# Patient Record
Sex: Male | Born: 1955
Health system: Southern US, Community
[De-identification: ages and names within clinical notes are randomized; demographics above are authoritative.]

## PROBLEM LIST (undated history)

## (undated) DIAGNOSIS — Z720 Tobacco use: Secondary | ICD-10-CM

## (undated) DIAGNOSIS — J84112 Idiopathic pulmonary fibrosis: Secondary | ICD-10-CM

## (undated) DIAGNOSIS — J439 Emphysema, unspecified: Secondary | ICD-10-CM

## (undated) DIAGNOSIS — M1990 Unspecified osteoarthritis, unspecified site: Secondary | ICD-10-CM

## (undated) DIAGNOSIS — E7800 Pure hypercholesterolemia, unspecified: Secondary | ICD-10-CM

## (undated) DIAGNOSIS — M549 Dorsalgia, unspecified: Secondary | ICD-10-CM

## (undated) DIAGNOSIS — G8929 Other chronic pain: Secondary | ICD-10-CM

## (undated) HISTORY — PX: APPENDECTOMY: SHX54

## (undated) HISTORY — DX: Idiopathic pulmonary fibrosis: J84.112

## (undated) HISTORY — PX: NECK SURGERY: SHX720

## (undated) HISTORY — PX: BACK SURGERY: SHX140

---

## 1997-10-25 ENCOUNTER — Ambulatory Visit (HOSPITAL_COMMUNITY): Admission: RE | Admit: 1997-10-25 | Discharge: 1997-10-25 | Payer: Self-pay | Admitting: *Deleted

## 1997-11-14 ENCOUNTER — Inpatient Hospital Stay (HOSPITAL_COMMUNITY): Admission: RE | Admit: 1997-11-14 | Discharge: 1997-11-15 | Payer: Self-pay | Admitting: *Deleted

## 1997-12-10 ENCOUNTER — Other Ambulatory Visit: Admission: RE | Admit: 1997-12-10 | Discharge: 1997-12-10 | Payer: Self-pay | Admitting: *Deleted

## 1998-01-08 ENCOUNTER — Emergency Department (HOSPITAL_COMMUNITY): Admission: EM | Admit: 1998-01-08 | Discharge: 1998-01-08 | Payer: Self-pay | Admitting: Emergency Medicine

## 1998-02-01 ENCOUNTER — Emergency Department (HOSPITAL_COMMUNITY): Admission: EM | Admit: 1998-02-01 | Discharge: 1998-02-01 | Payer: Self-pay | Admitting: Emergency Medicine

## 1998-03-10 ENCOUNTER — Encounter: Admission: RE | Admit: 1998-03-10 | Discharge: 1998-06-05 | Payer: Self-pay | Admitting: Anesthesiology

## 1998-06-05 ENCOUNTER — Encounter: Admission: RE | Admit: 1998-06-05 | Discharge: 1998-08-22 | Payer: Self-pay | Admitting: Anesthesiology

## 1999-03-31 ENCOUNTER — Emergency Department (HOSPITAL_COMMUNITY): Admission: EM | Admit: 1999-03-31 | Discharge: 1999-03-31 | Payer: Self-pay | Admitting: Emergency Medicine

## 1999-04-01 ENCOUNTER — Encounter: Payer: Self-pay | Admitting: Emergency Medicine

## 1999-04-01 ENCOUNTER — Emergency Department (HOSPITAL_COMMUNITY): Admission: EM | Admit: 1999-04-01 | Discharge: 1999-04-01 | Payer: Self-pay | Admitting: Emergency Medicine

## 2000-07-25 ENCOUNTER — Observation Stay (HOSPITAL_COMMUNITY): Admission: RE | Admit: 2000-07-25 | Discharge: 2000-07-26 | Payer: Self-pay | Admitting: Neurosurgery

## 2000-08-30 ENCOUNTER — Encounter: Admission: RE | Admit: 2000-08-30 | Discharge: 2000-08-30 | Payer: Self-pay | Admitting: Neurosurgery

## 2000-10-28 ENCOUNTER — Encounter: Admission: RE | Admit: 2000-10-28 | Discharge: 2000-10-28 | Payer: Self-pay | Admitting: Neurosurgery

## 2001-12-26 ENCOUNTER — Encounter: Payer: Self-pay | Admitting: Family Medicine

## 2001-12-26 ENCOUNTER — Ambulatory Visit (HOSPITAL_COMMUNITY): Admission: RE | Admit: 2001-12-26 | Discharge: 2001-12-26 | Payer: Self-pay | Admitting: Family Medicine

## 2002-01-19 ENCOUNTER — Encounter: Payer: Self-pay | Admitting: Neurosurgery

## 2002-01-19 ENCOUNTER — Ambulatory Visit (HOSPITAL_COMMUNITY): Admission: RE | Admit: 2002-01-19 | Discharge: 2002-01-19 | Payer: Self-pay | Admitting: Neurosurgery

## 2003-05-14 ENCOUNTER — Ambulatory Visit (HOSPITAL_COMMUNITY): Admission: RE | Admit: 2003-05-14 | Discharge: 2003-05-14 | Payer: Self-pay | Admitting: Family Medicine

## 2004-01-23 ENCOUNTER — Ambulatory Visit (HOSPITAL_COMMUNITY): Admission: RE | Admit: 2004-01-23 | Discharge: 2004-01-23 | Payer: Self-pay | Admitting: Family Medicine

## 2004-04-07 ENCOUNTER — Observation Stay (HOSPITAL_COMMUNITY): Admission: EM | Admit: 2004-04-07 | Discharge: 2004-04-08 | Payer: Self-pay | Admitting: *Deleted

## 2004-04-07 ENCOUNTER — Ambulatory Visit: Payer: Self-pay | Admitting: Orthopedic Surgery

## 2004-12-22 ENCOUNTER — Ambulatory Visit (HOSPITAL_COMMUNITY): Admission: RE | Admit: 2004-12-22 | Discharge: 2004-12-22 | Payer: Self-pay | Admitting: Family Medicine

## 2005-09-19 ENCOUNTER — Emergency Department (HOSPITAL_COMMUNITY): Admission: EM | Admit: 2005-09-19 | Discharge: 2005-09-20 | Payer: Self-pay | Admitting: Emergency Medicine

## 2006-11-29 ENCOUNTER — Emergency Department (HOSPITAL_COMMUNITY): Admission: EM | Admit: 2006-11-29 | Discharge: 2006-11-29 | Payer: Self-pay | Admitting: Emergency Medicine

## 2007-07-26 ENCOUNTER — Ambulatory Visit (HOSPITAL_COMMUNITY): Admission: RE | Admit: 2007-07-26 | Discharge: 2007-07-26 | Payer: Self-pay | Admitting: Family Medicine

## 2009-12-22 ENCOUNTER — Ambulatory Visit (HOSPITAL_COMMUNITY): Admission: RE | Admit: 2009-12-22 | Discharge: 2009-12-22 | Payer: Self-pay | Admitting: Family Medicine

## 2010-01-30 ENCOUNTER — Emergency Department (HOSPITAL_COMMUNITY): Admission: EM | Admit: 2010-01-30 | Discharge: 2010-01-30 | Payer: Self-pay | Admitting: Emergency Medicine

## 2010-03-31 ENCOUNTER — Ambulatory Visit (HOSPITAL_COMMUNITY): Admission: RE | Admit: 2010-03-31 | Discharge: 2010-03-31 | Payer: Self-pay | Admitting: Family Medicine

## 2010-04-06 ENCOUNTER — Ambulatory Visit (HOSPITAL_COMMUNITY): Admission: RE | Admit: 2010-04-06 | Discharge: 2010-04-06 | Payer: Self-pay | Admitting: Family Medicine

## 2010-04-13 ENCOUNTER — Ambulatory Visit (HOSPITAL_COMMUNITY): Admission: RE | Admit: 2010-04-13 | Discharge: 2010-04-13 | Payer: Self-pay | Admitting: Family Medicine

## 2010-08-26 LAB — GLUCOSE, CAPILLARY: Glucose-Capillary: 108 mg/dL — ABNORMAL HIGH (ref 70–99)

## 2010-08-27 LAB — CBC
HCT: 43.5 % (ref 39.0–52.0)
Hemoglobin: 14.9 g/dL (ref 13.0–17.0)
MCH: 33.1 pg (ref 26.0–34.0)
MCHC: 34.3 g/dL (ref 30.0–36.0)
MCV: 96.4 fL (ref 78.0–100.0)
Platelets: 218 10*3/uL (ref 150–400)
RBC: 4.51 MIL/uL (ref 4.22–5.81)
RDW: 13.8 % (ref 11.5–15.5)
WBC: 12.6 10*3/uL — ABNORMAL HIGH (ref 4.0–10.5)

## 2010-08-27 LAB — BASIC METABOLIC PANEL
BUN: 9 mg/dL (ref 6–23)
CO2: 29 mEq/L (ref 19–32)
Calcium: 9 mg/dL (ref 8.4–10.5)
Chloride: 101 mEq/L (ref 96–112)
Creatinine, Ser: 0.98 mg/dL (ref 0.4–1.5)
GFR calc Af Amer: >60 mL/min (ref >60)
GFR calc non Af Amer: >60 mL/min (ref >60)
Glucose, Bld: 113 mg/dL — ABNORMAL HIGH (ref 70–99)
Potassium: 3.8 mEq/L (ref 3.5–5.1)
Sodium: 136 mEq/L (ref 135–145)

## 2010-08-27 LAB — DIFFERENTIAL
Basophils Absolute: 0.1 10*3/uL (ref 0.0–0.1)
Basophils Relative: 1 % (ref 0–1)
Eosinophils Absolute: 0.3 10*3/uL (ref 0.0–0.7)
Eosinophils Relative: 3 % (ref 0–5)
Lymphocytes Relative: 15 % (ref 12–46)
Lymphs Abs: 1.8 10*3/uL (ref 0.7–4.0)
Monocytes Absolute: 1.4 10*3/uL — ABNORMAL HIGH (ref 0.1–1.0)
Monocytes Relative: 11 % (ref 3–12)
Neutro Abs: 9 10*3/uL — ABNORMAL HIGH (ref 1.7–7.7)
Neutrophils Relative %: 71 % (ref 43–77)

## 2010-08-27 LAB — URINALYSIS, ROUTINE W REFLEX MICROSCOPIC
Glucose, UA: NEGATIVE mg/dL
Hgb urine dipstick: NEGATIVE
Ketones, ur: NEGATIVE mg/dL
Protein, ur: NEGATIVE mg/dL

## 2010-08-27 LAB — POCT CARDIAC MARKERS
CKMB, poc: <1 ng/mL — ABNORMAL LOW (ref 1.0–8.0)
Myoglobin, poc: 49.3 ng/mL (ref 12–200)
Myoglobin, poc: 84.1 ng/mL (ref 12–200)
Troponin i, poc: <0.05 ng/mL (ref 0.00–0.09)
Troponin i, poc: <0.05 ng/mL (ref 0.00–0.09)

## 2010-10-30 NOTE — Op Note (Signed)
[place]. [facility]  Patient:    Jordan May, Jordan May                         MRN: [id number] Proc. Date: 07/25/00 Adm. Date:  [id number] Disc. Date: [id number] Attending:  Tressie Stalker D                           Operative Report  PREOPERATIVE DIAGNOSIS:  C6-7 herniated nucleus pulposus, degenerative disk disease, spinal stenosis, spondylosis.  POSTOPERATIVE DIAGNOSIS:  C6-7 herniated nucleus pulposus, degenerative disk disease, spinal stenosis, spondylosis.  OPERATION PERFORMED:  C6-7 extensive anterior cervical diskectomy, anterior interbody iliac crest allograft arthrodesis, anterior cervical plating (Codman titanium plate and screws).  SURGEON:  Cristi Loron, M.D.  ASSISTANT:  Danae Orleans. Venetia Maxon, M.D.  ANESTHESIA:  General endotracheal.  ESTIMATED BLOOD LOSS:  Less than 100 cc.  SPECIMENS:  None.  DRAINS:  None.  COMPLICATIONS:  None.  INDICATIONS FOR PROCEDURE:  The patient is a 55 year old white male who suffered from an approximately six-week history of severe left neck and left arm pain.  He failed medical management and was worked up as an outpatient with a cervical MRI demonstrating a large herniated disk at C6-7 on the left. The patients signs, symptoms and physical exam were consistent with a left C-7 radiculopathy.  I therefore discussed the various treatment options with him including doing nothing, continuing medical management and surgery.  The patient weighed the risks, benefits and alternatives of surgery and decided to proceed with C6-7 anterior diskectomy and cervical fusion and plating.  DESCRIPTION OF PROCEDURE:  The patient was brought to the operating room by the anesthesia team.  General endotracheal anesthesia was induced.  The patient remained in the supine position.  A roll was placed under his shoulders to place his neck in slight extension.  His anterior cervical region was then prepared with Betadine scrub and  Betadine solution and sterile drapes were applied.  I then injected the area to be incised with Marcaine with epinephrine solution and used a scalpel to make a left-sided transverse incision in his anterior neck region.  I used the Metzenbaum scissors to dissect down to the platysma muscle and divided it along the direction of the skin incision.  I then dissected medial to the sternocleidomastoid muscle, jugular vein and carotid artery.  I bluntly dissected down to the anterior cervical spine, carefully identified the esophagus retracting it medially.  I cleared the soft tissue from the lower exposed interspace and with the Kittner swabs and inserted a bent spinal needle into the interspace.  I obtained an intraoperative radiograph.  This demonstrated that the needle appeared to be in C7-T1.  I then turned my attention to the next higher intervertebral disk space and inserted another spinal needle there and obtained a second x-ray that demonstrated that I was indeed at C6-7.  I then used electrocautery to detach the medial border of the longus colli muscle bilaterally from the C6-7 intervertebral disk and inserted the Caspar self-retaining retractor for exposure.  I then incised the C6-7 intervertebral disk with a 15 blade scalpel and performed a partial diskectomy with the Carlens curets and the pituitary forceps.  I inserted distraction screws at C6 and C7 distracting the interspace and used a high speed drill to decorticate the vertebral end plates at V7-8 and drill away the remainder of the intervertebral disk.  I thinned out the  posterior longitudinal ligament and incised it with the arachnoid knife.  I then was able to remove several large free fragment disk herniations from the left C6-7 neural foramen to partially decompress the C7 nerve root. I removed the remainder of the posterior longitudinal ligament with the Kerrison punch undercutting the vertebral end plates at X9-1  decompressing the thecal sac.  I performed foraminotomies about the bilateral C7 nerve root.  I then palpated about the thecal sac and bilateral C7 nerve roots and noted that they were well decompressed.  I completed the anterior cervical diskectomy and turned my attention to the arthrodesis.  I obtained an iliac crest tricortical allograft bone graft and fashioned it to these approximate dimensions:  7 mm in height and 1 cm in depth.  I inserted the bone graft and distracted the C6-7 interspace, removed the distraction pins and there was a good snug fit of the bone graft.  Having completed the arthrodesis, I turned my attention to the anterior spinal instrumentation.  I obtained the appropriate length Codman anterior cervical plate, laid it along the anterior aspect of C6 and 7, drilled two holes in C6, two in C7, tapped these holes and secured the plate to the vertebral bodies with 15 mm screws.  I then obtained an intraoperative radiograph and demonstrated good position of at least the upper screws.  I could not see the lower screws very well because of the patients shoulders but they looked good in vivo.  I then secured the screws to the plate using the Cam tightener and then I copiously irrigated the wound with bacitracin solution, removed the solution.  I then achieved stringent hemostasis with bipolar electrocautery. I removed the Caspar self-retaining retractor.  I then inspected the esophagus for any damage, there was none.  I then reapproximated the patients platysma muscle with interrupted 3-0 Vicryl suture, the subcutaneous tissues with interrupted 3-0 Vicryl suture and the skin with Steri-Strips and benzoin.  The wound was then coated with bacitracin ointment and sterile dressing was applied.  The drapes were removed.  The patient was subsequently extubated by the anesthesia team and transported to the post anesthesia care unit in stable condition.  All sponge, needle and  instrument counts were correct at the end of this case. DD:  07/25/00 TD:  07/26/00 Job: [id number]

## 2010-10-30 NOTE — Discharge Summary (Signed)
NAME:  Jordan May, Jordan May NO.:  [id number]   MEDICAL RECORD NO.:  [id number]          PATIENT TYPE:  INP   LOCATION:  A306                          FACILITY:  [facility]   PHYSICIAN:  Corrie Mckusick, M.D.  DATE OF BIRTH:  [birth year]   DATE OF ADMISSION:  04/07/2004  DATE OF DISCHARGE:  10/26/2005LH                                 DISCHARGE SUMMARY   HISTORY OF PRESENT ILLNESS AND PAST MEDICAL HISTORY:  Please see admission  H&P.   HOSPITAL COURSE:  A 55 year old gentleman who came in with what was thought  to be a possible left hand cellulitis versus gout who had complete  resolution of symptoms overnight with Indocin, colchicine, and antibiotics.  Suspected that he had gouty arthritis as he had a flare up of arthritis in  his right elbow about a month ago.  He does have some metallic foreign body  in the soft tissue, anterior to the fourth metacarpal which, I suspect, has  been there for many, many years.  Orthopedics was consulted, but has not  seen the patient as of yet.  They will see the patient prior to discharge to  get their opinion.  As long as they agree that discharge is warranted, we  will do so.   DISCHARGE PHYSICAL:  He is afebrile, vital signs stable.  Please see  progress note for details.  Left hand:  There was complete resolution of  warmth, redness, swelling, full range of motion of all of his digits, now  looks like a completely different left hand.   DISCHARGE CONDITION:  Improved and stable.   DISCHARGE MEDICATIONS:  1.  Levaquin 750 mg daily for 5 days.  2.  Indocin 50 mg p.o. t.i.d. p.r.n.  3.  Colchicine 0.6 mg p.o. q.3h. p.r.n.   FOLLOW UP:  He is going to follow up in 2 days or sooner if need be; and,  again, patient is going to see Dr. Romeo Apple prior to discharge.     Clemetine Marker  D:  04/08/2004  T:  04/08/2004  Job:  [id number]

## 2010-10-30 NOTE — H&P (Signed)
NAME:  Jordan May, Jordan May NO.:  [id number]   MEDICAL RECORD NO.:  [id number]          PATIENT TYPE:  INP   LOCATION:  A306                          FACILITY:  [facility]   PHYSICIAN:  Corrie Mckusick, M.D.  DATE OF BIRTH:  Dec 08, 1955   DATE OF ADMISSION:  04/07/2004  DATE OF DISCHARGE:  LH                                HISTORY & PHYSICAL   ADMITTING DIAGNOSIS:  Cellulitis of the hand.   PRIMARY CARE PHYSICIAN:  [facility]   HISTORY OF PRESENTING ILLNESS:  A 55 year old gentleman with history of  hyperlipidemia and degenerative disk disease who has had 2-3 days now of  right hand swelling and warmth.  No fevers, chills, or other complaints. He  had no bites or other infectious cause.  He does have some nicks on his  first MP joint, but otherwise really no cuts or otherwise sources of  infection. He has no history of gout, just the degenerative disk disease.  He came into the emergency department for further evaluation and on x-ray  was found to have a metallic foreign body; he is not sure how that arrived  in his hand. It was decided to admit him for further observation and IV  antibiotics.   PAST MEDICAL HISTORY:  Hyperlipidemia, degenerative disk disease.   PAST SURGICAL HISTORY:  Back surgery many years ago on his lumbar spine as  well as cervical disk surgery in February 2002.   MEDICATIONS:  None.   ALLERGIES:  CODEINE and PENICILLIN.   FAMILY HISTORY:  Significant for throat cancer and COPD, as well as coronary  disease.   SOCIAL HISTORY:  No alcohol or illicit drug use.  Has smoked 1 pack a day  for many years.  He is on disability for his low back and cervical spine  injury.   PHYSICAL EXAMINATION:  VITAL SIGNS:  Temperature 97.8, blood pressure  123/79, pulse 83, respirations 20.  When I saw the patient he was pleasant,  joking and was on his way to smoke a cigarette.  HEENT:  Nasopharynx clear.  NECK:  Supple.  No lymphadenopathy.  CHEST:  Clear to  auscultation bilaterally.  CARDIOVASCULAR:  Regular rate and rhythm.  No murmurs.  ABDOMEN:  Soft and nontender.  EXTREMITIES:  Lower extremities have no edema.  His left wrist and hand are  quite swollen; and, as stated above, there is a small laceration at the  first MP joint.  The swelling is actually greatly improved from early this  morning, and this is probably after the antibiotic.   X-RAYS:  Show no evidence of acute fracture or dislocation.  There is a  small metallic foreign body on the palmar soft tissues anterior to the  proximal fourth metacarpal nodes.   LABS:  Showed a uric acid of 5.1, sodium 133, potassium 3.3, Chem-7  otherwise normal.  White count elevated at 14.3, hemoglobin of 16.0,  hematocrit of 47.1, platelets of 320.   ASSESSMENT:  A 55 year old gentleman with left hand cellulitis; possible  gout, but it looks more cellulitic.   PLAN:  1.  Admit to 3A for monitoring and IV antibiotics.  We will use      fluoroquinolone at high doses, 750 mg IV daily, due to his PENICILLIN      allergy.  2.  Elevate hand.  3.  Pain management.  4.  Consult orthopedics.  5.  Go ahead and cover with Indocin 50 mg t.i.d. as well as colchicine 0.6      mg q.i.d.  6.  Blood culture x2.  Will continue to follow closely.     Clemetine Marker  D:  04/07/2004  T:  04/07/2004  Job:  [id number]

## 2010-10-30 NOTE — Consult Note (Signed)
NAME:  Jordan May, Jordan May NO.:  [id number]   MEDICAL RECORD NO.:  [id number]          PATIENT TYPE:  INP   LOCATION:  A306                          FACILITY:  [facility]   PHYSICIAN:  Vickki Hearing, M.D.DATE OF BIRTH:  August 09, 1955   DATE OF CONSULTATION:  DATE OF DISCHARGE:                                   CONSULTATION   REQUESTING PHYSICIAN:  Corrie Mckusick, M.D.   REASON FOR CONSULTATION:  Swelling and pain of the hand.   HISTORY:  Dr. Phillips Odor has dictated a detailed history and physical.  I have  reviewed it.  Basically, this is a 55 year old man with two to three days of  swelling and pain and warmth in his right hand.  He denies any trauma.  There is no sign of break in the skin from infectious cause such as an  insect bite.  He does have a foreign body in his hand.  He does not know how  it got there.  It is probably old.   PAST MEDICAL HISTORY:  Recorded in the medical record.   PAST SURGICAL HISTORY:  Recorded in the medical record.  Most notable,  patient had a history of back surgery and cervical disk surgery.   REVIEW OF SYSTEMS:  Recorded in the medical record.   Other notes are in the medical record and incorporated by reference.   PHYSICAL EXAMINATION:  VITAL SIGNS:  He is afebrile.  Vital signs are  stable.  CARDIOVASCULAR:  Normal.  LYMPH NODES:  Normal.  SKIN:  There were some nicks and small cuts/abrasions on the skin, but  nothing that would cause this kind of pain or swelling.  EXTREMITIES:  He has swelling of the hand and wrist, painful range of  motion.  There is a questionable laceration at the first metacarpal  phalangeal joint.  He has been on antibiotics for 24 hours and therefore his  swelling is considerably less than it was.  There is no lymphangitis type  streaking.  NEUROLOGIC:  He is neurovascularly intact, awake, and alert.   X-rays show soft tissue swelling, no signs of osteomyelitis.  Uric acid  showed 5.1.  White count  14.3, but no left shift.   IMPRESSION:  Cellulitis.  Differential diagnosis:  Gout, septic arthritis,  and osteomyelitis.   I agree with antibiotic coverage as indicated by Dr. Phillips Odor and pain  management, elevation as he is doing is appropriate and coverage for gout is  appropriate as well.  I will continue to follow as well.     Weyman Croon   SEH/MEDQ  D:  04/08/2004  T:  04/08/2004  Job:  [id number]

## 2010-10-30 NOTE — Discharge Summary (Signed)
[place]. [facility]  Patient:    Jordan May, Jordan May                         MRN: [id number] Adm. Date:  [id number] Disc. Date: [id number] Attending:  Tressie Stalker D                           Discharge Summary  For full details of this admission, please refer to typed history and physical.  BRIEF HISTORY:  The patient is a 55 year old white male who suffers from neck and left arm pain. He failed medical management and was worked up with a cervical MRI that demonstrated a herniated disc at C6-7 on the left and he, therefore, weighed the risks, benefits, and alternatives to surgery and decided to proceed with an anterior cervical discectomy, with fusion and  and plating.  For past medical history, past surgical history, medications prior to admission, drug allergies, family medical history, social history, review of systems, admission physical examination, admission status, assessment and plan, etc., please refer to typed history and physical.  HOSPITAL COURSE:  I performed a C6-7 anterior cervical discectomy, inner body iliac crest arthrodesis, anterior cervical plating on the patient on July 25, 2000, without complications (for full details of this operation, please refer to typed operative note).  POSTOPERATIVE COURSE:  The patients postoperative course is unremarkable.  By postoperative day #1 he was eating well and ambulating well.  His wound was healing well without signs of infection. There was no hematoma and midline shift.  He had no motor strength and his arm pain was gone.  He was eating well.  He requested discharge to home.  I therefore discharged him on on July 26, 2000.  DISCHARGE INSTRUCTIONS:  The patient was given written discharge instructions.  DISCHARGE MEDICATIONS: 1. Tylox #60 one to two p.o. q.4h. p.r.n. for pain, limit eight per day,    no refills. 2. Valium 5 mg #40 one p.o. q.6h. p.r.n. for muscle spasm, one  refill.  FINAL DIAGNOSES: 1. C6-7 herniated nucleus pulposus. 2. Degenerative disc disease. 3. Spinal stenosis. 4. Cervical radiculopathy. 5. Spondylosis.  PROCEDURE PERFORMED:  C6-7 extensive anterior cervical discectomy, inner body iliac crest allograft arthrodesis, anterior cervical plating C6-7 (Codman). DD:  07/26/00 TD:  07/26/00 Job: [id number]

## 2010-10-30 NOTE — H&P (Signed)
[place]. [facility]  Patient:    Jordan May, Jordan May                           MRN: [id number] Adm. Date:  07/25/00 Attending:  Cristi Loron, M.D.                         History and Physical  CHIEF COMPLAINT:  Left arm pain.  HISTORY OF PRESENT ILLNESS:  The patient is a 55 year old white male who began having severe neck and left arm pain approximately six weeks ago.  He was initially worked up by his primary doctor and thought that he might be having some cardiac problem.  He has seen a cardiologist Dr. Nanetta Batty who worked him up with EKG and stress test which were ok by his report, but suspected cervical radiculopathy and sent for cervical MRI which demonstrated a herniated disc.  He was kindly sent for my consultation.  The patient complains of severe left sided neck pain radiating down his left arm associated with numbness, paresthesia and weakness in his left triceps. His discomfort is not exertional related.  He has not had any shortness of breath.  He has been treated with Duragesic patch among other medications.  PAST MEDICAL HISTORY:  Positive for hypercholesterolemia, ruptured lumbar disc.  SURGICAL HISTORY:  The patient is status post lumbar laminectomy in March of 1999.  He has had back and leg trouble since.  MEDICATIONS PRIOR TO ADMISSION: 1. Prednisone 5 mg p.o. b.i.d. 2. Oxycodone p.r.n. 3. Duragesic patch 25 mcg per hour.  One tablet q 3 days. 4. Valium 5 mg p.o. q 6 hours p.r.n. for muscle spasm.  DRUG ALLERGIES:  Codeine causes nausea.  FAMILY MEDICAL HISTORY:  The patients mother is age 42 in good health.  The patients father died age 40 of lung cancer.  SOCIAL HISTORY:  The patient is married and has one son.  He lives in [place].  He is disabled from his back troubles.  He smokes one and a half packs a day of cigarettes for approximately 33 years.  I highly advised him to quit smoking.  He denies ethanol and drug  use.  REVIEW OF SYSTEMS:  Negative except as above.  PHYSICAL EXAMINATION:  GENERAL:  This is a pleasant 55 year old white male complaining of severe left arm pain.  VITAL SIGNS:  Height 5 feet 8 inches, weight 154 pounds.  HEENT:  Normal.  NECK:  Supple.  There are no masses or deformities, tracheal deviation, jugular venous distention, carotid bruits.  He has limited cervical range of motion.  Spurlings test was positive on the left and negative on the right. Lhermittes sign was not present.  The thorax is symmetric.  LUNGS:  The lungs are clear to auscultation.  HEART:  Regular rate and rhythm.  ABDOMEN:  Soft and nontender.  EXTREMITIES:  No obvious deformities.  BACK:  Well-healed lumbar incision without signs of infection.  NEUROLOGIC:  The patient is alert and oriented x 3.  Cranial nerves 2-12 are grossly intact bilaterally to light.  Vision and hearing are grossly normal bilaterally.  Motor strength is 5/5 in bilateral deltoid, biceps, wrist extensor, interosseous, psoas, quadriceps, gastrocnemius, extensor hallucis longus, right hand grip and triceps.  His left hand grip and tricep strength is diminished at 4-/5.  Sensory examination demonstrates some decreased sensation in the posterior lateral left upper extremity, otherwise, unremarkable.  Cerebellar examination is intact to rapid alternating movements of the upper extremities bilaterally.  Deep tendon reflexes are 2+ to 3/4 in bilateral biceps, brachioradialis, quadriceps, gastrocnemius and right triceps, 1-2/4 in his left triceps.  He has bilateral flexor plantar reflexes and 4/5 nonsustained ankle clonus bilaterally.  The patient had a cervical MRI performed without contrast at [facility] on 07/15/00 which demonstrates large herniated disk at C6-7 on the left and some mild spondylosis at C5-6.  ASSESSMENT:  C6-7 herniated nucleus pulposus, spinal stenosis, degenerative disk disease,  spondylosis, cervical radiculopathy.  I have discussed the situation with the patient and reviewed his MRI scan with him pointing out abnormalities and is clearly suffering from a left C7 radiculopathy.  I have discussed treatment options with him including doing nothing, continuing medical management and surgery.  I have described both anterior and posterior surgery.  I think he would benefit from C6-7 anterior cervical diskectomy, interbody iliac crest allograft arthrodesis and anterior cervical plating.  I described the surgical procedure to him, showed him surgical models and discussed the risk of surgery extensively.  The patient has weighed the risks, benefits and alternatives of surgery and decided to proceed with the operation on 07/25/00.  CARDIAC WORK UP:  The patient was cleared by Dr. Allyson Sabal. DD:  07/25/00 TD:  07/25/00 Job: [id number]

## 2011-03-31 LAB — COMPREHENSIVE METABOLIC PANEL
AST: 18
Albumin: 3.6
Chloride: 103
Creatinine, Ser: 1.2
GFR calc Af Amer: >60
Potassium: 4
Total Bilirubin: 0.6
Total Protein: 6.5

## 2011-03-31 LAB — DIFFERENTIAL
Basophils Absolute: 0
Eosinophils Relative: 1
Lymphocytes Relative: 18
Monocytes Absolute: 1.4 — ABNORMAL HIGH
Monocytes Relative: 10

## 2011-03-31 LAB — POCT CARDIAC MARKERS: Troponin i, poc: <0.05

## 2011-03-31 LAB — I-STAT 8, (EC8 V) (CONVERTED LAB)
Acid-Base Excess: 2
Chloride: 104
HCT: 47
Hemoglobin: 16
Operator id: 215201
Potassium: 4
Sodium: 136
TCO2: 28

## 2011-03-31 LAB — CBC
MCV: 96.2
Platelets: 280
WBC: 14 — ABNORMAL HIGH

## 2011-03-31 LAB — POCT I-STAT CREATININE: Operator id: 215201

## 2011-07-23 IMAGING — PT NM PET TUM IMG INITIAL (PI) SKULL BASE T - THIGH
6 series · 25 of 25 positions shown · non-contrast
Comparison: Chest CT of 04/06/2010.  Abdominal CT of 07/26/2007.

CLINICAL DATA: Initial treatment strategy for nodules identified on
chest CT.

NUCLEAR MEDICINE PET CT SKULL BASE TO THIGH
TECHNIQUE: 15.9 mCi F-18 FDG was injected intravenously via the
right AC.  Full-ring PET imaging was performed from the skull base
through the mid-thighs 55  minutes after injection.  CT data was
obtained and used for attenuation correction and anatomic
localization only.  (This was not acquired as a diagnostic CT
examination.)
Fasting Blood Glucose:  108

[Series 1: pet ac · axial · 3.3mm · 4.69mm/px · z∈[-870,+0]mm · 5 of 267 slices shown]
[im 1/267]
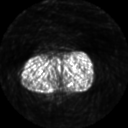
[im 67/267]
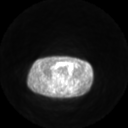
[im 134/267]
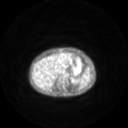
[im 200/267]
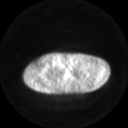
[im 267/267]
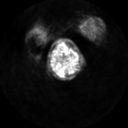

[Series 2: ct images · axial · 3.8mm · 0.98mm/px · z∈[-870,+0]mm · 5 of 267 slices shown]
[im 1/267]
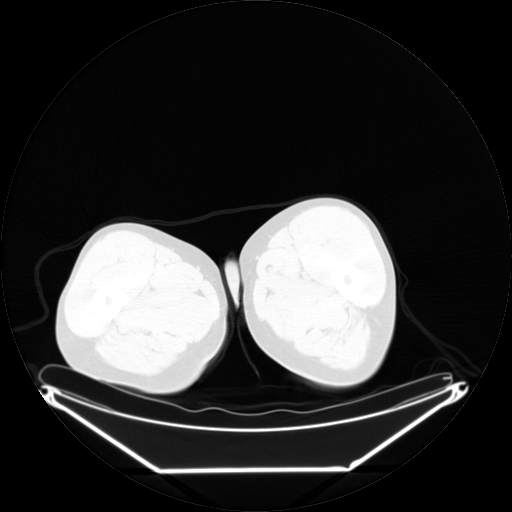
[im 67/267]
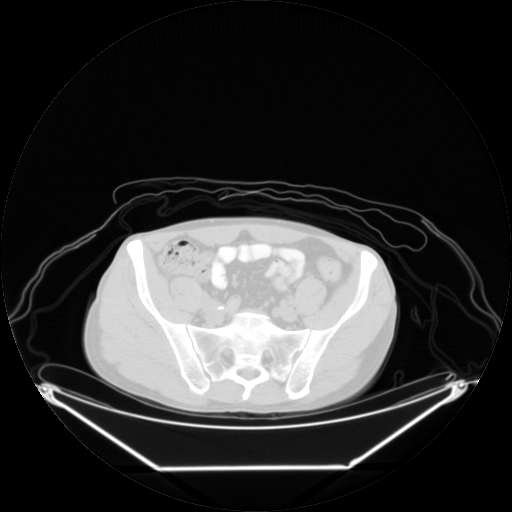
[im 134/267]
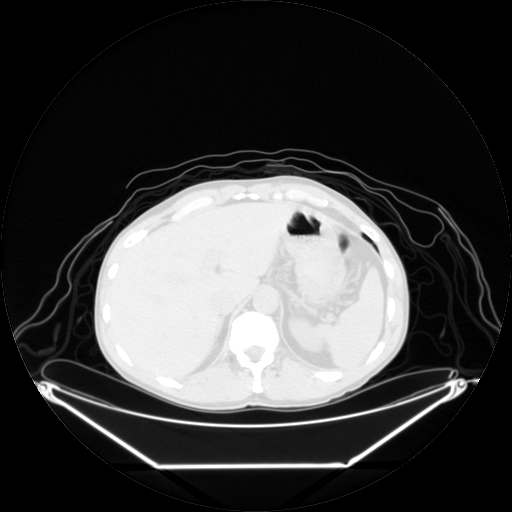
[im 200/267]
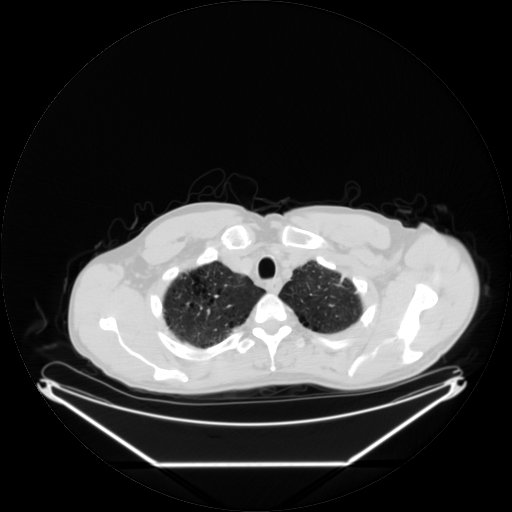
[im 267/267  brain]
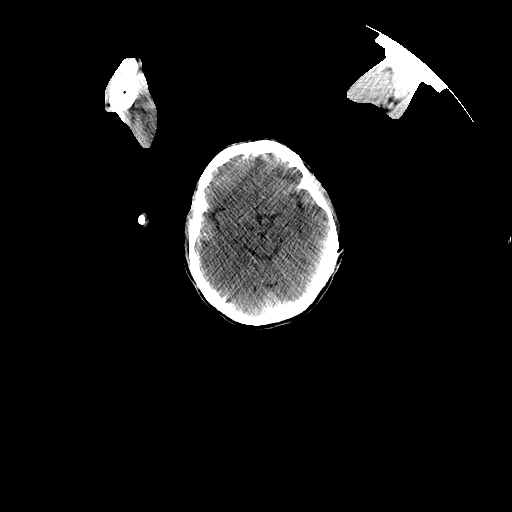

[Series 2: pet nac · axial · 3.3mm · 4.69mm/px · z∈[-870,+0]mm · 6 of 267 slices shown]
[im 1/267]
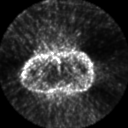
[im 54/267]
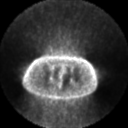
[im 107/267]
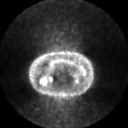
[im 160/267]
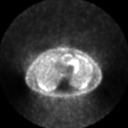
[im 213/267]
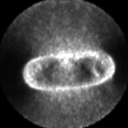
[im 267/267]
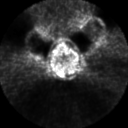

[Series 123: mip · coronal · 3.3mm · 4.69mm/px · 1 of 30 slices shown]
[im 1/30]
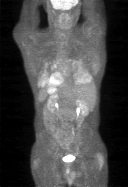

[Series 151: reformatted · axial · 3.3mm · 3.91mm/px · z∈[-870,+0]mm · 6 of 267 slices shown (1 of 2)]
[im 1/267]
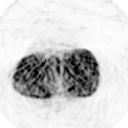
[im 54/267]
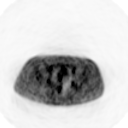
[im 107/267]
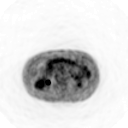
[im 160/267]
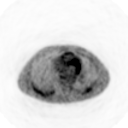
[im 213/267]
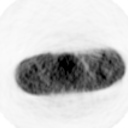
[im 267/267]
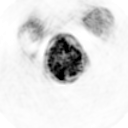

[Series 153: reformatted · coronal · 4.7mm · 6.98mm/px · 2 of 83 slices shown (2 of 2)]
[im 1/83]
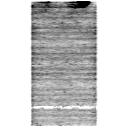
[im 83/83]
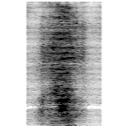

[25 of 25 positions shown; findings below may reference images not displayed]

FINDINGS: PET images demonstrate no abnormal activity within the
neck.  The areas of bilateral, left greater right anterior pleural
thickening are not significantly hypermetabolic.  Areas of
dependent ground-glass opacity (with possible septal thickening on
recent diagnostic CT) demonstrate diffuse mild hypermetabolism.
The partially cavitary left lower lobe nodules described on the
prior exam are within these dependent areas of hypermetabolism and
diffuse pulmonary opacity.

A subtle area of focal septal thickening within the anteromedial
left lower lobe (immediately posterior medial to the left lower
lobe bronchus) demonstrates hypermetabolism.  This measures a
S.U.V. max of 2.4 on image 98.

No abnormal activity within the abdomen or pelvis.

CT images performed for attenuation correction demonstrate no
significant findings in the neck.  There has been prior cervical
spine fixation.

Mildly age advanced coronary artery atherosclerosis.  Moderate
centrilobular emphysema.
IMPRESSION: 1.  No focal pulmonary parenchymal or pleural abnormality to
strongly suggest neoplasia.  Areas of left greater than right
pleural thickening which are favored to be related to prior
infection or inflammation.
2.  Dependent pulmonary opacities with hypermetabolism.  Similar
more focal area in the anterior medial left lower lobe.  Given the
appearance on the prior diagnostic CT, these are suspicious for
interstitial lung disease, such as nonspecific interstitial
pneumonitis.  Consider pulmonary consultation.

3.  The hypermetabolism within the dependent opacities obscures the
described partially cavitary left lower lobe nodules.  These are
favored to be related to infection, including atypical etiologies.
Correlate with infectious symptoms.
4.  Recommend follow-up with chest CT at approximately 3 months to
confirm stability of the pleural parenchymal findings.

## 2012-04-12 ENCOUNTER — Other Ambulatory Visit (HOSPITAL_COMMUNITY): Payer: Self-pay | Admitting: Neurosurgery

## 2012-04-12 DIAGNOSIS — M542 Cervicalgia: Secondary | ICD-10-CM

## 2012-04-17 ENCOUNTER — Ambulatory Visit (HOSPITAL_COMMUNITY)
Admission: RE | Admit: 2012-04-17 | Discharge: 2012-04-17 | Disposition: A | Discharge status: Home / Self Care | Payer: 59 | Source: Ambulatory Visit | Attending: Neurosurgery | Admitting: Neurosurgery

## 2012-04-17 DIAGNOSIS — M5020 Other cervical disc displacement, unspecified cervical region: Secondary | ICD-10-CM | POA: Insufficient documentation

## 2012-04-17 DIAGNOSIS — M542 Cervicalgia: Secondary | ICD-10-CM | POA: Insufficient documentation

## 2012-04-17 DIAGNOSIS — M47812 Spondylosis without myelopathy or radiculopathy, cervical region: Secondary | ICD-10-CM | POA: Insufficient documentation

## 2012-04-17 DIAGNOSIS — M79609 Pain in unspecified limb: Secondary | ICD-10-CM | POA: Insufficient documentation

## 2014-01-31 ENCOUNTER — Other Ambulatory Visit (HOSPITAL_COMMUNITY): Payer: Self-pay | Admitting: Internal Medicine

## 2014-01-31 ENCOUNTER — Ambulatory Visit (HOSPITAL_COMMUNITY)
Admission: RE | Admit: 2014-01-31 | Discharge: 2014-01-31 | Disposition: A | Discharge status: Home / Self Care | Payer: 59 | Source: Ambulatory Visit | Attending: Internal Medicine | Admitting: Internal Medicine

## 2014-01-31 DIAGNOSIS — R079 Chest pain, unspecified: Secondary | ICD-10-CM | POA: Diagnosis present

## 2014-01-31 DIAGNOSIS — J984 Other disorders of lung: Secondary | ICD-10-CM | POA: Diagnosis not present

## 2014-01-31 DIAGNOSIS — J90 Pleural effusion, not elsewhere classified: Secondary | ICD-10-CM | POA: Diagnosis not present

## 2014-02-07 ENCOUNTER — Other Ambulatory Visit (HOSPITAL_COMMUNITY): Payer: Self-pay | Admitting: Internal Medicine

## 2014-02-07 DIAGNOSIS — R079 Chest pain, unspecified: Secondary | ICD-10-CM

## 2014-02-07 DIAGNOSIS — R9389 Abnormal findings on diagnostic imaging of other specified body structures: Secondary | ICD-10-CM

## 2014-02-11 ENCOUNTER — Ambulatory Visit (HOSPITAL_COMMUNITY)
Admission: RE | Admit: 2014-02-11 | Discharge: 2014-02-11 | Disposition: A | Discharge status: Home / Self Care | Payer: 59 | Source: Ambulatory Visit | Attending: Internal Medicine | Admitting: Internal Medicine

## 2014-02-11 DIAGNOSIS — R079 Chest pain, unspecified: Secondary | ICD-10-CM | POA: Insufficient documentation

## 2014-02-11 DIAGNOSIS — R918 Other nonspecific abnormal finding of lung field: Secondary | ICD-10-CM | POA: Diagnosis not present

## 2014-02-11 DIAGNOSIS — R9389 Abnormal findings on diagnostic imaging of other specified body structures: Secondary | ICD-10-CM | POA: Diagnosis not present

## 2014-02-13 ENCOUNTER — Emergency Department (HOSPITAL_COMMUNITY)
Admission: EM | Admit: 2014-02-13 | Discharge: 2014-02-13 | Disposition: A | Discharge status: Home / Self Care | Payer: 59 | Attending: Emergency Medicine | Admitting: Emergency Medicine

## 2014-02-13 ENCOUNTER — Encounter (HOSPITAL_COMMUNITY): Payer: Self-pay | Admitting: Emergency Medicine

## 2014-02-13 ENCOUNTER — Emergency Department (HOSPITAL_COMMUNITY): Payer: 59

## 2014-02-13 DIAGNOSIS — M79609 Pain in unspecified limb: Secondary | ICD-10-CM | POA: Insufficient documentation

## 2014-02-13 DIAGNOSIS — M5412 Radiculopathy, cervical region: Secondary | ICD-10-CM

## 2014-02-13 DIAGNOSIS — Z880 Allergy status to penicillin: Secondary | ICD-10-CM | POA: Diagnosis not present

## 2014-02-13 DIAGNOSIS — G8929 Other chronic pain: Secondary | ICD-10-CM | POA: Diagnosis not present

## 2014-02-13 DIAGNOSIS — E7800 Pure hypercholesterolemia, unspecified: Secondary | ICD-10-CM | POA: Diagnosis not present

## 2014-02-13 DIAGNOSIS — M5030 Other cervical disc degeneration, unspecified cervical region: Secondary | ICD-10-CM | POA: Insufficient documentation

## 2014-02-13 DIAGNOSIS — F17200 Nicotine dependence, unspecified, uncomplicated: Secondary | ICD-10-CM | POA: Diagnosis not present

## 2014-02-13 DIAGNOSIS — Z791 Long term (current) use of non-steroidal anti-inflammatories (NSAID): Secondary | ICD-10-CM | POA: Diagnosis not present

## 2014-02-13 DIAGNOSIS — Z79899 Other long term (current) drug therapy: Secondary | ICD-10-CM | POA: Diagnosis not present

## 2014-02-13 HISTORY — DX: Pure hypercholesterolemia, unspecified: E78.00

## 2014-02-13 HISTORY — DX: Other chronic pain: G89.29

## 2014-02-13 HISTORY — DX: Dorsalgia, unspecified: M54.9

## 2014-02-13 LAB — COMPREHENSIVE METABOLIC PANEL
ALT: 7 U/L (ref 0–53)
AST: 14 U/L (ref 0–37)
Albumin: 3 g/dL — ABNORMAL LOW (ref 3.5–5.2)
Alkaline Phosphatase: 103 U/L (ref 39–117)
Anion gap: 11 (ref 5–15)
BILIRUBIN TOTAL: 0.2 mg/dL — AB (ref 0.3–1.2)
BUN: 8 mg/dL (ref 6–23)
CHLORIDE: 99 meq/L (ref 96–112)
CO2: 29 meq/L (ref 19–32)
CREATININE: 1.11 mg/dL (ref 0.50–1.35)
Calcium: 9.2 mg/dL (ref 8.4–10.5)
GFR calc Af Amer: 82 mL/min — ABNORMAL LOW (ref >90)
GFR, EST NON AFRICAN AMERICAN: 70 mL/min — AB (ref >90)
Glucose, Bld: 94 mg/dL (ref 70–99)
Potassium: 4.3 mEq/L (ref 3.7–5.3)
Sodium: 139 mEq/L (ref 137–147)
Total Protein: 7.1 g/dL (ref 6.0–8.3)

## 2014-02-13 LAB — CBC WITH DIFFERENTIAL/PLATELET
BASOS ABS: 0.1 10*3/uL (ref 0.0–0.1)
Basophils Relative: 1 % (ref 0–1)
Eosinophils Absolute: 0.3 10*3/uL (ref 0.0–0.7)
Eosinophils Relative: 3 % (ref 0–5)
HEMATOCRIT: 42.2 % (ref 39.0–52.0)
HEMOGLOBIN: 15.2 g/dL (ref 13.0–17.0)
LYMPHS ABS: 3.4 10*3/uL (ref 0.7–4.0)
LYMPHS PCT: 32 % (ref 12–46)
MCH: 33.7 pg (ref 26.0–34.0)
MCHC: 36 g/dL (ref 30.0–36.0)
MCV: 93.6 fL (ref 78.0–100.0)
MONO ABS: 1.2 10*3/uL — AB (ref 0.1–1.0)
MONOS PCT: 11 % (ref 3–12)
NEUTROS ABS: 5.7 10*3/uL (ref 1.7–7.7)
Neutrophils Relative %: 53 % (ref 43–77)
Platelets: 387 10*3/uL (ref 150–400)
RBC: 4.51 MIL/uL (ref 4.22–5.81)
RDW: 12.9 % (ref 11.5–15.5)
WBC: 10.7 10*3/uL — AB (ref 4.0–10.5)

## 2014-02-13 MED ORDER — ONDANSETRON HCL 4 MG/2ML IJ SOLN: 4.0000 mg | Freq: Once | INTRAMUSCULAR | Status: DC

## 2014-02-13 MED ORDER — ONDANSETRON HCL 4 MG/2ML IJ SOLN
4.0000 mg | Freq: Once | INTRAMUSCULAR | Status: AC
  Administered 2014-02-13: 4 mg via INTRAVENOUS
  Filled 2014-02-13: qty 2

## 2014-02-13 MED ORDER — ONDANSETRON HCL 4 MG/2ML IJ SOLN
4.0000 mg | Freq: Once | INTRAMUSCULAR | Status: DC
  Filled 2014-02-13: qty 2

## 2014-02-13 MED ORDER — HYDROMORPHONE HCL PF 1 MG/ML IJ SOLN
1.0000 mg | Freq: Once | INTRAMUSCULAR | Status: AC
  Administered 2014-02-13: 1 mg via INTRAVENOUS
  Filled 2014-02-13: qty 1

## 2014-02-13 MED ORDER — SODIUM CHLORIDE 0.9 % IV SOLN
Freq: Once | INTRAVENOUS | Status: AC
Start: 2014-02-13 — End: 2014-02-13
  Administered 2014-02-13: 14:00:00 via INTRAVENOUS

## 2014-02-13 MED ORDER — ONDANSETRON HCL 4 MG/2ML IJ SOLN
4.0000 mg | Freq: Once | INTRAMUSCULAR | Status: AC
  Administered 2014-02-13: 4 mg via INTRAVENOUS

## 2014-02-13 NOTE — ED Notes (Signed)
nad noted prior to dc. Dc instructions reviewed with pt and explained. Voiced understanding.

## 2014-02-13 NOTE — ED Provider Notes (Signed)
CSN: [id number]     Arrival date & time 02/13/14  1253 History   First MD Initiated Contact with Patient 02/13/14 1312     Chief Complaint  Patient presents with  . Arm Pain     (Consider location/radiation/quality/duration/timing/severity/associated sxs/prior Treatment) HPI Comments: Jordan May is a patient of Dr Gerarda Fraction. He states that he has been having problems with pain under his right arm for approximately 5 weeks. He states that in the past he had a chest x-ray did show some abnormality. He had a CT scan on Monday was unable to reach his primary care person or the specialist that has been referred to. He states that he was treated with hydrocodone 10 mg with pain, but states this is not helping at all. His wife says that he has been up" all night long" complaining of pain with even the least movement of his arm. He came to the emergency department because he" didn't know what else to do but he did not reach his primary care physician." He has not had any high fever. He's not noticed any rash involving the under arm area chest area or the arm area on the right. He's not had any injury or trauma to the area. He has had back surgery and neck surgery in the past, but states these have been several years ago. He states he's been told that he had some arthritis all over his body the this has not been pursued actively.  The history is provided by the patient.    Past Medical History  Diagnosis Date  . Chronic back pain   . Hypercholesterolemia    Past Surgical History  Procedure Laterality Date  . Back surgery    . Neck surgery    . Appendectomy     No family history on file. History  Substance Use Topics  . Smoking status: Current Every Day Smoker    Types: Cigarettes  . Smokeless tobacco: Not on file  . Alcohol Use: No    Review of Systems  Constitutional: Negative for activity change.       All ROS Neg except as noted in HPI  HENT: Negative for nosebleeds.   Eyes: Negative for  photophobia and discharge.  Respiratory: Negative for cough, shortness of breath and wheezing.   Cardiovascular: Negative for chest pain and palpitations.  Gastrointestinal: Negative for abdominal pain and blood in stool.  Genitourinary: Negative for dysuria, frequency and hematuria.  Musculoskeletal: Positive for back pain, neck pain and neck stiffness. Negative for arthralgias.  Skin: Negative.   Neurological: Negative for dizziness, seizures and speech difficulty.  Psychiatric/Behavioral: Negative for hallucinations and confusion.      Allergies  Oxycodone and Penicillins  Home Medications   Prior to Admission medications   Medication Sig Start Date End Date Taking? Authorizing Provider  diazepam (VALIUM) 10 MG tablet Take 10 mg by mouth every 6 (six) hours as needed. Muscle spasm 01/31/14  Yes Historical Provider, MD  Diclofenac Sodium POWD Apply 1 application topically 4 (four) times daily. 02/05/14  Yes Historical Provider, MD  HYDROcodone-acetaminophen (NORCO) 10-325 MG per tablet Take 1 tablet by mouth every 6 (six) hours as needed. pain 01/31/14  Yes Historical Provider, MD  nabumetone (RELAFEN) 750 MG tablet Take 750 mg by mouth 2 (two) times daily. 01/14/14  Yes Historical Provider, MD  simvastatin (ZOCOR) 10 MG tablet Take 10 mg by mouth at bedtime. 01/31/14  Yes Historical Provider, MD  triazolam (HALCION) 0.25 MG tablet Take 1  tablet by mouth at bedtime. 01/31/14  Yes Historical Provider, MD   BP 118/76  Pulse 77  Temp(Src) 98 F (36.7 C) (Oral)  Resp 16  Ht 5\' 8"  (1.727 m)  Wt 158 lb (71.668 kg)  BMI 24.03 kg/m2  SpO2 98% Physical Exam  Nursing note and vitals reviewed. Constitutional: He is oriented to person, place, and time. He appears well-developed and well-nourished.  Non-toxic appearance. He appears distressed.  Uncomfortable appearing.  HENT:  Head: Normocephalic.  Right Ear: Tympanic membrane and external ear normal.  Left Ear: Tympanic membrane and  external ear normal.  Eyes: EOM and lids are normal. Pupils are equal, round, and reactive to light.  Neck: Normal range of motion. Neck supple. Carotid bruit is not present.  Cardiovascular: Normal rate, regular rhythm, normal heart sounds, intact distal pulses and normal pulses.   Pulmonary/Chest: Breath sounds normal. No respiratory distress.  Abdominal: Soft. Bowel sounds are normal. There is no tenderness. There is no guarding.  Musculoskeletal: Normal range of motion.  Patient has soreness of the paraspinal area of the cervical region on the right. There is pain at the very base of the cervical spine to palpation. Palpation of the middle cervical spine area to the base of the cervical spine causes pain going down the right arm. Patient would not cooperate for full examination of the right shoulder or arm. The radial pulse is 2+. Capillary refill is less than 2 seconds. There no temperature changes of the right upper extremity.  Lymphadenopathy:       Head (right side): No submandibular adenopathy present.       Head (left side): No submandibular adenopathy present.    He has no cervical adenopathy.  Neurological: He is alert and oriented to person, place, and time. He has normal strength. No cranial nerve deficit or sensory deficit.  Is no atrophy noted of the bicep tricep area on limited examination on the right. There is no atrophy of the thenar eminence on limited examination on the right. There is no atrophy of the dorsum of the hand on limited examination on the right. Patient would not cooperate for additional testing.  Skin: Skin is warm and dry.  Psychiatric: He has a normal mood and affect. His speech is normal.    ED Course  Procedures (including critical care time) Labs Review Labs Reviewed - No data to display  Imaging Review No results found.   EKG Interpretation None      MDM  I reviewed the CT scan that was done on August 31. The patient has some cavitary small  lesions of the left lung, but no involvement of the right lung, other than some scar tissue. This is actually improved from previous evaluation.  The patient has severe pain with even minimal movement of his arm and shoulder. He has some pain with palpation about the neck. I reviewed the previous MR of his neck. The patient has extensive degenerative disc disease changes throughout his neck. A MR of the neck will be obtained as this was more than a year ago with the previous study was done. The patient was treated with intravenous Dilaudid and Zofran.  Patient received some improvement of the pain from the daughter. From a 10 out of 10 to a 6/10. A second dose of medication was given to the patient.  The complete blood count shows white blood cells were slightly elevated at 10,700 the complete blood count is otherwise within normal limits. The patient's metabolic  panel is within normal limits. MRI of the cervical spine reveals marked improvement of the large left-sided disc extrusion at the C3-C4 area there is some mild foraminal narrowing bilaterally with spurring. There is a 2 mm retrolisthesis at C5-C6 with marked disc degeneration and spondylosis this is unchanged from the previous study it is of note that this is causing spinal stenosis and moderate foraminal encroachment bilaterally. The right paracentral disc protrusion has progressed from the previous study at the C7-T1 and could be the cause of the patient's right arm pain according to the radiologist.  These findings were given to the patient and to the family. I have also discussed the case with one of the physician assistants at Dr. Nolon Rod office. They will arrange neurosurgery consultation. The patient is ambulatory. He is able to raise his arm without pain after the second dose of medication. He has excellent capillary refill, radial pulses are 2+, grip is good and strong without problem. Feel that it is safe for the patient to be discharged  home he will continue his current medications and follow her closely with his primary physician who is arranging consultation with a specialist.    Final diagnoses:  None    **I have reviewed nursing notes, vital signs, and all appropriate lab and imaging results for this patient.Jordan Ahr, PA-C 02/14/14 1057

## 2014-02-13 NOTE — Discharge Instructions (Signed)
Your MRI reveals advanced degenerative disc disease involving her cervical spine. Your examination and your symptoms are consistent with a cervical radiculopathy. Please see your primary physician, as they are planning a neurosurgery consultation for you. Please continue your current medications. You were treated today with intravenous narcotics, please use caution getting around tonight. Cervical Radiculopathy Cervical radiculopathy means a nerve in the neck is pinched or bruised. This can cause pain or loss of feeling (numbness) that runs from your neck to your arm and fingers. HOME CARE   Put ice on the injured or painful area.  Put ice in a plastic bag.  Place a towel between your skin and the bag.  Leave the ice on for 15-20 minutes, 03-04 times a day, or as told by your doctor.  If ice does not help, you can try using heat. Take a warm shower or bath, or use a hot water bottle as told by your doctor.  You may try a gentle neck and shoulder massage.  Use a flat pillow when you sleep.  Only take medicines as told by your doctor.  Keep all physical therapy visits as told by your doctor.  If you are given a soft collar, wear it as told by your doctor. GET HELP RIGHT AWAY IF:   Your pain gets worse and is not controlled with medicine.  You lose feeling or feel weak in your hand, arm, face, or leg.  You have a fever or stiff neck.  You cannot control when you poop or pee (incontinence).  You have trouble with walking, balance, or speaking. MAKE SURE YOU:   Understand these instructions.  Will watch your condition.  Will get help right away if you are not doing well or get worse. Document Released: 05/20/2011 Document Revised: 08/23/2011 Document Reviewed: 05/20/2011 [facility] Patient Information 2015 [place], [place]. This information is not intended to replace advice given to you by your health care provider. Make sure you discuss any questions you have with your health care  provider.  Degenerative Disk Disease Degenerative disk disease is a condition caused by the changes that occur in the cushions of the backbone (spinal disks) as you grow older. Spinal disks are soft and compressible disks located between the bones of the spine (vertebrae). They act like shock absorbers. Degenerative disk disease can affect the whole spine. However, the neck and lower back are most commonly affected. Many changes can occur in the spinal disks with aging, such as:  The spinal disks may dry and shrink.  Small tears may occur in the tough, outer covering of the disk (annulus).  The disk space may become smaller due to loss of water.  Abnormal growths in the bone (spurs) may occur. This can put pressure on the nerve roots exiting the spinal canal, causing pain.  The spinal canal may become narrowed. CAUSES  Degenerative disk disease is a condition caused by the changes that occur in the spinal disks with aging. The exact cause is not known, but there is a genetic basis for many patients. Degenerative changes can occur due to loss of fluid in the disk. This makes the disk thinner and reduces the space between the backbones. Small cracks can develop in the outer layer of the disk. This can lead to the breakdown of the disk. You are more likely to get degenerative disk disease if you are overweight. Smoking cigarettes and doing heavy work such as weightlifting can also increase your risk of this condition. Degenerative changes can  start after a sudden injury. Growth of bone spurs can compress the nerve roots and cause pain.  SYMPTOMS  The symptoms vary from person to person. Some people may have no pain, while others have severe pain. The pain may be so severe that it can limit your activities. The location of the pain depends on the part of your backbone that is affected. You will have neck or arm pain if a disk in the neck area is affected. You will have pain in your back, buttocks, or  legs if a disk in the lower back is affected. The pain becomes worse while bending, reaching up, or with twisting movements. The pain may start gradually and then get worse as time passes. It may also start after a major or minor injury. You may feel numbness or tingling in the arms or legs.  DIAGNOSIS  Your caregiver will ask you about your symptoms and about activities or habits that may cause the pain. He or she may also ask about any injuries, diseases, or treatments you have had earlier. Your caregiver will examine you to check for the range of movement that is possible in the affected area, to check for strength in your extremities, and to check for sensation in the areas of the arms and legs supplied by different nerve roots. An X-ray of the spine may be taken. Your caregiver may suggest other imaging tests, such as magnetic resonance imaging (MRI), if needed.  TREATMENT  Treatment includes rest, modifying your activities, and applying ice and heat. Your caregiver may prescribe medicines to reduce your pain and may ask you to do some exercises to strengthen your back. In some cases, you may need surgery. You and your caregiver will decide on the treatment that is best for you. HOME CARE INSTRUCTIONS   Follow proper lifting and walking techniques as advised by your caregiver.  Maintain good posture.  Exercise regularly as advised.  Perform relaxation exercises.  Change your sitting, standing, and sleeping habits as advised. Change positions frequently.  Lose weight as advised.  Stop smoking if you smoke.  Wear supportive footwear. SEEK MEDICAL CARE IF:  Your pain does not go away within 1 to 4 weeks. SEEK IMMEDIATE MEDICAL CARE IF:   Your pain is severe.  You notice weakness in your arms, hands, or legs.  You begin to lose control of your bladder or bowel movements. MAKE SURE YOU:   Understand these instructions.  Will watch your condition.  Will get help right away if you  are not doing well or get worse. Document Released: 03/28/2007 Document Revised: 08/23/2011 Document Reviewed: 10/02/2013 [facility] Patient Information 2015 [place], [place]. This information is not intended to replace advice given to you by your health care provider. Make sure you discuss any questions you have with your health care provider.

## 2014-02-13 NOTE — ED Notes (Signed)
Pain under right arm x ~5 weeks. Pt had CT scan Monday. Pt unable to get in touch with PCP for appt. Pt states, "give me a shot and I need send me home" Pt is taking hydrocodone 10/325 at home without relief.

## 2014-02-14 NOTE — ED Provider Notes (Signed)
Medical screening examination/treatment/procedure(s) were performed by non-physician practitioner and as supervising physician I was immediately available for consultation/collaboration.   EKG Interpretation None        Maudry Diego, MD 02/14/14 1534

## 2014-07-03 DIAGNOSIS — Z681 Body mass index (BMI) 19 or less, adult: Secondary | ICD-10-CM | POA: Diagnosis not present

## 2014-07-03 DIAGNOSIS — F419 Anxiety disorder, unspecified: Secondary | ICD-10-CM | POA: Diagnosis not present

## 2014-07-03 DIAGNOSIS — G894 Chronic pain syndrome: Secondary | ICD-10-CM | POA: Diagnosis not present

## 2014-09-11 ENCOUNTER — Other Ambulatory Visit (HOSPITAL_COMMUNITY): Payer: Self-pay | Admitting: Pulmonary Disease

## 2014-09-11 DIAGNOSIS — R911 Solitary pulmonary nodule: Secondary | ICD-10-CM

## 2014-09-16 ENCOUNTER — Ambulatory Visit (HOSPITAL_COMMUNITY)
Admission: RE | Admit: 2014-09-16 | Discharge: 2014-09-16 | Disposition: A | Discharge status: Home / Self Care | Payer: 59 | Source: Ambulatory Visit | Attending: Pulmonary Disease | Admitting: Pulmonary Disease

## 2014-09-16 DIAGNOSIS — R911 Solitary pulmonary nodule: Secondary | ICD-10-CM

## 2014-09-16 DIAGNOSIS — R918 Other nonspecific abnormal finding of lung field: Secondary | ICD-10-CM | POA: Insufficient documentation

## 2016-03-31 DIAGNOSIS — Z0000 Encounter for general adult medical examination without abnormal findings: Secondary | ICD-10-CM | POA: Diagnosis not present

## 2016-03-31 DIAGNOSIS — Z125 Encounter for screening for malignant neoplasm of prostate: Secondary | ICD-10-CM | POA: Diagnosis not present

## 2016-03-31 DIAGNOSIS — E782 Mixed hyperlipidemia: Secondary | ICD-10-CM | POA: Diagnosis not present

## 2016-03-31 DIAGNOSIS — Z1389 Encounter for screening for other disorder: Secondary | ICD-10-CM | POA: Diagnosis not present

## 2016-04-30 ENCOUNTER — Emergency Department (HOSPITAL_COMMUNITY)
Admission: EM | Admit: 2016-04-30 | Discharge: 2016-04-30 | Disposition: A | Discharge status: Home / Self Care | Payer: Medicare Other | Attending: Emergency Medicine | Admitting: Emergency Medicine

## 2016-04-30 ENCOUNTER — Encounter (HOSPITAL_COMMUNITY): Payer: Self-pay | Admitting: Cardiology

## 2016-04-30 ENCOUNTER — Emergency Department (HOSPITAL_COMMUNITY): Payer: Medicare Other

## 2016-04-30 DIAGNOSIS — K439 Ventral hernia without obstruction or gangrene: Secondary | ICD-10-CM | POA: Diagnosis not present

## 2016-04-30 DIAGNOSIS — F17210 Nicotine dependence, cigarettes, uncomplicated: Secondary | ICD-10-CM | POA: Insufficient documentation

## 2016-04-30 DIAGNOSIS — R1084 Generalized abdominal pain: Secondary | ICD-10-CM

## 2016-04-30 DIAGNOSIS — Z79899 Other long term (current) drug therapy: Secondary | ICD-10-CM | POA: Diagnosis not present

## 2016-04-30 DIAGNOSIS — R339 Retention of urine, unspecified: Secondary | ICD-10-CM | POA: Diagnosis present

## 2016-04-30 HISTORY — DX: Unspecified osteoarthritis, unspecified site: M19.90

## 2016-04-30 LAB — CBC WITH DIFFERENTIAL/PLATELET
BASOS ABS: 0.1 10*3/uL (ref 0.0–0.1)
BASOS PCT: 0 %
EOS ABS: 0.1 10*3/uL (ref 0.0–0.7)
EOS PCT: 1 %
HCT: 45.5 % (ref 39.0–52.0)
Hemoglobin: 15.8 g/dL (ref 13.0–17.0)
Lymphocytes Relative: 21 %
Lymphs Abs: 2.6 10*3/uL (ref 0.7–4.0)
MCH: 34.6 pg — ABNORMAL HIGH (ref 26.0–34.0)
MCHC: 34.7 g/dL (ref 30.0–36.0)
MCV: 99.8 fL (ref 78.0–100.0)
MONO ABS: 1.4 10*3/uL — AB (ref 0.1–1.0)
Monocytes Relative: 12 %
Neutro Abs: 8.2 10*3/uL — ABNORMAL HIGH (ref 1.7–7.7)
Neutrophils Relative %: 66 %
PLATELETS: 215 10*3/uL (ref 150–400)
RBC: 4.56 MIL/uL (ref 4.22–5.81)
RDW: 12.4 % (ref 11.5–15.5)
WBC: 12.4 10*3/uL — AB (ref 4.0–10.5)

## 2016-04-30 LAB — URINALYSIS, ROUTINE W REFLEX MICROSCOPIC
Bilirubin Urine: NEGATIVE
GLUCOSE, UA: NEGATIVE mg/dL
Hgb urine dipstick: NEGATIVE
KETONES UR: NEGATIVE mg/dL
LEUKOCYTES UA: NEGATIVE
Nitrite: NEGATIVE
PROTEIN: NEGATIVE mg/dL
Specific Gravity, Urine: <1.005 — ABNORMAL LOW (ref 1.005–1.030)
pH: 6 (ref 5.0–8.0)

## 2016-04-30 LAB — COMPREHENSIVE METABOLIC PANEL
ALT: 9 U/L — AB (ref 17–63)
AST: 15 U/L (ref 15–41)
Albumin: 3.8 g/dL (ref 3.5–5.0)
Alkaline Phosphatase: 61 U/L (ref 38–126)
Anion gap: 7 (ref 5–15)
BILIRUBIN TOTAL: 0.9 mg/dL (ref 0.3–1.2)
BUN: 10 mg/dL (ref 6–20)
CALCIUM: 9 mg/dL (ref 8.9–10.3)
CHLORIDE: 102 mmol/L (ref 101–111)
CO2: 27 mmol/L (ref 22–32)
CREATININE: 1.12 mg/dL (ref 0.61–1.24)
Glucose, Bld: 112 mg/dL — ABNORMAL HIGH (ref 65–99)
Potassium: 3.9 mmol/L (ref 3.5–5.1)
Sodium: 136 mmol/L (ref 135–145)
TOTAL PROTEIN: 7.2 g/dL (ref 6.5–8.1)

## 2016-04-30 LAB — LIPASE, BLOOD: LIPASE: 21 U/L (ref 11–51)

## 2016-04-30 LAB — I-STAT CG4 LACTIC ACID, ED: LACTIC ACID, VENOUS: 0.96 mmol/L (ref 0.5–1.9)

## 2016-04-30 MED ORDER — IOPAMIDOL (ISOVUE-300) INJECTION 61%
100.0000 mL | Freq: Once | INTRAVENOUS | Status: AC | PRN
  Administered 2016-04-30: 100 mL via INTRAVENOUS

## 2016-04-30 MED ORDER — MORPHINE SULFATE (PF) 4 MG/ML IV SOLN
4.0000 mg | Freq: Once | INTRAVENOUS | Status: AC
  Administered 2016-04-30: 4 mg via INTRAVENOUS
  Filled 2016-04-30: qty 1

## 2016-04-30 MED ORDER — TRAMADOL HCL 50 MG PO TABS: 50.0000 mg | ORAL_TABLET | Freq: Four times a day (QID) | ORAL | 0 refills | Status: DC | PRN

## 2016-04-30 MED ORDER — IOPAMIDOL (ISOVUE-300) INJECTION 61%
INTRAVENOUS | Status: AC
  Administered 2016-04-30: 30 mL
  Filled 2016-04-30: qty 30

## 2016-04-30 MED ORDER — ONDANSETRON HCL 4 MG/2ML IJ SOLN
4.0000 mg | Freq: Once | INTRAMUSCULAR | Status: AC
  Administered 2016-04-30: 4 mg via INTRAVENOUS
  Filled 2016-04-30: qty 2

## 2016-04-30 MED ORDER — ONDANSETRON HCL 4 MG PO TABS: 4.0000 mg | ORAL_TABLET | Freq: Three times a day (TID) | ORAL | 0 refills | Status: DC | PRN

## 2016-04-30 MED ORDER — SODIUM CHLORIDE 0.9 % IV BOLUS (SEPSIS)
500.0000 mL | Freq: Once | INTRAVENOUS | Status: AC
  Administered 2016-04-30: 500 mL via INTRAVENOUS

## 2016-04-30 NOTE — ED Notes (Signed)
Pt returned from ct

## 2016-04-30 NOTE — Discharge Instructions (Signed)
Read the information below.  Use the prescribed medication as directed.  Please discuss all new medications with your pharmacist.  You may return to the Emergency Department at any time for worsening condition or any new symptoms that concern you.   If you develop high fevers, worsening abdominal pain, uncontrolled vomiting, or are unable to tolerate fluids by mouth, return to the ER for a recheck.  ° °

## 2016-04-30 NOTE — ED Triage Notes (Signed)
Pt states he has had a hard time urinating for a year.  Last few days unable to urinate a lot.  Has appointment with kidney doctor in January.

## 2016-04-30 NOTE — ED Provider Notes (Signed)
[facility] DEPT Provider Note   CSN: [id number] Arrival date & time: 04/30/16  [id number]     History   Chief Complaint Chief Complaint  Patient presents with  . Urinary Retention    HPI Jordan May is a 60 y.o. male.  HPI   Pt with hx chronic back pain p/w several days of not feeling well, decreased PO intake, abdominal pain, difficulty urinating.   He is able to dribble a small amount of urine but not much, notes this has been progressively worsening throughout the year.  His abdominal pain is located in the lower abdomen, described as burning, comes and goes, no exacerbating or palliative factors.  Had small diarrhea BM 3 days ago, decreased BM x 1 week.  Has had decreased appetite x 4 days.  Associated nausea without vomiting.  Denies fevers, chills, body aches.    Past Medical History:  Diagnosis Date  . Arthritis   . Chronic back pain   . Hypercholesterolemia     There are no active problems to display for this patient.   Past Surgical History:  Procedure Laterality Date  . APPENDECTOMY    . BACK SURGERY    . NECK SURGERY         Home Medications    Prior to Admission medications   Medication Sig Start Date End Date Taking? Authorizing Provider  diazepam (VALIUM) 10 MG tablet Take 10 mg by mouth every 6 (six) hours as needed. Muscle spasm 01/31/14  Yes Historical Provider, MD  Diclofenac Sodium POWD Apply 1 application topically 4 (four) times daily. 02/05/14  Yes Historical Provider, MD  HYDROcodone-acetaminophen (NORCO) 10-325 MG per tablet Take 1 tablet by mouth every 6 (six) hours as needed. pain 01/31/14  Yes Historical Provider, MD  nabumetone (RELAFEN) 750 MG tablet Take 750 mg by mouth 2 (two) times daily. 01/14/14  Yes Historical Provider, MD  simvastatin (ZOCOR) 10 MG tablet Take 10 mg by mouth at bedtime. 01/31/14  Yes Historical Provider, MD  triazolam (HALCION) 0.25 MG tablet Take 1 tablet by mouth at bedtime. 01/31/14  Yes Historical Provider, MD    ondansetron (ZOFRAN) 4 MG tablet Take 1 tablet (4 mg total) by mouth every 8 (eight) hours as needed for nausea or vomiting. 04/30/16   Clayton Bibles, PA-C  traMADol (ULTRAM) 50 MG tablet Take 1 tablet (50 mg total) by mouth every 6 (six) hours as needed for severe pain. 04/30/16   Clayton Bibles, PA-C    Family History History reviewed. No pertinent family history.  Social History Social History  Substance Use Topics  . Smoking status: Current Every Day Smoker    Types: Cigarettes  . Smokeless tobacco: Never Used  . Alcohol use No     Allergies   Oxycodone and Penicillins   Review of Systems Review of Systems  All other systems reviewed and are negative.    Physical Exam Updated Vital Signs BP 116/73   Pulse 62   Temp 98.4 F (36.9 C) (Oral)   Resp 18   Ht 5\' 8"  (1.727 m)   Wt 72.6 kg   SpO2 97%   BMI 24.33 kg/m   Physical Exam  Constitutional: He appears well-developed and well-nourished. No distress.  HENT:  Head: Normocephalic and atraumatic.  Neck: Neck supple.  Cardiovascular: Normal rate and regular rhythm.   Pulmonary/Chest: Effort normal and breath sounds normal. No respiratory distress. He has no wheezes. He has no rales.  Abdominal: Soft. Bowel sounds are normal. He exhibits  no distension and no mass. There is generalized tenderness. There is guarding. There is no rebound.  Neurological: He is alert. He exhibits normal muscle tone.  Skin: He is not diaphoretic.  Nursing note and vitals reviewed.    ED Treatments / Results  Labs (all labs ordered are listed, but only abnormal results are displayed) Labs Reviewed  COMPREHENSIVE METABOLIC PANEL - Abnormal; Notable for the following:       Result Value   Glucose, Bld 112 (*)    ALT 9 (*)    All other components within normal limits  CBC WITH DIFFERENTIAL/PLATELET - Abnormal; Notable for the following:    WBC 12.4 (*)    MCH 34.6 (*)    Neutro Abs 8.2 (*)    Monocytes Absolute 1.4 (*)    All other  components within normal limits  URINALYSIS, ROUTINE W REFLEX MICROSCOPIC (NOT AT [facility]) - Abnormal; Notable for the following:    Specific Gravity, Urine <1.005 (*)    All other components within normal limits  URINE CULTURE  LIPASE, BLOOD  I-STAT CG4 LACTIC ACID, ED    EKG  EKG Interpretation None       Radiology Ct Abdomen Pelvis W Contrast  Result Date: 04/30/2016 CLINICAL DATA:  Oliguria.  Abdominal pain. EXAM: CT ABDOMEN AND PELVIS WITH CONTRAST TECHNIQUE: Multidetector CT imaging of the abdomen and pelvis was performed using the standard protocol following bolus administration of intravenous contrast. CONTRAST:  28mL ISOVUE-300 IOPAMIDOL (ISOVUE-300) INJECTION 61%, 138mL ISOVUE-300 IOPAMIDOL (ISOVUE-300) INJECTION 61% COMPARISON:  PET-CT dated 04/13/2010 FINDINGS: Lower chest: Suspected paraseptal emphysema along with some coarse interstitial accentuation in the lung bases. There is a small left pleural effusion, nonspecific for transudative versus exudative etiology. Hepatobiliary: Unremarkable Pancreas: Unremarkable Spleen: Unremarkable Adrenals/Urinary Tract: Adrenal glands normal. Expected in normal enhancement of both kidneys and excretion of contrast into the ureters. Urinary bladder volume is 82 cc, relatively nondistended. Stomach/Bowel: Unremarkable.  Appendectomy. Vascular/Lymphatic: Mild aortoiliac atherosclerotic calcification. No adenopathy. Reproductive: The prostate gland measures 4.0 by 5.1 by 5.0 cm (volume = 53 cm^3). Other: Equivocal low-grade edema along the mesentery and omentum. Musculoskeletal: Spondylosis and degenerative disc disease causing mild right foraminal impingement at L4-5. Supraumbilical ventral hernia, lobulation of adipose tissue 2.8 cm in diameter, very small hernia neck. IMPRESSION: 1. The kidneys appear to be functioning normally in the urinary bladder is of relatively small caliber. The prostate gland is mildly enlarged at 53 cc parenchymal volume.  2. Suspected paraseptal emphysema along the lung bases along with some mild coarse interstitial accentuation suggesting mild fibrosis. 3. Small left pleural effusion. 4. Equivocal stranding in the omentum and mesentery, significance uncertain. 5. Small supraumbilical ventral hernia containing adipose tissue. 6. Mild aortoiliac atherosclerosis. Electronically Signed   By: Van Clines M.D.   On: 04/30/2016 10:30    Procedures Procedures (including critical care time)  Medications Ordered in ED Medications  morphine 4 MG/ML injection 4 mg (4 mg Intravenous Given 04/30/16 0906)  ondansetron (ZOFRAN) injection 4 mg (4 mg Intravenous Given 04/30/16 0906)  sodium chloride 0.9 % bolus 500 mL (0 mLs Intravenous Stopped 04/30/16 1016)  iopamidol (ISOVUE-300) 61 % injection (30 mLs  Contrast Given 04/30/16 0914)  iopamidol (ISOVUE-300) 61 % injection 100 mL (100 mLs Intravenous Contrast Given 04/30/16 0959)     Initial Impression / Assessment and Plan / ED Course  I have reviewed the triage vital signs and the nursing notes.  Pertinent labs & imaging results that were available during my care  of the patient were reviewed by me and considered in my medical decision making (see chart for details).  Clinical Course as of Apr 30 1600  Fri Apr 30, 2016  1208 Patient reports he is feeling better and would like to be discharged home. Has urinated a significant amount in urinal.  Pt also seen and examined by Dr Laverta Baltimore, discussed workup and plan with him.  Plan for patient to have 48 hour recheck with PCP or in ED given equivocal stranding on CT, with return precautions for worsening symptoms.  Home with pain and nausea medications.    [EW]    Clinical Course User Index [EW] Clayton Bibles, PA-C    Afebrile patient with progressive decreased urination x 1 year and approximately 4 days of decreased appetite, abdominal pain, difficulty urinating, decreased bowel movement, generally not feeling well.  Bladder  scanner revealed empty bladder.  Labs significant for leukocytosis.  CT abd/pelvis demonstrates equivocal stranding of the omentum and mesentery, also incidentally with slightly enlarged prostate, emphysematous changes of lungs and small pleural effusion.  Unclear source of stranding.  Lactic acid is normal. UA negative for infection.   Pt does not c/o CP or SOB.  Patient also seen and examined by Dr Laverta Baltimore - please see discussion above.  D/C home with recheck with PCP or ED within 48 hours, return precautions.  Discussed result, findings, treatment, and follow up  with patient.  Pt given return precautions.  Pt verbalizes understanding and agrees with plan.      Final Clinical Impressions(s) / ED Diagnoses   Final diagnoses:  Generalized abdominal pain    New Prescriptions Discharge Medication List as of 04/30/2016 12:12 PM    START taking these medications   Details  ondansetron (ZOFRAN) 4 MG tablet Take 1 tablet (4 mg total) by mouth every 8 (eight) hours as needed for nausea or vomiting., Starting Fri 04/30/2016, Print    traMADol (ULTRAM) 50 MG tablet Take 1 tablet (50 mg total) by mouth every 6 (six) hours as needed for severe pain., Starting Fri 04/30/2016, Print         [place], PA-C 04/30/16 [place], MD 04/30/16 [id number]

## 2016-04-30 NOTE — ED Notes (Signed)
Pt drinking contrast. 

## 2016-04-30 NOTE — ED Notes (Signed)
Bladder scanner x 3 with 36ml by J CRuise RN

## 2016-05-02 LAB — URINE CULTURE: CULTURE: NO GROWTH

## 2016-06-25 DIAGNOSIS — J449 Chronic obstructive pulmonary disease, unspecified: Secondary | ICD-10-CM | POA: Diagnosis not present

## 2016-06-25 DIAGNOSIS — E782 Mixed hyperlipidemia: Secondary | ICD-10-CM | POA: Diagnosis not present

## 2016-06-25 DIAGNOSIS — M1991 Primary osteoarthritis, unspecified site: Secondary | ICD-10-CM | POA: Diagnosis not present

## 2016-06-25 DIAGNOSIS — Z1389 Encounter for screening for other disorder: Secondary | ICD-10-CM | POA: Diagnosis not present

## 2016-06-25 DIAGNOSIS — F419 Anxiety disorder, unspecified: Secondary | ICD-10-CM | POA: Diagnosis not present

## 2016-06-25 DIAGNOSIS — G4709 Other insomnia: Secondary | ICD-10-CM | POA: Diagnosis not present

## 2016-06-25 DIAGNOSIS — Z0001 Encounter for general adult medical examination with abnormal findings: Secondary | ICD-10-CM | POA: Diagnosis not present

## 2016-06-25 DIAGNOSIS — Z6821 Body mass index (BMI) 21.0-21.9, adult: Secondary | ICD-10-CM | POA: Diagnosis not present

## 2016-10-29 DIAGNOSIS — G894 Chronic pain syndrome: Secondary | ICD-10-CM | POA: Diagnosis not present

## 2016-10-29 DIAGNOSIS — M5416 Radiculopathy, lumbar region: Secondary | ICD-10-CM | POA: Diagnosis not present

## 2016-10-29 DIAGNOSIS — Z1389 Encounter for screening for other disorder: Secondary | ICD-10-CM | POA: Diagnosis not present

## 2016-10-29 DIAGNOSIS — M1991 Primary osteoarthritis, unspecified site: Secondary | ICD-10-CM | POA: Diagnosis not present

## 2016-10-29 DIAGNOSIS — Z6820 Body mass index (BMI) 20.0-20.9, adult: Secondary | ICD-10-CM | POA: Diagnosis not present

## 2016-11-08 DIAGNOSIS — Z1211 Encounter for screening for malignant neoplasm of colon: Secondary | ICD-10-CM | POA: Diagnosis not present

## 2017-04-29 DIAGNOSIS — F419 Anxiety disorder, unspecified: Secondary | ICD-10-CM | POA: Diagnosis not present

## 2017-04-29 DIAGNOSIS — Z6822 Body mass index (BMI) 22.0-22.9, adult: Secondary | ICD-10-CM | POA: Diagnosis not present

## 2017-04-29 DIAGNOSIS — M2550 Pain in unspecified joint: Secondary | ICD-10-CM | POA: Diagnosis not present

## 2017-04-29 DIAGNOSIS — G894 Chronic pain syndrome: Secondary | ICD-10-CM | POA: Diagnosis not present

## 2017-04-29 DIAGNOSIS — M1991 Primary osteoarthritis, unspecified site: Secondary | ICD-10-CM | POA: Diagnosis not present

## 2017-04-29 DIAGNOSIS — Z1389 Encounter for screening for other disorder: Secondary | ICD-10-CM | POA: Diagnosis not present

## 2017-07-29 DIAGNOSIS — Z1389 Encounter for screening for other disorder: Secondary | ICD-10-CM | POA: Diagnosis not present

## 2017-07-29 DIAGNOSIS — G894 Chronic pain syndrome: Secondary | ICD-10-CM | POA: Diagnosis not present

## 2017-07-29 DIAGNOSIS — Z6822 Body mass index (BMI) 22.0-22.9, adult: Secondary | ICD-10-CM | POA: Diagnosis not present

## 2017-07-29 DIAGNOSIS — F419 Anxiety disorder, unspecified: Secondary | ICD-10-CM | POA: Diagnosis not present

## 2017-07-29 DIAGNOSIS — M1991 Primary osteoarthritis, unspecified site: Secondary | ICD-10-CM | POA: Diagnosis not present

## 2017-07-29 DIAGNOSIS — J449 Chronic obstructive pulmonary disease, unspecified: Secondary | ICD-10-CM | POA: Diagnosis not present

## 2017-10-21 DIAGNOSIS — F419 Anxiety disorder, unspecified: Secondary | ICD-10-CM | POA: Diagnosis not present

## 2017-10-21 DIAGNOSIS — G894 Chronic pain syndrome: Secondary | ICD-10-CM | POA: Diagnosis not present

## 2017-10-21 DIAGNOSIS — Z6822 Body mass index (BMI) 22.0-22.9, adult: Secondary | ICD-10-CM | POA: Diagnosis not present

## 2017-10-21 DIAGNOSIS — M47816 Spondylosis without myelopathy or radiculopathy, lumbar region: Secondary | ICD-10-CM | POA: Diagnosis not present

## 2017-10-21 DIAGNOSIS — Z0001 Encounter for general adult medical examination with abnormal findings: Secondary | ICD-10-CM | POA: Diagnosis not present

## 2018-02-19 ENCOUNTER — Encounter (HOSPITAL_COMMUNITY): Payer: Self-pay

## 2018-02-19 ENCOUNTER — Inpatient Hospital Stay (HOSPITAL_COMMUNITY)
Admission: EM | Admit: 2018-02-19 | Discharge: 2018-02-24 | DRG: 871 | Disposition: A | Discharge status: Home / Self Care | Payer: Medicare Other | Attending: Internal Medicine | Admitting: Internal Medicine

## 2018-02-19 ENCOUNTER — Emergency Department (HOSPITAL_COMMUNITY): Payer: Medicare Other

## 2018-02-19 ENCOUNTER — Other Ambulatory Visit: Payer: Self-pay

## 2018-02-19 DIAGNOSIS — G8929 Other chronic pain: Secondary | ICD-10-CM | POA: Diagnosis present

## 2018-02-19 DIAGNOSIS — T380X5A Adverse effect of glucocorticoids and synthetic analogues, initial encounter: Secondary | ICD-10-CM | POA: Diagnosis not present

## 2018-02-19 DIAGNOSIS — R7302 Impaired glucose tolerance (oral): Secondary | ICD-10-CM | POA: Diagnosis present

## 2018-02-19 DIAGNOSIS — F17210 Nicotine dependence, cigarettes, uncomplicated: Secondary | ICD-10-CM | POA: Diagnosis present

## 2018-02-19 DIAGNOSIS — R7989 Other specified abnormal findings of blood chemistry: Secondary | ICD-10-CM | POA: Diagnosis not present

## 2018-02-19 DIAGNOSIS — Z720 Tobacco use: Secondary | ICD-10-CM | POA: Diagnosis not present

## 2018-02-19 DIAGNOSIS — R0781 Pleurodynia: Secondary | ICD-10-CM | POA: Diagnosis present

## 2018-02-19 DIAGNOSIS — I700 Atherosclerosis of aorta: Secondary | ICD-10-CM | POA: Diagnosis present

## 2018-02-19 DIAGNOSIS — E785 Hyperlipidemia, unspecified: Secondary | ICD-10-CM | POA: Diagnosis present

## 2018-02-19 DIAGNOSIS — M549 Dorsalgia, unspecified: Secondary | ICD-10-CM | POA: Diagnosis present

## 2018-02-19 DIAGNOSIS — I5031 Acute diastolic (congestive) heart failure: Secondary | ICD-10-CM | POA: Diagnosis present

## 2018-02-19 DIAGNOSIS — I2510 Atherosclerotic heart disease of native coronary artery without angina pectoris: Secondary | ICD-10-CM | POA: Diagnosis present

## 2018-02-19 DIAGNOSIS — R9431 Abnormal electrocardiogram [ECG] [EKG]: Secondary | ICD-10-CM | POA: Diagnosis present

## 2018-02-19 DIAGNOSIS — N179 Acute kidney failure, unspecified: Secondary | ICD-10-CM | POA: Diagnosis present

## 2018-02-19 DIAGNOSIS — R17 Unspecified jaundice: Secondary | ICD-10-CM | POA: Diagnosis not present

## 2018-02-19 DIAGNOSIS — J440 Chronic obstructive pulmonary disease with acute lower respiratory infection: Secondary | ICD-10-CM | POA: Diagnosis present

## 2018-02-19 DIAGNOSIS — J449 Chronic obstructive pulmonary disease, unspecified: Secondary | ICD-10-CM | POA: Diagnosis present

## 2018-02-19 DIAGNOSIS — J8410 Pulmonary fibrosis, unspecified: Secondary | ICD-10-CM

## 2018-02-19 DIAGNOSIS — Z6824 Body mass index (BMI) 24.0-24.9, adult: Secondary | ICD-10-CM | POA: Diagnosis not present

## 2018-02-19 DIAGNOSIS — D72829 Elevated white blood cell count, unspecified: Secondary | ICD-10-CM

## 2018-02-19 DIAGNOSIS — E860 Dehydration: Secondary | ICD-10-CM | POA: Diagnosis present

## 2018-02-19 DIAGNOSIS — J9621 Acute and chronic respiratory failure with hypoxia: Secondary | ICD-10-CM | POA: Diagnosis not present

## 2018-02-19 DIAGNOSIS — E7800 Pure hypercholesterolemia, unspecified: Secondary | ICD-10-CM | POA: Diagnosis present

## 2018-02-19 DIAGNOSIS — E440 Moderate protein-calorie malnutrition: Secondary | ICD-10-CM | POA: Diagnosis present

## 2018-02-19 DIAGNOSIS — J181 Lobar pneumonia, unspecified organism: Secondary | ICD-10-CM | POA: Diagnosis present

## 2018-02-19 DIAGNOSIS — I959 Hypotension, unspecified: Secondary | ICD-10-CM

## 2018-02-19 DIAGNOSIS — M1990 Unspecified osteoarthritis, unspecified site: Secondary | ICD-10-CM | POA: Diagnosis present

## 2018-02-19 DIAGNOSIS — R109 Unspecified abdominal pain: Secondary | ICD-10-CM | POA: Diagnosis not present

## 2018-02-19 DIAGNOSIS — J9601 Acute respiratory failure with hypoxia: Secondary | ICD-10-CM | POA: Diagnosis present

## 2018-02-19 DIAGNOSIS — J441 Chronic obstructive pulmonary disease with (acute) exacerbation: Secondary | ICD-10-CM | POA: Diagnosis present

## 2018-02-19 DIAGNOSIS — Z79899 Other long term (current) drug therapy: Secondary | ICD-10-CM

## 2018-02-19 DIAGNOSIS — R739 Hyperglycemia, unspecified: Secondary | ICD-10-CM | POA: Diagnosis not present

## 2018-02-19 DIAGNOSIS — Z885 Allergy status to narcotic agent status: Secondary | ICD-10-CM

## 2018-02-19 DIAGNOSIS — R0902 Hypoxemia: Secondary | ICD-10-CM | POA: Diagnosis not present

## 2018-02-19 DIAGNOSIS — Z880 Allergy status to penicillin: Secondary | ICD-10-CM

## 2018-02-19 DIAGNOSIS — K449 Diaphragmatic hernia without obstruction or gangrene: Secondary | ICD-10-CM | POA: Diagnosis not present

## 2018-02-19 DIAGNOSIS — A419 Sepsis, unspecified organism: Secondary | ICD-10-CM | POA: Diagnosis not present

## 2018-02-19 HISTORY — DX: Tobacco use: Z72.0

## 2018-02-19 HISTORY — DX: Emphysema, unspecified: J43.9

## 2018-02-19 LAB — TROPONIN I
Troponin I: <0.03 ng/mL (ref <0.03)
Troponin I: <0.03 ng/mL (ref <0.03)

## 2018-02-19 LAB — DIFFERENTIAL
BASOS ABS: 0 10*3/uL (ref 0.0–0.1)
BASOS PCT: 0 %
EOS ABS: 0 10*3/uL (ref 0.0–0.7)
Eosinophils Relative: 0 %
LYMPHS ABS: 1.5 10*3/uL (ref 0.7–4.0)
Lymphocytes Relative: 7 %
Monocytes Absolute: 2.1 10*3/uL — ABNORMAL HIGH (ref 0.1–1.0)
Monocytes Relative: 9 %
NEUTROS PCT: 84 %
Neutro Abs: 19.9 10*3/uL — ABNORMAL HIGH (ref 1.7–7.7)

## 2018-02-19 LAB — URINALYSIS, ROUTINE W REFLEX MICROSCOPIC
BILIRUBIN URINE: NEGATIVE
GLUCOSE, UA: NEGATIVE mg/dL
Hgb urine dipstick: NEGATIVE
Ketones, ur: NEGATIVE mg/dL
Leukocytes, UA: NEGATIVE
NITRITE: NEGATIVE
PH: 6 (ref 5.0–8.0)
Protein, ur: NEGATIVE mg/dL
SPECIFIC GRAVITY, URINE: 1.015 (ref 1.005–1.030)

## 2018-02-19 LAB — CBC
HCT: 42.2 % (ref 39.0–52.0)
Hemoglobin: 14.6 g/dL (ref 13.0–17.0)
MCH: 34.4 pg — ABNORMAL HIGH (ref 26.0–34.0)
MCHC: 34.6 g/dL (ref 30.0–36.0)
MCV: 99.5 fL (ref 78.0–100.0)
PLATELETS: 153 10*3/uL (ref 150–400)
RBC: 4.24 MIL/uL (ref 4.22–5.81)
RDW: 14 % (ref 11.5–15.5)
WBC: 23.8 10*3/uL — ABNORMAL HIGH (ref 4.0–10.5)

## 2018-02-19 LAB — HEPATIC FUNCTION PANEL
ALK PHOS: 60 U/L (ref 38–126)
ALT: 11 U/L (ref 0–44)
AST: 20 U/L (ref 15–41)
Albumin: 3.2 g/dL — ABNORMAL LOW (ref 3.5–5.0)
BILIRUBIN INDIRECT: 1.2 mg/dL — AB (ref 0.3–0.9)
Bilirubin, Direct: 0.2 mg/dL (ref 0.0–0.2)
TOTAL PROTEIN: 7 g/dL (ref 6.5–8.1)
Total Bilirubin: 1.4 mg/dL — ABNORMAL HIGH (ref 0.3–1.2)

## 2018-02-19 LAB — PHOSPHORUS: Phosphorus: 2 mg/dL — ABNORMAL LOW (ref 2.5–4.6)

## 2018-02-19 LAB — GLUCOSE, CAPILLARY: GLUCOSE-CAPILLARY: 162 mg/dL — AB (ref 70–99)

## 2018-02-19 LAB — BASIC METABOLIC PANEL
Anion gap: 9 (ref 5–15)
BUN: 19 mg/dL (ref 8–23)
CALCIUM: 8.8 mg/dL — AB (ref 8.9–10.3)
CO2: 25 mmol/L (ref 22–32)
CREATININE: 1.51 mg/dL — AB (ref 0.61–1.24)
Chloride: 102 mmol/L (ref 98–111)
GFR calc Af Amer: 55 mL/min — ABNORMAL LOW (ref >60)
GFR, EST NON AFRICAN AMERICAN: 47 mL/min — AB (ref >60)
GLUCOSE: 169 mg/dL — AB (ref 70–99)
Potassium: 4.1 mmol/L (ref 3.5–5.1)
Sodium: 136 mmol/L (ref 135–145)

## 2018-02-19 LAB — MAGNESIUM: Magnesium: 1.5 mg/dL — ABNORMAL LOW (ref 1.7–2.4)

## 2018-02-19 LAB — LIPASE, BLOOD: LIPASE: 23 U/L (ref 11–51)

## 2018-02-19 LAB — LACTIC ACID, PLASMA
LACTIC ACID, VENOUS: 2.6 mmol/L — AB (ref 0.5–1.9)
Lactic Acid, Venous: 2.5 mmol/L (ref 0.5–1.9)

## 2018-02-19 MED ORDER — KETOROLAC TROMETHAMINE 30 MG/ML IJ SOLN
30.0000 mg | Freq: Once | INTRAMUSCULAR | Status: AC
  Administered 2018-02-19: 30 mg via INTRAVENOUS
  Filled 2018-02-19: qty 1

## 2018-02-19 MED ORDER — AZITHROMYCIN 250 MG PO TABS
500.0000 mg | ORAL_TABLET | Freq: Once | ORAL | Status: AC
  Administered 2018-02-19: 500 mg via ORAL
  Filled 2018-02-19: qty 2

## 2018-02-19 MED ORDER — GABAPENTIN 100 MG PO CAPS
100.0000 mg | ORAL_CAPSULE | Freq: Three times a day (TID) | ORAL | Status: DC
  Administered 2018-02-19 – 2018-02-24 (×15): 100 mg via ORAL
  Filled 2018-02-19 (×15): qty 1

## 2018-02-19 MED ORDER — ONDANSETRON HCL 4 MG/2ML IJ SOLN: 4.0000 mg | Freq: Four times a day (QID) | INTRAMUSCULAR | Status: DC | PRN

## 2018-02-19 MED ORDER — SODIUM CHLORIDE 0.9 % IV BOLUS
1000.0000 mL | Freq: Once | INTRAVENOUS | Status: AC
  Administered 2018-02-19: 1000 mL via INTRAVENOUS

## 2018-02-19 MED ORDER — IPRATROPIUM-ALBUTEROL 0.5-2.5 (3) MG/3ML IN SOLN
3.0000 mL | Freq: Once | RESPIRATORY_TRACT | Status: AC
  Administered 2018-02-19: 3 mL via RESPIRATORY_TRACT
  Filled 2018-02-19: qty 3

## 2018-02-19 MED ORDER — SODIUM CHLORIDE 0.9 % IV SOLN
1.0000 g | Freq: Once | INTRAVENOUS | Status: AC
  Administered 2018-02-19: 1 g via INTRAVENOUS
  Filled 2018-02-19: qty 10

## 2018-02-19 MED ORDER — SODIUM CHLORIDE 0.9 % IV SOLN
500.0000 mg | INTRAVENOUS | Status: DC
  Administered 2018-02-20 – 2018-02-21 (×2): 500 mg via INTRAVENOUS
  Filled 2018-02-19 (×3): qty 500

## 2018-02-19 MED ORDER — IPRATROPIUM-ALBUTEROL 0.5-2.5 (3) MG/3ML IN SOLN
3.0000 mL | Freq: Four times a day (QID) | RESPIRATORY_TRACT | Status: DC
  Administered 2018-02-19 – 2018-02-20 (×4): 3 mL via RESPIRATORY_TRACT
  Filled 2018-02-19 (×4): qty 3

## 2018-02-19 MED ORDER — SODIUM CHLORIDE 0.9 % IV SOLN
Freq: Once | INTRAVENOUS | Status: AC
  Administered 2018-02-19: 19:00:00 via INTRAVENOUS

## 2018-02-19 MED ORDER — MORPHINE SULFATE (PF) 4 MG/ML IV SOLN: 4.0000 mg | Freq: Once | INTRAVENOUS | Status: DC

## 2018-02-19 MED ORDER — ONDANSETRON HCL 4 MG/2ML IJ SOLN
4.0000 mg | Freq: Once | INTRAMUSCULAR | Status: AC
  Administered 2018-02-19: 4 mg via INTRAVENOUS
  Filled 2018-02-19: qty 2

## 2018-02-19 MED ORDER — POTASSIUM PHOSPHATE MONOBASIC 500 MG PO TABS
500.0000 mg | ORAL_TABLET | Freq: Three times a day (TID) | ORAL | Status: DC
  Administered 2018-02-20: 500 mg via ORAL
  Filled 2018-02-19 (×3): qty 1

## 2018-02-19 MED ORDER — SODIUM CHLORIDE 0.9 % IV SOLN
INTRAVENOUS | Status: DC
  Administered 2018-02-19 – 2018-02-20 (×2): via INTRAVENOUS

## 2018-02-19 MED ORDER — GUAIFENESIN-DM 100-10 MG/5ML PO SYRP
5.0000 mL | ORAL_SOLUTION | ORAL | Status: DC | PRN
  Administered 2018-02-19: 5 mL via ORAL
  Filled 2018-02-19: qty 5

## 2018-02-19 MED ORDER — ALBUTEROL SULFATE HFA 108 (90 BASE) MCG/ACT IN AERS
2.0000 | INHALATION_SPRAY | RESPIRATORY_TRACT | Status: DC
  Filled 2018-02-19: qty 6.7

## 2018-02-19 MED ORDER — MAGNESIUM SULFATE 2 GM/50ML IV SOLN
2.0000 g | Freq: Once | INTRAVENOUS | Status: AC
  Administered 2018-02-19: 2 g via INTRAVENOUS
  Filled 2018-02-19: qty 50

## 2018-02-19 MED ORDER — ONDANSETRON HCL 4 MG PO TABS
4.0000 mg | ORAL_TABLET | Freq: Four times a day (QID) | ORAL | Status: DC | PRN
  Administered 2018-02-24: 4 mg via ORAL
  Filled 2018-02-19: qty 1

## 2018-02-19 MED ORDER — HYDROCODONE-ACETAMINOPHEN 10-325 MG PO TABS
1.0000 | ORAL_TABLET | Freq: Four times a day (QID) | ORAL | Status: DC | PRN
  Administered 2018-02-19 – 2018-02-24 (×14): 1 via ORAL
  Filled 2018-02-19 (×14): qty 1

## 2018-02-19 MED ORDER — ENOXAPARIN SODIUM 40 MG/0.4ML ~~LOC~~ SOLN
40.0000 mg | SUBCUTANEOUS | Status: DC
  Administered 2018-02-19 – 2018-02-23 (×5): 40 mg via SUBCUTANEOUS
  Filled 2018-02-19 (×5): qty 0.4

## 2018-02-19 MED ORDER — ALBUTEROL SULFATE (2.5 MG/3ML) 0.083% IN NEBU
2.5000 mg | INHALATION_SOLUTION | RESPIRATORY_TRACT | Status: DC | PRN
  Administered 2018-02-22: 2.5 mg via RESPIRATORY_TRACT
  Filled 2018-02-19: qty 3

## 2018-02-19 MED ORDER — FENTANYL CITRATE (PF) 100 MCG/2ML IJ SOLN
50.0000 ug | Freq: Once | INTRAMUSCULAR | Status: AC
  Administered 2018-02-19: 50 ug via INTRAVENOUS
  Filled 2018-02-19: qty 2

## 2018-02-19 MED ORDER — NICOTINE 21 MG/24HR TD PT24
21.0000 mg | MEDICATED_PATCH | Freq: Every day | TRANSDERMAL | Status: DC | PRN
  Administered 2018-02-19 – 2018-02-24 (×5): 21 mg via TRANSDERMAL
  Filled 2018-02-19 (×6): qty 1

## 2018-02-19 MED ORDER — METHYLPREDNISOLONE SODIUM SUCC 40 MG IJ SOLR
40.0000 mg | Freq: Four times a day (QID) | INTRAMUSCULAR | Status: DC
  Administered 2018-02-19 – 2018-02-20 (×2): 40 mg via INTRAVENOUS
  Filled 2018-02-19 (×2): qty 1

## 2018-02-19 MED ORDER — IPRATROPIUM-ALBUTEROL 0.5-2.5 (3) MG/3ML IN SOLN: 3.0000 mL | Freq: Four times a day (QID) | RESPIRATORY_TRACT | Status: DC

## 2018-02-19 MED ORDER — METHYLPREDNISOLONE SODIUM SUCC 125 MG IJ SOLR
125.0000 mg | Freq: Once | INTRAMUSCULAR | Status: AC
  Administered 2018-02-19: 125 mg via INTRAVENOUS
  Filled 2018-02-19: qty 2

## 2018-02-19 MED ORDER — DIAZEPAM 5 MG PO TABS
10.0000 mg | ORAL_TABLET | Freq: Four times a day (QID) | ORAL | Status: DC | PRN
  Administered 2018-02-19 – 2018-02-24 (×7): 10 mg via ORAL
  Filled 2018-02-19 (×7): qty 2

## 2018-02-19 MED ORDER — INSULIN ASPART 100 UNIT/ML ~~LOC~~ SOLN
0.0000 [IU] | Freq: Three times a day (TID) | SUBCUTANEOUS | Status: DC
  Administered 2018-02-20: 2 [IU] via SUBCUTANEOUS
  Administered 2018-02-20: 1 [IU] via SUBCUTANEOUS
  Administered 2018-02-20 – 2018-02-21 (×2): 2 [IU] via SUBCUTANEOUS
  Administered 2018-02-21: 3 [IU] via SUBCUTANEOUS
  Administered 2018-02-21 – 2018-02-23 (×3): 1 [IU] via SUBCUTANEOUS
  Administered 2018-02-23: 2 [IU] via SUBCUTANEOUS

## 2018-02-19 MED ORDER — SODIUM CHLORIDE 0.9 % IV SOLN
1.0000 g | INTRAVENOUS | Status: DC
  Administered 2018-02-20 – 2018-02-23 (×4): 1 g via INTRAVENOUS
  Filled 2018-02-19 (×2): qty 10
  Filled 2018-02-19 (×4): qty 1
  Filled 2018-02-19: qty 10

## 2018-02-19 MED ORDER — ORAL CARE MOUTH RINSE
15.0000 mL | Freq: Two times a day (BID) | OROMUCOSAL | Status: DC
  Administered 2018-02-19 – 2018-02-24 (×8): 15 mL via OROMUCOSAL

## 2018-02-19 MED ORDER — IOHEXOL 300 MG/ML  SOLN
75.0000 mL | Freq: Once | INTRAMUSCULAR | Status: AC | PRN
  Administered 2018-02-19: 75 mL via INTRAVENOUS

## 2018-02-19 NOTE — ED Notes (Signed)
Respiratory called for neb tx 

## 2018-02-19 NOTE — ED Notes (Signed)
Date and time results received: 02/19/18 2002 (use smartphrase ".now" to insert current time)  Test: lactic Critical Value: 2.5  Name of Provider Notified: Dr. Olevia Bowens messaged via [place] at 2003  Orders Received? Or Actions Taken?: no/na

## 2018-02-19 NOTE — H&P (Signed)
History and Physical    Jordan May [id number] DOB: 1955/07/12 DOA: 02/19/2018  PCP: Redmond School, MD   Patient coming from: Home.  I have personally briefly reviewed patient's old medical records in [place]  Chief Complaint: Chest pain  HPI: Jordan May is a 62 y.o. male with medical history significant of arthritis, chronic back pain, emphysema/COPD, hyperlipidemia, tobacco use who is coming to the emergency department with complaints of progressively worse fatigue, chills, malaise, dyspnea, dizziness, pleuritic chest pain due to occasionally productive cough of greenish sputum, decreased appetite and frontal headache since yesterday.  He denies travel history or known sick contacts.  Although, his wife thinks that he may have been exposed to someone with a call.  No night sweats, rhinorrhea, sore throat, hemoptysis, diaphoresis, PND, orthopnea or pitting edema lower extremities.  No abdominal pain, nausea, vomiting, diarrhea, constipation, melena or hematochezia.  Denies dysuria, frequency, hematuria or oliguria.  No polyuria, polydipsia, polyphagia or blurred vision.  No pruritus or skin rashes.  He has not have any cravings to smoke despite smoking cigarettes 1-1/2 to 2 PPD.  ED Course: Initial vital signs temperature 98.5 F, pulse 101, respirations 17, blood pressure 118/70 mmHg and O2 sat 99% on room air.  The patient was lightheaded in the emergency department and had several SBP readings in the 80s mmHg.  He has received 2 L of NS bolus.  I order a third thousand mL's of NS bolus, after he is lactic acid came back at 2.5 mmol/L.  He also received supplemental oxygen, DuoNeb, Solu-Medrol 125 mg IVP, ceftriaxone 1 g and azithromycin 500 mg IVPB.    Urinalysis was normal.  White count was 23.8 with 84% neutrophils, 7% lymphocytes and 9% monocytes.  Hemoglobin 14.6 g/dL and platelets 153.  EKG was sinus rhythm with minimal ST elevation in inferior leads.  Troponin was normal.   Sodium 136, potassium 4.1, chloride 102 and CO2 25 mmol/L.  BUN was 19, creatinine 1.51 (baseline is normal ranging from 0.98-1.12), glucose 169, magnesium 1.5, calcium 8.8 and phosphorus 2.0 mg/dL.  LFTs show an albumin of 3.2 g/dL and total bilirubin of 1.4 mg/dL.  All other values are within normal limits.  Lipase was 23 units/L.  Imaging: Chest radiograph showed severe chronic lung disease with upper lobe predominant emphysema and areas of pulmonary fibrosis.  There is a chronic left pleural effusion.  CT chest with contrast show interval progression of bibasilar predominant pulmonary interstitial fibrosis.  There is more stable left pleural thickening and bilateral pleural parenchymal scarring.  Moderate emphysema moderate aortic atherosclerosis.  There is positive coronary artery calcification.  Please see images and full radiology report for further detail.   Review of Systems: As per HPI otherwise 10 point review of systems negative.   Past Medical History:  Diagnosis Date  . Arthritis   . Chronic back pain   . Emphysema/COPD ([place])   . Hypercholesterolemia   . Tobacco use     Past Surgical History:  Procedure Laterality Date  . APPENDECTOMY    . BACK SURGERY    . NECK SURGERY       reports that he has been smoking cigarettes. He has been smoking about 2.00 packs per day. He has never used smokeless tobacco. He reports that he does not drink alcohol or use drugs.  Allergies  Allergen Reactions  . Oxycodone     hallucinations  . Penicillins Other (See Comments)    Unknown reaction  Family History  Problem Relation Age of Onset  . Heart disease Mother        Pacemaker placement  . Throat cancer Father   . COPD Brother     Prior to Admission medications   Medication Sig Start Date End Date Taking? Authorizing Provider  diazepam (VALIUM) 10 MG tablet Take 10 mg by mouth every 6 (six) hours as needed. Muscle spasm 01/31/14  Yes [provider]  gabapentin  (NEURONTIN) 100 MG capsule Take 1 capsule by mouth 3 (three) times daily. 12/27/17  Yes [provider]  HYDROcodone-acetaminophen (NORCO) 10-325 MG per tablet Take 1 tablet by mouth every 6 (six) hours as needed. pain 01/31/14  Yes [provider]  nabumetone (RELAFEN) 750 MG tablet Take 750 mg by mouth 2 (two) times daily. 01/14/14  Yes [provider]  simvastatin (ZOCOR) 10 MG tablet Take 10 mg by mouth at bedtime. 01/31/14  Yes [provider]  triazolam (HALCION) 0.25 MG tablet Take 1 tablet by mouth at bedtime. 01/31/14  Yes [provider]  ondansetron (ZOFRAN) 4 MG tablet Take 1 tablet (4 mg total) by mouth every 8 (eight) hours as needed for nausea or vomiting. 04/30/16   Clayton Bibles, PA-C    Physical Exam: Vitals:   02/19/18 1900 02/19/18 1915 02/19/18 2000 02/19/18 2030  BP: 95/63  99/63 96/61  Pulse: 78 89 83 78  Resp: (!) 25 18 (!) 26 17  Temp:      TempSrc:      SpO2: 93% 93% 95% 91%  Weight:      Height:       Constitutional: NAD, calm, comfortable Eyes: PERRL, lids and conjunctivae normal ENMT: Mucous membranes are moist. Posterior pharynx clear of any exudate or lesions. Neck: normal, supple, no masses, no thyromegaly Respiratory: clear to auscultation bilaterally, no wheezing, no crackles. Normal respiratory effort. No accessory muscle use.  Cardiovascular: Regular rate and rhythm, no murmurs / rubs / gallops. No extremity edema. 2+ pedal pulses. No carotid bruits.  Abdomen: no tenderness, no masses palpated. No hepatosplenomegaly. Bowel sounds positive.  Musculoskeletal: no clubbing / cyanosis. No joint deformity upper and lower extremities. Good ROM, no contractures. Normal muscle tone.  Skin: no rashes, lesions, ulcers. No induration Neurologic: CN 2-12 grossly intact. Sensation intact, DTR normal. Strength 5/5 in all 4.  Psychiatric: Normal judgment and insight. Alert and oriented x 3. Normal mood.   Labs on Admission: I  have personally reviewed following labs and imaging studies  CBC: Recent Labs  Lab 02/19/18 1327  WBC 23.8*  NEUTROABS 19.9*  HGB 14.6  HCT 42.2  MCV 99.5  PLT 253   Basic Metabolic Panel: Recent Labs  Lab 02/19/18 1327 02/19/18 1922  NA 136  --   K 4.1  --   CL 102  --   CO2 25  --   GLUCOSE 169*  --   BUN 19  --   CREATININE 1.51*  --   CALCIUM 8.8*  --   MG  --  1.5*  PHOS  --  2.0*   GFR: Estimated Creatinine Clearance: 47.8 mL/min (A) (by C-G formula based on SCr of 1.51 mg/dL (H)). Liver Function Tests: Recent Labs  Lab 02/19/18 1327  AST 20  ALT 11  ALKPHOS 60  BILITOT 1.4*  PROT 7.0  ALBUMIN 3.2*   Recent Labs  Lab 02/19/18 1327  LIPASE 23   No results for input(s): AMMONIA in the last 168 hours. Coagulation Profile: No  results for input(s): INR, PROTIME in the last 168 hours. Cardiac Enzymes: Recent Labs  Lab 02/19/18 1327 02/19/18 1922  TROPONINI <0.03 <0.03   BNP (last 3 results) No results for input(s): PROBNP in the last 8760 hours. HbA1C: No results for input(s): HGBA1C in the last 72 hours. CBG: No results for input(s): GLUCAP in the last 168 hours. Lipid Profile: No results for input(s): CHOL, HDL, LDLCALC, TRIG, CHOLHDL, LDLDIRECT in the last 72 hours. Thyroid Function Tests: No results for input(s): TSH, T4TOTAL, FREET4, T3FREE, THYROIDAB in the last 72 hours. Anemia Panel: No results for input(s): VITAMINB12, FOLATE, FERRITIN, TIBC, IRON, RETICCTPCT in the last 72 hours. Urine analysis:    Component Value Date/Time   COLORURINE YELLOW 02/19/2018 [place] 02/19/2018 1735   LABSPEC 1.015 02/19/2018 1735   PHURINE 6.0 02/19/2018 1735   GLUCOSEU NEGATIVE 02/19/2018 1735   HGBUR NEGATIVE 02/19/2018 1735   BILIRUBINUR NEGATIVE 02/19/2018 1735   KETONESUR NEGATIVE 02/19/2018 1735   PROTEINUR NEGATIVE 02/19/2018 1735   UROBILINOGEN 0.2 01/30/2010 1534   NITRITE NEGATIVE 02/19/2018 1735   LEUKOCYTESUR  NEGATIVE 02/19/2018 1735    Radiological Exams on Admission: Dg Chest 2 View  Result Date: 02/19/2018 CLINICAL DATA:  Chest pain. EXAM: CHEST - 2 VIEW COMPARISON:  Chest CT 09/16/2014 FINDINGS: Cardiomediastinal silhouette is normal. Mediastinal contours appear intact. There is no evidence of pneumothorax. Stable changes of severe chronic lung disease with upper lobe predominant emphysema, and areas of pulmonary fibrosis. Chronic left pleural effusion. Osseous structures are without acute abnormality. Soft tissues are grossly normal. IMPRESSION: Stable changes of severe chronic lung disease with upper lobe predominant emphysema and areas of pulmonary fibrosis. Chronic left pleural effusion. Electronically Signed   By: Fidela Salisbury M.D.   On: 02/19/2018 14:14   Ct Chest W Contrast  Result Date: 02/19/2018 CLINICAL DATA:  Acute onset chest pain and nausea and vomiting yesterday. Shortness of breath. Emphysema. Smoker. EXAM: CT CHEST WITH CONTRAST TECHNIQUE: Multidetector CT imaging of the chest was performed during intravenous contrast administration. CONTRAST:  36mL OMNIPAQUE IOHEXOL 300 MG/ML  SOLN COMPARISON:  09/16/2014 FINDINGS: Cardiovascular:  No acute findings. Mediastinum/Nodes: Shotty mediastinal and bilateral hilar lymph nodes show no significant change compared to previous exam. Lungs/Pleura: Moderate centrilobular emphysema is again seen. Subpleural interstitial fibrosis is seen predominately involving the lower lobes, which shows progression since prior exam. No evidence of pulmonary consolidation or mass. Left pleural thickening remains stable as well as bilateral pleural-parenchymal scarring. Upper Abdomen:  Unremarkable. Musculoskeletal:  No suspicious bone lesions. IMPRESSION: Interval progression of bibasilar predominant pulmonary interstitial fibrosis. Stable left pleural thickening and bilateral pleural-parenchymal scarring. Moderate emphysema (ICD10-J43.9). Aortic Atherosclerosis  (ICD10-I70.0). Coronary artery calcification. Electronically Signed   By: Earle Gell M.D.   On: 02/19/2018 16:51    EKG: Independently reviewed.  Vent. rate 83 BPM PR interval * ms QRS duration 84 ms QT/QTc 357/420 ms P-R-T axes 71 74 61 Sinus rhythm Minimal ST elevation, inferior leads  Assessment/Plan Principal Problem:   Sepsis due to undetermined organism [facility]) The patient has pleuritic chest pain, productive cough of greenish sputum worsening dyspnea, decreased appetite, fatigue, malaise, chills and rigors. Continue supplemental oxygen and COPD treatment. Continue CAP antibiotic coverage with azithromycin and ceftriaxone. Check sputum gram stain C&S. Check strep pneumonia urinary antigen.  Active Problems:   COPD with acute exacerbation (HCC) Continue supplemental oxygen. Continue bronchodilators. Continue solumedrol for at least 4 more doses of 40 mg Q 6 hrs.  Pleuritic chest pain   Abnormal EKG Analgesics as needed. Trend troponin level. Repeat EKG in AM. Check echocardiogram tomorrow. Given tobacco use, age, hyperlipidemia, abnormal EKG, coronary atherosclerosis on CT. The patient should have at least a stress test as an outpatient in the near future. The patient was also told about aortic atherosclerosis and the importance of quitting smoking.  He and his wife voiced understanding.    Hyperglycemia On glucocorticoids so fasting glucose may be higher than normal. Carbohydrate modified diet. Check hemoglobin A1C. CCBG monitoring with RI SS while receiving methylprednisolone.    AKI (acute kidney injury) ([place]) Likely from decreased oral intake. Hold Relafen. Continue IVF. Follow up renal; function in AM.    Elevated bilirubin  Hold Simvastatin Follow up level in AM. Consider further work up if persistent despite IV hydration.    Hypercholesterolemia Hold simvastatin tonight. Check LFTs in AM. Resume once bilirubin normalized and the patient feels  better.     Chronic back pain Continue valium 10 mg Q 6 hrs as needed for muscle spasms. Continue Norco 10/325 mg po Q 6 hrs PRN for pain.    Tobacco use Cessation encouraged. Will have staff provide smoking cessation information. Nicotine replacement offered and declined for now. Nicotine replacement therapy ordered PRN.    Hypomagnesemia Replacing. Follow up level as needed.    Hypophosphatemia Replacing. Follow up level as needed.    DVT prophylaxis: Lovenox SQ. Code Status: Full code. Family Communication: His wife was at bedside. Disposition Plan: Admit to stepdown for IV antibiotic therapy for several days. Consults called:  Admission status: Inpatient/SDU.   Reubin Milan MD [facility] Pager [phone].  If 7PM-7AM, please contact night-coverage [email or web address] Password [facility]  02/19/2018, 8:53 PM

## 2018-02-19 NOTE — ED Notes (Signed)
To room with pt fami question regarding IV  IV patent and infusing

## 2018-02-19 NOTE — ED Triage Notes (Signed)
Pt reports that CP started 8pm last pm. Rode tractor all days. Pain continued throughout night. Pain in center of chest and to right. N/V during night

## 2018-02-19 NOTE — ED Provider Notes (Addendum)
[facility] EMERGENCY DEPARTMENT Provider Note   CSN: [id number] Arrival date & time: 02/19/18  1309     History   Chief Complaint Chief Complaint  Patient presents with  . Chest Pain    HPI Jordan May is a 62 y.o. male.  Weakness, feeling cold, nausea, vomiting, chest pain, dyspnea since last night.  Patient smokes 2 packs of cigarettes per day.  Past medical history includes back surgery, hypercholesterolemia.  No hypertension or diabetes.  Severity of symptoms is moderate to severe.  Exertion makes symptoms worse.     Past Medical History:  Diagnosis Date  . Arthritis   . Chronic back pain   . Hypercholesterolemia     There are no active problems to display for this patient.   Past Surgical History:  Procedure Laterality Date  . APPENDECTOMY    . BACK SURGERY    . NECK SURGERY          Home Medications    Prior to Admission medications   Medication Sig Start Date End Date Taking? Authorizing Provider  diazepam (VALIUM) 10 MG tablet Take 10 mg by mouth every 6 (six) hours as needed. Muscle spasm 01/31/14  Yes [provider]  gabapentin (NEURONTIN) 100 MG capsule Take 1 capsule by mouth 3 (three) times daily. 12/27/17  Yes [provider]  HYDROcodone-acetaminophen (NORCO) 10-325 MG per tablet Take 1 tablet by mouth every 6 (six) hours as needed. pain 01/31/14  Yes [provider]  nabumetone (RELAFEN) 750 MG tablet Take 750 mg by mouth 2 (two) times daily. 01/14/14  Yes [provider]  simvastatin (ZOCOR) 10 MG tablet Take 10 mg by mouth at bedtime. 01/31/14  Yes [provider]  triazolam (HALCION) 0.25 MG tablet Take 1 tablet by mouth at bedtime. 01/31/14  Yes [provider]  ondansetron (ZOFRAN) 4 MG tablet Take 1 tablet (4 mg total) by mouth every 8 (eight) hours as needed for nausea or vomiting. 04/30/16   Clayton Bibles, PA-C  traMADol (ULTRAM) 50 MG tablet Take 1 tablet (50 mg total) by mouth every 6 (six)  hours as needed for severe pain. 04/30/16   Clayton Bibles, PA-C    Family History No family history on file.  Social History Social History   Tobacco Use  . Smoking status: Current Every Day Smoker    Packs/day: 2.00    Types: Cigarettes  . Smokeless tobacco: Never Used  Substance Use Topics  . Alcohol use: No  . Drug use: No     Allergies   Oxycodone and Penicillins   Review of Systems Review of Systems  All other systems reviewed and are negative.    Physical Exam Updated Vital Signs BP 95/68   Pulse 81   Temp 99 F (37.2 C) (Oral)   Resp (!) 22   Ht 5\' 8"  (1.727 m)   Wt 68.9 kg   SpO2 (!) 89%   BMI 23.11 kg/m   Physical Exam  Constitutional: He is oriented to person, place, and time. He appears well-developed and well-nourished.  HENT:  Head: Normocephalic and atraumatic.  Eyes: Conjunctivae are normal.  Neck: Neck supple.  Cardiovascular: Normal rate and regular rhythm.  Pulmonary/Chest: Effort normal and breath sounds normal.  Abdominal: Soft. Bowel sounds are normal.  Musculoskeletal: Normal range of motion.  Neurological: He is alert and oriented to person, place, and time.  Skin: Skin is warm and dry.  Psychiatric: He has a normal mood and affect. His behavior  is normal.  Nursing note and vitals reviewed.    ED Treatments / Results  Labs (all labs ordered are listed, but only abnormal results are displayed) Labs Reviewed  BASIC METABOLIC PANEL - Abnormal; Notable for the following components:      Result Value   Glucose, Bld 169 (*)    Creatinine, Ser 1.51 (*)    Calcium 8.8 (*)    GFR calc non Af Amer 47 (*)    GFR calc Af Amer 55 (*)    All other components within normal limits  CBC - Abnormal; Notable for the following components:   WBC 23.8 (*)    MCH 34.4 (*)    All other components within normal limits  HEPATIC FUNCTION PANEL - Abnormal; Notable for the following components:   Albumin 3.2 (*)    Total Bilirubin 1.4 (*)     Indirect Bilirubin 1.2 (*)    All other components within normal limits  TROPONIN I  LIPASE, BLOOD  URINALYSIS, ROUTINE W REFLEX MICROSCOPIC  LACTIC ACID, PLASMA  LACTIC ACID, PLASMA  TROPONIN I    EKG EKG Interpretation  Date/Time:  Sunday February 19 2018 13:30:08 EDT Ventricular Rate:  97 PR Interval:    QRS Duration: 81 QT Interval:  319 QTC Calculation: 406 R Axis:   77 Text Interpretation:  Sinus rhythm Confirmed by Nat Christen [phone]) on 02/19/2018 2:28:55 PM   Radiology Dg Chest 2 View  Result Date: 02/19/2018 CLINICAL DATA:  Chest pain. EXAM: CHEST - 2 VIEW COMPARISON:  Chest CT 09/16/2014 FINDINGS: Cardiomediastinal silhouette is normal. Mediastinal contours appear intact. There is no evidence of pneumothorax. Stable changes of severe chronic lung disease with upper lobe predominant emphysema, and areas of pulmonary fibrosis. Chronic left pleural effusion. Osseous structures are without acute abnormality. Soft tissues are grossly normal. IMPRESSION: Stable changes of severe chronic lung disease with upper lobe predominant emphysema and areas of pulmonary fibrosis. Chronic left pleural effusion. Electronically Signed   By: Fidela Salisbury M.D.   On: 02/19/2018 14:14   Ct Chest W Contrast  Result Date: 02/19/2018 CLINICAL DATA:  Acute onset chest pain and nausea and vomiting yesterday. Shortness of breath. Emphysema. Smoker. EXAM: CT CHEST WITH CONTRAST TECHNIQUE: Multidetector CT imaging of the chest was performed during intravenous contrast administration. CONTRAST:  100mL OMNIPAQUE IOHEXOL 300 MG/ML  SOLN COMPARISON:  09/16/2014 FINDINGS: Cardiovascular:  No acute findings. Mediastinum/Nodes: Shotty mediastinal and bilateral hilar lymph nodes show no significant change compared to previous exam. Lungs/Pleura: Moderate centrilobular emphysema is again seen. Subpleural interstitial fibrosis is seen predominately involving the lower lobes, which shows progression since prior  exam. No evidence of pulmonary consolidation or mass. Left pleural thickening remains stable as well as bilateral pleural-parenchymal scarring. Upper Abdomen:  Unremarkable. Musculoskeletal:  No suspicious bone lesions. IMPRESSION: Interval progression of bibasilar predominant pulmonary interstitial fibrosis. Stable left pleural thickening and bilateral pleural-parenchymal scarring. Moderate emphysema (ICD10-J43.9). Aortic Atherosclerosis (ICD10-I70.0). Coronary artery calcification. Electronically Signed   By: Earle Gell M.D.   On: 02/19/2018 16:51    Procedures Procedures (including critical care time)  Medications Ordered in ED Medications  albuterol (PROVENTIL HFA;VENTOLIN HFA) 108 (90 Base) MCG/ACT inhaler 2 puff (has no administration in time range)  cefTRIAXone (ROCEPHIN) 1 g in sodium chloride 0.9 % 100 mL IVPB (has no administration in time range)  0.9 %  sodium chloride infusion (has no administration in time range)  sodium chloride 0.9 % bolus 1,000 mL (0 mLs Intravenous Stopped 02/19/18 1651)  ondansetron (ZOFRAN) injection 4 mg (4 mg Intravenous Given 02/19/18 1456)  fentaNYL (SUBLIMAZE) injection 50 mcg (50 mcg Intravenous Given 02/19/18 1459)  sodium chloride 0.9 % bolus 1,000 mL (0 mLs Intravenous Stopped 02/19/18 1651)  ipratropium-albuterol (DUONEB) 0.5-2.5 (3) MG/3ML nebulizer solution 3 mL (3 mLs Nebulization Given 02/19/18 1543)  methylPREDNISolone sodium succinate (SOLU-MEDROL) 125 mg/2 mL injection 125 mg (125 mg Intravenous Given 02/19/18 1500)  iohexol (OMNIPAQUE) 300 MG/ML solution 75 mL (75 mLs Intravenous Contrast Given 02/19/18 1626)  azithromycin (ZITHROMAX) tablet 500 mg (500 mg Oral Given 02/19/18 1801)     Initial Impression / Assessment and Plan / ED Course  I have reviewed the triage vital signs and the nursing no chest pain.tes.  Pertinent labs & imaging results that were available during my care of the patient were reviewed by me and considered in my medical decision  making (see chart for details).     Patient presents with weakness, cold chills, chest pain.  He smokes 2 packs of cigarettes per day.  On initial exam he appears dehydrated.  White count 23 K, creatinine 1.5, troponin negative, EKG no acute findings, CT chest pulmonary fibrosis.  Patient was given IV fluids, IV steroids, nebulizer treatment, oral Zithromax, IV Rocephin.  He remained hypotensive and hypoxemic.  Will admit for hydration and and to address respiratory dysfunction.   CRITICAL CARE Performed by: Nat Christen Total critical care time: 40 minutes Critical care time was exclusive of separately billable procedures and treating other patients. Critical care was necessary to treat or prevent imminent or life-threatening deterioration. Critical care was time spent personally by me on the following activities: development of treatment plan with patient and/or surrogate as well as nursing, discussions with consultants, evaluation of patient's response to treatment, examination of patient, obtaining history from patient or surrogate, ordering and performing treatments and interventions, ordering and review of laboratory studies, ordering and review of radiographic studies, pulse oximetry and re-evaluation of patient's condition.  Final Clinical Impressions(s) / ED Diagnoses   Final diagnoses:  Hypotension, unspecified hypotension type  Hypoxemia  Leukocytosis, unspecified type  Pulmonary fibrosis [facility])    ED Discharge Orders    None       Nat Christen, MD 02/19/18 Jeb Levering    Nat Christen, MD 02/19/18 Mauro Kaufmann    Nat Christen, MD 02/19/18 [phone]

## 2018-02-20 ENCOUNTER — Inpatient Hospital Stay (HOSPITAL_COMMUNITY): Payer: Medicare Other

## 2018-02-20 DIAGNOSIS — R739 Hyperglycemia, unspecified: Secondary | ICD-10-CM

## 2018-02-20 DIAGNOSIS — J9601 Acute respiratory failure with hypoxia: Secondary | ICD-10-CM

## 2018-02-20 DIAGNOSIS — M549 Dorsalgia, unspecified: Secondary | ICD-10-CM

## 2018-02-20 DIAGNOSIS — J8410 Pulmonary fibrosis, unspecified: Secondary | ICD-10-CM

## 2018-02-20 DIAGNOSIS — G8929 Other chronic pain: Secondary | ICD-10-CM

## 2018-02-20 DIAGNOSIS — R0781 Pleurodynia: Secondary | ICD-10-CM

## 2018-02-20 DIAGNOSIS — E440 Moderate protein-calorie malnutrition: Secondary | ICD-10-CM

## 2018-02-20 DIAGNOSIS — J9621 Acute and chronic respiratory failure with hypoxia: Secondary | ICD-10-CM

## 2018-02-20 LAB — MRSA PCR SCREENING: MRSA BY PCR: NEGATIVE

## 2018-02-20 LAB — ECHOCARDIOGRAM COMPLETE
HEIGHTINCHES: 68 in
WEIGHTICAEL: 2578.5 [oz_av]

## 2018-02-20 LAB — COMPREHENSIVE METABOLIC PANEL
ALT: 11 U/L (ref 0–44)
ANION GAP: 6 (ref 5–15)
AST: 17 U/L (ref 15–41)
Albumin: 2.5 g/dL — ABNORMAL LOW (ref 3.5–5.0)
Alkaline Phosphatase: 44 U/L (ref 38–126)
BUN: 23 mg/dL (ref 8–23)
CHLORIDE: 110 mmol/L (ref 98–111)
CO2: 22 mmol/L (ref 22–32)
Calcium: 7.8 mg/dL — ABNORMAL LOW (ref 8.9–10.3)
Creatinine, Ser: 1.34 mg/dL — ABNORMAL HIGH (ref 0.61–1.24)
GFR calc Af Amer: >60 mL/min (ref >60)
GFR calc non Af Amer: 54 mL/min — ABNORMAL LOW (ref >60)
GLUCOSE: 193 mg/dL — AB (ref 70–99)
POTASSIUM: 3.4 mmol/L — AB (ref 3.5–5.1)
SODIUM: 138 mmol/L (ref 135–145)
Total Bilirubin: 0.5 mg/dL (ref 0.3–1.2)
Total Protein: 5.8 g/dL — ABNORMAL LOW (ref 6.5–8.1)

## 2018-02-20 LAB — CBC WITH DIFFERENTIAL/PLATELET
BASOS PCT: 0 %
Basophils Absolute: 0 10*3/uL (ref 0.0–0.1)
EOS ABS: 0 10*3/uL (ref 0.0–0.7)
EOS PCT: 0 %
HCT: 33.9 % — ABNORMAL LOW (ref 39.0–52.0)
HEMOGLOBIN: 11.7 g/dL — AB (ref 13.0–17.0)
Lymphocytes Relative: 5 %
Lymphs Abs: 1.1 10*3/uL (ref 0.7–4.0)
MCH: 34.5 pg — AB (ref 26.0–34.0)
MCHC: 34.5 g/dL (ref 30.0–36.0)
MCV: 100 fL (ref 78.0–100.0)
MONOS PCT: 6 %
Monocytes Absolute: 1.2 10*3/uL — ABNORMAL HIGH (ref 0.1–1.0)
NEUTROS ABS: 18.1 10*3/uL — AB (ref 1.7–7.7)
NEUTROS PCT: 89 %
PLATELETS: 137 10*3/uL — AB (ref 150–400)
RBC: 3.39 MIL/uL — ABNORMAL LOW (ref 4.22–5.81)
RDW: 14.1 % (ref 11.5–15.5)
WBC: 20.4 10*3/uL — ABNORMAL HIGH (ref 4.0–10.5)

## 2018-02-20 LAB — GLUCOSE, CAPILLARY
GLUCOSE-CAPILLARY: 142 mg/dL — AB (ref 70–99)
GLUCOSE-CAPILLARY: 209 mg/dL — AB (ref 70–99)
GLUCOSE-CAPILLARY: 244 mg/dL — AB (ref 70–99)
Glucose-Capillary: 128 mg/dL — ABNORMAL HIGH (ref 70–99)

## 2018-02-20 LAB — PROTIME-INR
INR: 1.37
Prothrombin Time: 16.8 seconds — ABNORMAL HIGH (ref 11.4–15.2)

## 2018-02-20 LAB — LACTIC ACID, PLASMA: LACTIC ACID, VENOUS: 2.5 mmol/L — AB (ref 0.5–1.9)

## 2018-02-20 MED ORDER — SIMVASTATIN 10 MG PO TABS
10.0000 mg | ORAL_TABLET | Freq: Every day | ORAL | Status: DC
  Administered 2018-02-20 – 2018-02-23 (×4): 10 mg via ORAL
  Filled 2018-02-20 (×4): qty 1

## 2018-02-20 MED ORDER — METHYLPREDNISOLONE SODIUM SUCC 125 MG IJ SOLR
80.0000 mg | Freq: Three times a day (TID) | INTRAMUSCULAR | Status: DC
  Administered 2018-02-20 – 2018-02-23 (×10): 80 mg via INTRAVENOUS
  Filled 2018-02-20 (×10): qty 2

## 2018-02-20 MED ORDER — BUDESONIDE 0.5 MG/2ML IN SUSP
0.5000 mg | Freq: Two times a day (BID) | RESPIRATORY_TRACT | Status: DC
  Administered 2018-02-20 – 2018-02-24 (×9): 0.5 mg via RESPIRATORY_TRACT
  Filled 2018-02-20 (×8): qty 2

## 2018-02-20 MED ORDER — K PHOS MONO-SOD PHOS DI & MONO 155-852-130 MG PO TABS
250.0000 mg | ORAL_TABLET | Freq: Three times a day (TID) | ORAL | Status: AC
  Administered 2018-02-20 (×2): 250 mg via ORAL
  Filled 2018-02-20 (×2): qty 1

## 2018-02-20 MED ORDER — IPRATROPIUM-ALBUTEROL 0.5-2.5 (3) MG/3ML IN SOLN
3.0000 mL | Freq: Three times a day (TID) | RESPIRATORY_TRACT | Status: DC
  Administered 2018-02-21 – 2018-02-22 (×4): 3 mL via RESPIRATORY_TRACT
  Filled 2018-02-20 (×4): qty 3

## 2018-02-20 MED ORDER — ENSURE ENLIVE PO LIQD
237.0000 mL | Freq: Two times a day (BID) | ORAL | Status: DC
  Administered 2018-02-20 – 2018-02-24 (×8): 237 mL via ORAL

## 2018-02-20 MED ORDER — LACTATED RINGERS IV SOLN
INTRAVENOUS | Status: DC
  Administered 2018-02-20 – 2018-02-22 (×4): via INTRAVENOUS

## 2018-02-20 MED ORDER — GUAIFENESIN ER 600 MG PO TB12
600.0000 mg | ORAL_TABLET | Freq: Two times a day (BID) | ORAL | Status: DC
  Administered 2018-02-20 – 2018-02-24 (×9): 600 mg via ORAL
  Filled 2018-02-20 (×9): qty 1

## 2018-02-20 NOTE — Progress Notes (Signed)
PROGRESS NOTE    Jordan May  WUJ:[id number] DOB: 1955/06/29 DOA: 02/19/2018 PCP: Redmond School, MD     Brief Narrative:  62 y.o. male with medical history significant of arthritis, chronic back pain, emphysema/COPD, hyperlipidemia, tobacco use who is coming to the emergency department with complaints of progressively worse fatigue, chills, malaise, dyspnea, dizziness, pleuritic chest pain due to occasionally productive cough of greenish sputum, decreased appetite and frontal headache since yesterday.  He denies travel history or known sick contacts.  Although, his wife thinks that he may have been exposed to someone with a cold.  No night sweats, rhinorrhea, sore throat, hemoptysis, diaphoresis, PND, orthopnea or pitting edema lower extremities.  No abdominal pain, nausea, vomiting, diarrhea, constipation, melena or hematochezia.  Denies dysuria, frequency, hematuria or oliguria.  No polyuria, polydipsia, polyphagia or blurred vision.  No pruritus or skin rashes.  He has not have any cravings to smoke despite smoking cigarettes 1-1/2 to 2 PPD.  ED Course: Initial vital signs temperature 98.5 F, pulse 101, respirations 17, blood pressure 118/70 mmHg and O2 sat 99% on room air.  The patient was lightheaded in the emergency department and had several SBP readings in the 80s mmHg.  He has received 2 L of NS bolus.    Patient admitted for further evaluation and management of sepsis due to bronchiectasis/developing CAP and acute resp failure with hypoxia due to COPD exacerbation.   Assessment & Plan: 1-Sepsis due to undetermined organism ([place]) -Most likely associated with bronchiectasis and developing community-acquired pneumonia. -Continue current antibiotics -Continue supportive care and wean oxygen supplementation as tolerated. -Blood pressure has remained soft but is stable, continue IV fluids. -Follow-up cultures results.  2-acute resp failure with hypoxia due to COPD  exacerbation -Continue IV steroids, duo nebs, Pulmicort, Mucinex, flutter valve and antibiotics as mentioned above. -Continue oxygen supplementation and titrate off as tolerated if possible. -Increase activity.  3-Hyperglycemia -No prior history of diabetes -Elevated CBGs most likely associated with use of his steroids -Follow A1c -Continue modified carbohydrates and sliding scale insulin.  4-acute kidney injury -Most likely associated with decreased oral intake and low blood pressure -Continue IV fluids -Urinalysis no suggesting UTI -Follow renal function trend -Creatinine trending down. -Minimize use of nephrotoxic agents.  5-Elevated bilirubin -Holding statins currently -Patient denies abdominal pain, nausea or vomiting. -Follow LFTs.  6-hyperlipidemia -LFTs are slightly elevated on admission most likely associated with ongoing sepsis, dehydration and low blood pressure -His statins were held and will repeat LFTs level before resuming it again. -Heart healthy diet has been ordered.  7-tobacco abuse -I have discussed tobacco cessation with the patient.  I have counseled the patient regarding the negative impacts of continued tobacco use including but not limited to lung cancer, COPD, and cardiovascular disease.  I have discussed alternatives to tobacco and modalities that may help facilitate tobacco cessation including but not limited to biofeedback, hypnosis, and medications. Total time spent with tobacco counseling was 5 minutes. -nicotine patch ordered  8-chronic back pain  -As needed Valium for muscle spasm -Continue PRN Norco -Will be cautious with analgesic therapy in the presence of low blood pressure.  9-chest pain: Pleuritic and appears to be noncardiac in nature -Telemetry to be assessed for 24 hours only unless abnormalities appreciated. -So far troponin negative -No acute ischemic changes appreciated on repeat EKG -2D echo  pending.  10-hypomagnesemia/hypophosphatemia -Electrolytes-repleted -Follow trend and further replete as needed.  7-moderate protein calorie malnutrition -Appreciate assistance from nutritional service -Continue feeding supplements.  DVT  prophylaxis: Lovenox Code Status: Full code Family Communication: No family at bedside. Disposition Plan: Patient has remained stable and has not required the use of BiPAP.  Will continue current treatment for COPD exacerbation and most likely bronchiectasis; transfer to telemetry bed for another 24 hours to further assess her activity while following 2D echo results.  Troponin has remained negative.  Wean oxygen supplementation as tolerated.  Consultants:   None  Procedures:   See below for x-ray reports.  2D echo  Antimicrobials:  Anti-infectives (From admission, onward)   Start     Dose/Rate Route Frequency Ordered Stop   02/20/18 1930  cefTRIAXone (ROCEPHIN) 1 g in sodium chloride 0.9 % 100 mL IVPB     1 g 200 mL/hr over 30 Minutes Intravenous Every 24 hours 02/19/18 2117     02/20/18 1800  azithromycin (ZITHROMAX) 500 mg in sodium chloride 0.9 % 250 mL IVPB     500 mg 250 mL/hr over 60 Minutes Intravenous Every 24 hours 02/19/18 2117     02/19/18 1900  cefTRIAXone (ROCEPHIN) 1 g in sodium chloride 0.9 % 100 mL IVPB     1 g 200 mL/hr over 30 Minutes Intravenous  Once 02/19/18 1848 02/19/18 1957   02/19/18 1800  azithromycin (ZITHROMAX) tablet 500 mg     500 mg Oral  Once 02/19/18 1751 02/19/18 1801      Subjective: Feeling improved, still with productive cough, unable to speak in full sentences and having shortness of breath with minimal exertion.  Denies nausea, vomiting, abdominal pain or any other complaints.  He reported pleuritic chest pain reproducible with palpation.  Objective: Vitals:   02/20/18 0511 02/20/18 0721 02/20/18 0833 02/20/18 0834  BP:      Pulse:  77    Resp:      Temp:  97.6 F (36.4 C)    TempSrc:  Oral     SpO2:  94% 94% 95%  Weight: 73.1 kg     Height:        Intake/Output Summary (Last 24 hours) at 02/20/2018 0944 Last data filed at 02/19/2018 2127 Gross per 24 hour  Intake 2150 ml  Output -  Net 2150 ml   Filed Weights   02/19/18 1324 02/20/18 0511  Weight: 68.9 kg 73.1 kg    Examination: General exam: Alert, awake, oriented x 3; patient is currently afebrile, denies nausea and vomiting.  He is still short of breath, having difficulty speaking in full sentences and with ongoing productive cough.  Patient is chronically ill in appearance and underweight. Respiratory system: Positive tachypnea with minimal activity, expiratory wheezing and diffuse rhonchi; no using accessory muscles.  Patient requiring 3 L nasal cannula supplementation to maintain O2 sats above 90%. Cardiovascular system: RRR. No rubs, no gallops; no JVD, no murmurs.. Gastrointestinal system: Abdomen is nondistended, soft and nontender. No organomegaly or masses felt. Normal bowel sounds heard. Central nervous system: Alert and oriented. No focal neurological deficits. Extremities: No C/C/E, +pedal pulses Skin: No open wounds, no rash, no petechiae.  Psychiatry: Judgement and insight appear normal. Mood & affect appropriate.    Data Reviewed: I have personally reviewed following labs and imaging studies  CBC: Recent Labs  Lab 02/19/18 1327 02/20/18 0415  WBC 23.8* 20.4*  NEUTROABS 19.9* 18.1*  HGB 14.6 11.7*  HCT 42.2 33.9*  MCV 99.5 100.0  PLT 153 834*   Basic Metabolic Panel: Recent Labs  Lab 02/19/18 1327 02/19/18 1922 02/20/18 0415  NA 136  --  138  K 4.1  --  3.4*  CL 102  --  110  CO2 25  --  22  GLUCOSE 169*  --  193*  BUN 19  --  23  CREATININE 1.51*  --  1.34*  CALCIUM 8.8*  --  7.8*  MG  --  1.5*  --   PHOS  --  2.0*  --    GFR: Estimated Creatinine Clearance: 53.9 mL/min (A) (by C-G formula based on SCr of 1.34 mg/dL (H)).   Liver Function Tests: Recent Labs  Lab 02/19/18 1327  02/20/18 0415  AST 20 17  ALT 11 11  ALKPHOS 60 44  BILITOT 1.4* 0.5  PROT 7.0 5.8*  ALBUMIN 3.2* 2.5*   Recent Labs  Lab 02/19/18 1327  LIPASE 23   Coagulation Profile: Recent Labs  Lab 02/20/18 0415  INR 1.37   Cardiac Enzymes: Recent Labs  Lab 02/19/18 1327 02/19/18 1922 02/19/18 2226  TROPONINI <0.03 <0.03 <0.03   CBG: Recent Labs  Lab 02/19/18 2217 02/20/18 0720  GLUCAP 162* 142*   Urine analysis:    Component Value Date/Time   COLORURINE YELLOW 02/19/2018 [place] 02/19/2018 1735   LABSPEC 1.015 02/19/2018 1735   PHURINE 6.0 02/19/2018 1735   GLUCOSEU NEGATIVE 02/19/2018 1735   HGBUR NEGATIVE 02/19/2018 1735   BILIRUBINUR NEGATIVE 02/19/2018 1735   KETONESUR NEGATIVE 02/19/2018 1735   PROTEINUR NEGATIVE 02/19/2018 1735   UROBILINOGEN 0.2 01/30/2010 1534   NITRITE NEGATIVE 02/19/2018 1735   LEUKOCYTESUR NEGATIVE 02/19/2018 1735    Recent Results (from the past 240 hour(s))  MRSA PCR Screening     Status: None   Collection Time: 02/19/18  9:36 PM  Result Value Ref Range Status   MRSA by PCR NEGATIVE NEGATIVE Final    Comment:        The GeneXpert MRSA Assay (FDA approved for NASAL specimens only), is one component of a comprehensive MRSA colonization surveillance program. It is not intended to diagnose MRSA infection nor to guide or monitor treatment for MRSA infections. Performed at [facility], [address] [place],  [address]      Radiology Studies: Dg Chest 2 View  Result Date: 02/19/2018 CLINICAL DATA:  Chest pain. EXAM: CHEST - 2 VIEW COMPARISON:  Chest CT 09/16/2014 FINDINGS: Cardiomediastinal silhouette is normal. Mediastinal contours appear intact. There is no evidence of pneumothorax. Stable changes of severe chronic lung disease with upper lobe predominant emphysema, and areas of pulmonary fibrosis. Chronic left pleural effusion. Osseous structures are without acute abnormality. Soft tissues are grossly  normal. IMPRESSION: Stable changes of severe chronic lung disease with upper lobe predominant emphysema and areas of pulmonary fibrosis. Chronic left pleural effusion. Electronically Signed   By: Fidela Salisbury M.D.   On: 02/19/2018 14:14   Ct Chest W Contrast  Result Date: 02/19/2018 CLINICAL DATA:  Acute onset chest pain and nausea and vomiting yesterday. Shortness of breath. Emphysema. Smoker. EXAM: CT CHEST WITH CONTRAST TECHNIQUE: Multidetector CT imaging of the chest was performed during intravenous contrast administration. CONTRAST:  10mL OMNIPAQUE IOHEXOL 300 MG/ML  SOLN COMPARISON:  09/16/2014 FINDINGS: Cardiovascular:  No acute findings. Mediastinum/Nodes: Shotty mediastinal and bilateral hilar lymph nodes show no significant change compared to previous exam. Lungs/Pleura: Moderate centrilobular emphysema is again seen. Subpleural interstitial fibrosis is seen predominately involving the lower lobes, which shows progression since prior exam. No evidence of pulmonary consolidation or mass. Left pleural thickening remains stable as well as bilateral pleural-parenchymal scarring. Upper Abdomen:  Unremarkable. Musculoskeletal:  No suspicious bone lesions. IMPRESSION: Interval progression of bibasilar predominant pulmonary interstitial fibrosis. Stable left pleural thickening and bilateral pleural-parenchymal scarring. Moderate emphysema (ICD10-J43.9). Aortic Atherosclerosis (ICD10-I70.0). Coronary artery calcification. Electronically Signed   By: Earle Gell M.D.   On: 02/19/2018 16:51    Scheduled Meds: . budesonide (PULMICORT) nebulizer solution  0.5 mg Nebulization BID  . enoxaparin (LOVENOX) injection  40 mg Subcutaneous Q24H  . gabapentin  100 mg Oral TID  . guaiFENesin  600 mg Oral BID  . insulin aspart  0-9 Units Subcutaneous TID WC  . ipratropium-albuterol  3 mL Nebulization Q6H  . mouth rinse  15 mL Mouth Rinse BID  . methylPREDNISolone (SOLU-MEDROL) injection  80 mg Intravenous Q8H   . phosphorus  250 mg Oral TID WC   Continuous Infusions: . azithromycin    . cefTRIAXone (ROCEPHIN)  IV    . lactated ringers 100 mL/hr at 02/20/18 0847     LOS: 1 day    Time spent: 35 minutes. Greater than 50% of this time was spent in direct contact with the patient, coordinating care and discussing relevant ongoing clinical issues, including education about sepsis and the need for antibiotics and treatment for underlying fibrosis and COPD exacerbation.    Barton Dubois, MD [facility] Pager [phone]  If 7PM-7AM, please contact night-coverage [email or web address] Password [email or web address] 02/20/2018, 9:44 AM

## 2018-02-20 NOTE — Care Management Note (Signed)
Case Management Note  Patient Details  Name: Jordan May MRN: [id number] Date of Birth: 08-23-1955  Subjective/Objective:      CM consulted for COPD. Chart reviewed. Patient from home, independent. Has PCP-Dr. Gerarda Fraction. Acutely requiring oxygen.              Action/Plan: CM will follow for needs. ? O2 need.   Expected Discharge Date:  02/21/18               Expected Discharge Plan:     In-House Referral:     Discharge planning Services  CM Consult  Post Acute Care Choice:    Choice offered to:     DME Arranged:    DME Agency:     HH Arranged:    HH Agency:     Status of Service:  In process, will continue to follow  If discussed at Long Length of Stay Meetings, dates discussed:    Additional Comments:  Derrill Bagnell, Chauncey Reading, RN 02/20/2018, 8:38 AM

## 2018-02-20 NOTE — Progress Notes (Signed)
*  PRELIMINARY RESULTS* Echocardiogram 2D Echocardiogram has been performed.  Leavy Cella 02/20/2018, 12:06 PM

## 2018-02-20 NOTE — Progress Notes (Signed)
EKG completed and given to nurse.

## 2018-02-20 NOTE — Progress Notes (Signed)
Initial Nutrition Assessment  DOCUMENTATION CODES:   Non-severe (moderate) malnutrition in context of chronic illness  INTERVENTION:   - Ensure Enlive po BID, each supplement provides 350 kcal and 20 grams of protein (chocolate flavor)  NUTRITION DIAGNOSIS:   Moderate Malnutrition related to chronic illness (COPD) as evidenced by mild fat depletion, moderate fat depletion, mild muscle depletion, moderate muscle depletion.  GOAL:   Patient will meet greater than or equal to 90% of their needs  MONITOR:   PO intake, Supplement acceptance, Weight trends, Labs  REASON FOR ASSESSMENT:   Consult Assessment of nutrition requirement/status  ASSESSMENT:   62 year old male who presented to the ED on 9/8 with chest pain. PMH significant for hyperlipidemia, tobacco use, emphysema/COPD, arthritis, and chronic back pain.  Spoke with pt and wife at bedside. Pt states that he would like to go home but that he will probably be transferring "upstairs" tonight.   Pt reports that his appetite varies and states that he ate 100% of the eggs on his meal tray this morning and 0% of the grits due to taste preference. RD encouraged adequate PO intake.  Pt states that he typically eats 1 meal daily and drinks 1 Ensure supplement (chocolate) daily. Pt's meal may include chicken with slaw.  Pt endorses weight loss and reports his UBW as 165 lbs and that he last weighed this "a couple years ago." Pt states his current weight is 152 lbs. Per pt's wife, pt's weight loss has occurred gradually over time. Per weight history in chart, pt's weight been stable between 158-161 lbs over the past 2 years. Most recent recorded weight PTA is from 04/30/16.  Pt denies any chewing or swallowing issues at baseline. Pt denies nausea but endorses chest pain.  Pt states that he is active at baseline with work.  Medications reviewed and include: sliding scale Novolog TID with meals, 250 mg K phos TID with meals today, IV  antibiotics, lactated ringers  Labs reviewed: potassium 3.4 (L), creatinine 1.34 (H), hemoglobin 11.7 (L), HCT 33.9 (L), phosphorus 2.0 (L), magnesium 1.5 (L) CBG's: 142, 162  NUTRITION - FOCUSED PHYSICAL EXAM:    Most Recent Value  Orbital Region  Mild depletion  Upper Arm Region  Moderate depletion  Thoracic and Lumbar Region  Mild depletion  Buccal Region  No depletion  Temple Region  No depletion  Clavicle Bone Region  Mild depletion  Clavicle and Acromion Bone Region  Moderate depletion  Scapular Bone Region  Mild depletion  Dorsal Hand  No depletion  Patellar Region  Moderate depletion  Anterior Thigh Region  Moderate depletion  Posterior Calf Region  Moderate depletion  Edema (RD Assessment)  None  Hair  Reviewed  Eyes  Reviewed  Mouth  Reviewed  Skin  Reviewed  Nails  Reviewed       Diet Order:   Diet Order            Diet heart healthy/carb modified Room service appropriate? Yes; Fluid consistency: Thin  Diet effective now              EDUCATION NEEDS:   Education needs have been addressed  Skin:  Skin Assessment: Reviewed RN Assessment  Last BM:  PTA/unknown  Height:   Ht Readings from Last 1 Encounters:  02/20/18 5\' 8"  (1.727 m)    Weight:   Wt Readings from Last 1 Encounters:  02/20/18 73.1 kg    Ideal Body Weight:  70 kg  BMI:  Body mass index is  24.5 kg/m.  Estimated Nutritional Needs:   Kcal:  1900-2100  Protein:  95-110 grams  Fluid:  1.9-2.1 L    Jordan Face, MS, RD, LDN Pager: [phone] Weekend/After Hours: [phone]

## 2018-02-21 DIAGNOSIS — D72829 Elevated white blood cell count, unspecified: Secondary | ICD-10-CM

## 2018-02-21 LAB — GLUCOSE, CAPILLARY
GLUCOSE-CAPILLARY: 238 mg/dL — AB (ref 70–99)
GLUCOSE-CAPILLARY: 156 mg/dL — AB (ref 70–99)
Glucose-Capillary: 142 mg/dL — ABNORMAL HIGH (ref 70–99)
Glucose-Capillary: 472 mg/dL — ABNORMAL HIGH (ref 70–99)

## 2018-02-21 LAB — BASIC METABOLIC PANEL
Anion gap: 6 (ref 5–15)
Anion gap: 6 (ref 5–15)
BUN: 25 mg/dL — ABNORMAL HIGH (ref 8–23)
BUN: 28 mg/dL — ABNORMAL HIGH (ref 8–23)
CALCIUM: 8 mg/dL — AB (ref 8.9–10.3)
CO2: 23 mmol/L (ref 22–32)
CO2: 24 mmol/L (ref 22–32)
CREATININE: 1.21 mg/dL (ref 0.61–1.24)
Calcium: 8.2 mg/dL — ABNORMAL LOW (ref 8.9–10.3)
Chloride: 111 mmol/L (ref 98–111)
Chloride: 109 mmol/L (ref 98–111)
Creatinine, Ser: 1.15 mg/dL (ref 0.61–1.24)
GFR calc Af Amer: >60 mL/min (ref >60)
GFR calc Af Amer: >60 mL/min (ref >60)
GFR calc non Af Amer: >60 mL/min (ref >60)
Glucose, Bld: 142 mg/dL — ABNORMAL HIGH (ref 70–99)
Glucose, Bld: 140 mg/dL — ABNORMAL HIGH (ref 70–99)
Potassium: 4 mmol/L (ref 3.5–5.1)
Potassium: 4.1 mmol/L (ref 3.5–5.1)
Sodium: 140 mmol/L (ref 135–145)
Sodium: 139 mmol/L (ref 135–145)

## 2018-02-21 LAB — CBC WITH DIFFERENTIAL/PLATELET
Basophils Absolute: 0 10*3/uL (ref 0.0–0.1)
Basophils Relative: 0 %
EOS PCT: 0 %
Eosinophils Absolute: 0 10*3/uL (ref 0.0–0.7)
HCT: 32.5 % — ABNORMAL LOW (ref 39.0–52.0)
Hemoglobin: 11 g/dL — ABNORMAL LOW (ref 13.0–17.0)
LYMPHS ABS: 1.2 10*3/uL (ref 0.7–4.0)
LYMPHS PCT: 6 %
MCH: 33.8 pg (ref 26.0–34.0)
MCHC: 33.8 g/dL (ref 30.0–36.0)
MCV: 100 fL (ref 78.0–100.0)
MONO ABS: 1.1 10*3/uL — AB (ref 0.1–1.0)
Monocytes Relative: 6 %
Neutro Abs: 16.1 10*3/uL — ABNORMAL HIGH (ref 1.7–7.7)
Neutrophils Relative %: 88 %
PLATELETS: 165 10*3/uL (ref 150–400)
RBC: 3.25 MIL/uL — ABNORMAL LOW (ref 4.22–5.81)
RDW: 14.6 % (ref 11.5–15.5)
WBC: 18.3 10*3/uL — ABNORMAL HIGH (ref 4.0–10.5)

## 2018-02-21 LAB — HEMOGLOBIN A1C
Hgb A1c MFr Bld: 5.7 % — ABNORMAL HIGH (ref 4.8–5.6)
Mean Plasma Glucose: 116.89 mg/dL

## 2018-02-21 LAB — HIV ANTIBODY (ROUTINE TESTING W REFLEX): HIV Screen 4th Generation wRfx: NONREACTIVE

## 2018-02-21 MED ORDER — SODIUM CHLORIDE 0.9 % IV SOLN
INTRAVENOUS | Status: AC
  Administered 2018-02-21: 13:00:00 via INTRAVENOUS

## 2018-02-21 NOTE — Progress Notes (Signed)
[facility] PROGRESS NOTE    Progress Note  [name]  TIR:[id number] DOB: 1955-10-08 DOA: 02/19/2018 PCP: Redmond School, MD     Brief Narrative:   Jordan May is an 62 y.o. male past history of arthritis, chronic back pain, emphysema COPD comes into the emergency room complaining of fatigue chill dyspnea and pleuritic chest pain, associated with productive greenish sputum and decreased appetite.  Assessment/Plan:   Sepsis due to undetermined organism [facility]) : Concern about bronchiectasis versus developing community-acquired pneumonia. He was started empirically on antibiotics culture data has remained negative. She has been fluid resuscitated. He still desaturating when placed on room air.  Acute respiratory failure with hypoxia due to COPD exacerbation: In the setting of an infectious etiology. Continue IV steroids, inhalers and antibiotics. Desaturating  when he is placed in room air, will need to reevaluate.  Hyperglycemia: A1c is pending. Continue sliding scale insulin.  Acute kidney injury: Likely prerenal in etiology antihypertensive medications were held, urinalysis showed no signs of UTI. Creatinine has improved with IV fluid hydration.  Continue to hold nephrotoxic agents. COPD with acute exacerbation (HCC)  Elevated bilirubin: Continue to hold statins she has remained asymptomatic. These have improved likely due to sepsis.  Hyperlipidemia: Statins were held due to elevated bilirubin can resume as an outpatient.  Tobacco abuse: Counseling.  Chronic back pain: Continue current medication, pain seems to be controlled. We will consult physical therapy.  Hypomagnesemia and hypophosphatemia: Were repleted orally we will continue to follow closely.  Malnutrition of moderate degree No supplements appreciate nutritional services.  DVT prophylaxis: lovenox Family Communication:Wife Disposition Plan/Barrier to D/C: home in 2-3 days Code Status:     Code Status Orders  (From admission, onward)         Start     Ordered   02/19/18 2047  Full code  Continuous     02/19/18 2049        Code Status History    This patient has a current code status but no historical code status.        IV Access:    Peripheral IV   Procedures and diagnostic studies:   Dg Chest 2 View  Result Date: 02/19/2018 CLINICAL DATA:  Chest pain. EXAM: CHEST - 2 VIEW COMPARISON:  Chest CT 09/16/2014 FINDINGS: Cardiomediastinal silhouette is normal. Mediastinal contours appear intact. There is no evidence of pneumothorax. Stable changes of severe chronic lung disease with upper lobe predominant emphysema, and areas of pulmonary fibrosis. Chronic left pleural effusion. Osseous structures are without acute abnormality. Soft tissues are grossly normal. IMPRESSION: Stable changes of severe chronic lung disease with upper lobe predominant emphysema and areas of pulmonary fibrosis. Chronic left pleural effusion. Electronically Signed   By: Fidela Salisbury M.D.   On: 02/19/2018 14:14   Ct Chest W Contrast  Result Date: 02/19/2018 CLINICAL DATA:  Acute onset chest pain and nausea and vomiting yesterday. Shortness of breath. Emphysema. Smoker. EXAM: CT CHEST WITH CONTRAST TECHNIQUE: Multidetector CT imaging of the chest was performed during intravenous contrast administration. CONTRAST:  56mL OMNIPAQUE IOHEXOL 300 MG/ML  SOLN COMPARISON:  09/16/2014 FINDINGS: Cardiovascular:  No acute findings. Mediastinum/Nodes: Shotty mediastinal and bilateral hilar lymph nodes show no significant change compared to previous exam. Lungs/Pleura: Moderate centrilobular emphysema is again seen. Subpleural interstitial fibrosis is seen predominately involving the lower lobes, which shows progression since prior exam. No evidence of pulmonary consolidation or mass. Left pleural thickening remains stable as well as bilateral pleural-parenchymal scarring. Upper Abdomen:  Unremarkable.  Musculoskeletal:  No suspicious bone lesions. IMPRESSION: Interval progression of bibasilar predominant pulmonary interstitial fibrosis. Stable left pleural thickening and bilateral pleural-parenchymal scarring. Moderate emphysema (ICD10-J43.9). Aortic Atherosclerosis (ICD10-I70.0). Coronary artery calcification. Electronically Signed   By: Earle Gell M.D.   On: 02/19/2018 16:51     Medical Consultants:    None.  Anti-Infectives:   IV Rocephin and azithromycin*  Subjective:    Jordan May he relates his feels slightly better but still weak and tired.  Still D satting when placed on room air.  Objective:    Vitals:   02/20/18 2200 02/21/18 0515 02/21/18 0516 02/21/18 0826  BP: (!) 94/56 (!) 87/53 99/62   Pulse: 89 78 62   Resp: 20 18    Temp: 98.6 F (37 C) 98.3 F (36.8 C)    TempSrc: Oral Oral    SpO2: 95% 90% 92% (!) 83%  Weight:      Height:        Intake/Output Summary (Last 24 hours) at 02/21/2018 1054 Last data filed at 02/21/2018 0621 Gross per 24 hour  Intake 5870.84 ml  Output -  Net 5870.84 ml   Filed Weights   02/19/18 1324 02/20/18 0511  Weight: 68.9 kg 73.1 kg    Exam: General exam: In no acute distress. Respiratory system: Moderate air movement and wheezing bilaterally. Cardiovascular system: S1 & S2 heard, RRR. Gastrointestinal system: Abdomen is nondistended, soft and nontender.  Central nervous system: Alert and oriented. No focal neurological deficits. Extremities: No pedal edema. Skin: No rashes, lesions or ulcers   Data Reviewed:    Labs: Basic Metabolic Panel: Recent Labs  Lab 02/19/18 1327 02/19/18 1922 02/20/18 0415 02/21/18 0523  NA 136  --  138 140  K 4.1  --  3.4* 4.0  CL 102  --  110 111  CO2 25  --  22 23  GLUCOSE 169*  --  193* 142*  BUN 19  --  23 25*  CREATININE 1.51*  --  1.34* 1.21  CALCIUM 8.8*  --  7.8* 8.0*  MG  --  1.5*  --   --   PHOS  --  2.0*  --   --    GFR Estimated Creatinine Clearance: 59.7  mL/min (by C-G formula based on SCr of 1.21 mg/dL). Liver Function Tests: Recent Labs  Lab 02/19/18 1327 02/20/18 0415  AST 20 17  ALT 11 11  ALKPHOS 60 44  BILITOT 1.4* 0.5  PROT 7.0 5.8*  ALBUMIN 3.2* 2.5*   Recent Labs  Lab 02/19/18 1327  LIPASE 23   No results for input(s): AMMONIA in the last 168 hours. Coagulation profile Recent Labs  Lab 02/20/18 0415  INR 1.37    CBC: Recent Labs  Lab 02/19/18 1327 02/20/18 0415 02/21/18 0523  WBC 23.8* 20.4* 18.3*  NEUTROABS 19.9* 18.1* 16.1*  HGB 14.6 11.7* 11.0*  HCT 42.2 33.9* 32.5*  MCV 99.5 100.0 100.0  PLT 153 137* 165   Cardiac Enzymes: Recent Labs  Lab 02/19/18 1327 02/19/18 1922 02/19/18 2226  TROPONINI <0.03 <0.03 <0.03   BNP (last 3 results) No results for input(s): PROBNP in the last 8760 hours. CBG: Recent Labs  Lab 02/20/18 0720 02/20/18 1128 02/20/18 1613 02/20/18 2157 02/21/18 0722  GLUCAP 142* 209* 244* 128* 142*   D-Dimer: No results for input(s): DDIMER in the last 72 hours. Hgb A1c: No results for input(s): HGBA1C in the last 72 hours. Lipid Profile: No results for input(s): CHOL,  HDL, LDLCALC, TRIG, CHOLHDL, LDLDIRECT in the last 72 hours. Thyroid function studies: No results for input(s): TSH, T4TOTAL, T3FREE, THYROIDAB in the last 72 hours.  Invalid input(s): FREET3 Anemia work up: No results for input(s): VITAMINB12, FOLATE, FERRITIN, TIBC, IRON, RETICCTPCT in the last 72 hours. Sepsis Labs: Recent Labs  Lab 02/19/18 1327 02/19/18 1921 02/19/18 2226 02/20/18 0415 02/21/18 0523  WBC 23.8*  --   --  20.4* 18.3*  LATICACIDVEN  --  2.5* 2.6* 2.5*  --    Microbiology Recent Results (from the past 240 hour(s))  MRSA PCR Screening     Status: None   Collection Time: 02/19/18  9:36 PM  Result Value Ref Range Status   MRSA by PCR NEGATIVE NEGATIVE Final    Comment:        The GeneXpert MRSA Assay (FDA approved for NASAL specimens only), is one component of  a comprehensive MRSA colonization surveillance program. It is not intended to diagnose MRSA infection nor to guide or monitor treatment for MRSA infections. Performed at [facility], [address], [place], [place] [address]      Medications:   . budesonide (PULMICORT) nebulizer solution  0.5 mg Nebulization BID  . enoxaparin (LOVENOX) injection  40 mg Subcutaneous Q24H  . feeding supplement (ENSURE ENLIVE)  237 mL Oral BID BM  . gabapentin  100 mg Oral TID  . guaiFENesin  600 mg Oral BID  . insulin aspart  0-9 Units Subcutaneous TID WC  . ipratropium-albuterol  3 mL Nebulization TID  . mouth rinse  15 mL Mouth Rinse BID  . methylPREDNISolone (SOLU-MEDROL) injection  80 mg Intravenous Q8H  . simvastatin  10 mg Oral QHS   Continuous Infusions: . azithromycin 500 mg (02/20/18 1756)  . cefTRIAXone (ROCEPHIN)  IV Stopped (02/20/18 2044)  . lactated ringers 100 mL/hr at 02/21/18 0543      LOS: 2 days   Charlynne Cousins  [facility] Pager [phone]  *Please refer to [email or web address], password [email or web address] to get updated schedule on who will round on this patient, as hospitalists switch teams weekly. If 7PM-7AM, please contact night-coverage at [email or web address], password [email or web address] for any overnight needs.  02/21/2018, 10:54 AM

## 2018-02-22 ENCOUNTER — Inpatient Hospital Stay (HOSPITAL_COMMUNITY): Payer: Medicare Other

## 2018-02-22 DIAGNOSIS — R17 Unspecified jaundice: Secondary | ICD-10-CM

## 2018-02-22 DIAGNOSIS — E7800 Pure hypercholesterolemia, unspecified: Secondary | ICD-10-CM

## 2018-02-22 DIAGNOSIS — J9621 Acute and chronic respiratory failure with hypoxia: Secondary | ICD-10-CM

## 2018-02-22 DIAGNOSIS — N179 Acute kidney failure, unspecified: Secondary | ICD-10-CM

## 2018-02-22 DIAGNOSIS — J441 Chronic obstructive pulmonary disease with (acute) exacerbation: Secondary | ICD-10-CM

## 2018-02-22 LAB — GLUCOSE, CAPILLARY
GLUCOSE-CAPILLARY: 127 mg/dL — AB (ref 70–99)
GLUCOSE-CAPILLARY: 106 mg/dL — AB (ref 70–99)
Glucose-Capillary: 220 mg/dL — ABNORMAL HIGH (ref 70–99)
Glucose-Capillary: 145 mg/dL — ABNORMAL HIGH (ref 70–99)
Glucose-Capillary: 124 mg/dL — ABNORMAL HIGH (ref 70–99)
Glucose-Capillary: 495 mg/dL — ABNORMAL HIGH (ref 70–99)

## 2018-02-22 MED ORDER — IPRATROPIUM-ALBUTEROL 0.5-2.5 (3) MG/3ML IN SOLN
3.0000 mL | Freq: Two times a day (BID) | RESPIRATORY_TRACT | Status: DC
  Administered 2018-02-22 – 2018-02-24 (×4): 3 mL via RESPIRATORY_TRACT
  Filled 2018-02-22 (×5): qty 3

## 2018-02-22 MED ORDER — INSULIN ASPART 100 UNIT/ML ~~LOC~~ SOLN
10.0000 [IU] | Freq: Once | SUBCUTANEOUS | Status: AC
  Administered 2018-02-22: 10 [IU] via SUBCUTANEOUS

## 2018-02-22 MED ORDER — AZITHROMYCIN 250 MG PO TABS
500.0000 mg | ORAL_TABLET | ORAL | Status: DC
  Administered 2018-02-22 – 2018-02-23 (×2): 500 mg via ORAL
  Filled 2018-02-22 (×2): qty 2

## 2018-02-22 NOTE — Care Management Important Message (Signed)
Important Message  Patient Details  Name: Jordan May MRN: [id number] Date of Birth: Jun 19, 1955   Medicare Important Message Given:  Yes    [name] 02/22/2018, 12:31 PM

## 2018-02-22 NOTE — Progress Notes (Signed)
PROGRESS NOTE  Jordan May DQQ:[id number] DOB: 1955-12-25 DOA: 02/19/2018 PCP: Redmond School, MD  Brief History:   62 y.o. male with medical history significant of arthritis, chronic back pain, emphysema/COPD, hyperlipidemia, tobacco use who is coming to the emergency department with complaints of progressively worse fatigue, chills, malaise, dyspnea, dizziness, pleuritic chest pain due to occasionally productive cough of greenish sputum, decreased appetite and frontal headache since 02/18/18.  The patient was started on IV solumedrol, duonebs and IV ceftriaxone and azithromycin with slow improvement.  Assessment/Plan: Acute respiratory failure with hypoxia due to COPD exacerbation: In the setting of underlying pulmonary fibrosis Continue IV steroids, inhalers and antibiotics. Continues to Desaturate  when he is placed in room air. -remains dyspneic with minimal exertion -02/19/2018 CT chest--interval progression of bibasilar pulmonary interstitial fibrosis, stable pleural-parenchymal thickening and bilateral pleural parenchymal scarring -Repeat chest x-ray today as the patient has had slow improvement  COPD Exacerbation -continue pulmicort -continue IV steroids -continue duonebs  Abdominal pain -order CT abd in setting of leukocytosis  Impaired glucose tolerance A1c--5.7 -CBGs elevated due to steroids Continue sliding scale insulin.  Acute kidney injury: Likely prerenal and hemodynamic changes in etiology  -antihypertensive medications were held Creatinine has improved with IV fluid hydration.  Continue to hold nephrotoxic agents. -baseline creatinine 1.1-1.2 -serum creatinine peaked 1.5  SIRS -sepsis ruled out  Elevated bilirubin: These have improved likely due to cholestasis from acute medical illnesss  Hyperlipidemia: Statins were held due to elevated bilirubin can resume as an outpatient.  Tobacco abuse: I have discussed tobacco cessation with the  patient.  I have counseled the patient regarding the negative impacts of continued tobacco use including but not limited to lung cancer, COPD, and cardiovascular disease.  I have discussed alternatives to tobacco and modalities that may help facilitate tobacco cessation including but not limited to biofeedback, hypnosis, and medications.  Total time spent with tobacco counseling was 4 minutes.  Chronic back pain: Continue current medication, pain seems to be controlled. consult physical therapy.  Hypomagnesemia and hypophosphatemia: Were repleted orally we will continue to follow closely. -am mag and phos  Malnutrition of moderate degree No supplements appreciate nutritional services  Chest pain -troponins neg x 3 -02/20/18 Echo--EF of 65-70%, no WMA, PASP 58 -Personally reviewed EKG--sinus rhythm, nonspecific T wave change    Disposition Plan:   Home in 1-2days  Family Communication:   Spouse updated at bedside  Consultants:  none Code Status:  FULL  DVT Prophylaxis:  [place] Lovenox   Procedures: As Listed in Progress Note Above  Antibiotics: Ceftriaxone 9/8>>> azithro 9/8>>>    Subjective: The patient states that his breathing overall 50% better, but he remains quite dyspneic with minimal exertion.  He denies any nausea, vomiting, diarrhea.  He complains of intermittent chest discomfort that is substernal in nature which is mild.  It is intermittent.  He complains of abdominal pain that is generalized.  Objective: Vitals:   02/22/18 0815 02/22/18 1300 02/22/18 1516 02/22/18 1523  BP:  103/63 128/74   Pulse:  80 74   Resp:  20 (!) 24   Temp:  98 F (36.7 C) 99.5 F (37.5 C)   TempSrc:  Oral Oral   SpO2: (!) 88% 93% 92% (!) 89%  Weight:      Height:        Intake/Output Summary (Last 24 hours) at 02/22/2018 1719 Last data filed at 02/22/2018 1600 Gross per 24 hour  Intake 3149.12 ml  Output -  Net 3149.12 ml   Weight change:  Exam:   General:  Pt is  alert, follows commands appropriately, not in acute distress  HEENT: No icterus, No thrush, No neck mass, [place]/AT  Cardiovascular: RRR, S1/S2, no rubs, no gallops  Respiratory: Minimal bibasilar wheeze.  Diminished breath sounds the bases.  Bibasilar crackles.  Abdomen: Soft/+BS, diffuse mild tender, non distended, no guarding  Extremities: mild LE edema, No lymphangitis, No petechiae, No rashes, no synovitis   Data Reviewed: I have personally reviewed following labs and imaging studies Basic Metabolic Panel: Recent Labs  Lab 02/19/18 1327 02/19/18 1922 02/20/18 0415 02/21/18 0523 02/21/18 2224  NA 136  --  138 140 139  K 4.1  --  3.4* 4.0 4.1  CL 102  --  110 111 109  CO2 25  --  22 23 24   GLUCOSE 169*  --  193* 142* 140*  BUN 19  --  23 25* 28*  CREATININE 1.51*  --  1.34* 1.21 1.15  CALCIUM 8.8*  --  7.8* 8.0* 8.2*  MG  --  1.5*  --   --   --   PHOS  --  2.0*  --   --   --    Liver Function Tests: Recent Labs  Lab 02/19/18 1327 02/20/18 0415  AST 20 17  ALT 11 11  ALKPHOS 60 44  BILITOT 1.4* 0.5  PROT 7.0 5.8*  ALBUMIN 3.2* 2.5*   Recent Labs  Lab 02/19/18 1327  LIPASE 23   No results for input(s): AMMONIA in the last 168 hours. Coagulation Profile: Recent Labs  Lab 02/20/18 0415  INR 1.37   CBC: Recent Labs  Lab 02/19/18 1327 02/20/18 0415 02/21/18 0523  WBC 23.8* 20.4* 18.3*  NEUTROABS 19.9* 18.1* 16.1*  HGB 14.6 11.7* 11.0*  HCT 42.2 33.9* 32.5*  MCV 99.5 100.0 100.0  PLT 153 137* 165   Cardiac Enzymes: Recent Labs  Lab 02/19/18 1327 02/19/18 1922 02/19/18 2226  TROPONINI <0.03 <0.03 <0.03   BNP: Invalid input(s): POCBNP CBG: Recent Labs  Lab 02/21/18 2147 02/22/18 0507 02/22/18 0741 02/22/18 1148 02/22/18 1628  GLUCAP 472* 127* 220* 145* 124*   HbA1C: Recent Labs    02/21/18 0523  HGBA1C 5.7*   Urine analysis:    Component Value Date/Time   COLORURINE YELLOW 02/19/2018 1735   APPEARANCEUR CLEAR 02/19/2018 1735     LABSPEC 1.015 02/19/2018 1735   PHURINE 6.0 02/19/2018 1735   GLUCOSEU NEGATIVE 02/19/2018 1735   HGBUR NEGATIVE 02/19/2018 1735   BILIRUBINUR NEGATIVE 02/19/2018 1735   KETONESUR NEGATIVE 02/19/2018 1735   PROTEINUR NEGATIVE 02/19/2018 1735   UROBILINOGEN 0.2 01/30/2010 1534   NITRITE NEGATIVE 02/19/2018 1735   LEUKOCYTESUR NEGATIVE 02/19/2018 1735   Sepsis Labs: @LABRCNTIP (procalcitonin:4,lacticidven:4) ) Recent Results (from the past 240 hour(s))  MRSA PCR Screening     Status: None   Collection Time: 02/19/18  9:36 PM  Result Value Ref Range Status   MRSA by PCR NEGATIVE NEGATIVE Final    Comment:        The GeneXpert MRSA Assay (FDA approved for NASAL specimens only), is one component of a comprehensive MRSA colonization surveillance program. It is not intended to diagnose MRSA infection nor to guide or monitor treatment for MRSA infections. Performed at [facility], [address] [place], [place] [address]      Scheduled Meds: . azithromycin  500 mg Oral Q24H  . budesonide (PULMICORT) nebulizer solution  0.5 mg Nebulization BID  . enoxaparin (LOVENOX) injection  40 mg Subcutaneous Q24H  . feeding supplement (ENSURE ENLIVE)  237 mL Oral BID BM  . gabapentin  100 mg Oral TID  . guaiFENesin  600 mg Oral BID  . insulin aspart  0-9 Units Subcutaneous TID WC  . ipratropium-albuterol  3 mL Nebulization BID  . mouth rinse  15 mL Mouth Rinse BID  . methylPREDNISolone (SOLU-MEDROL) injection  80 mg Intravenous Q8H  . simvastatin  10 mg Oral QHS   Continuous Infusions: . cefTRIAXone (ROCEPHIN)  IV Stopped (02/21/18 1837)    Procedures/Studies: Dg Chest 2 View  Result Date: 02/19/2018 CLINICAL DATA:  Chest pain. EXAM: CHEST - 2 VIEW COMPARISON:  Chest CT 09/16/2014 FINDINGS: Cardiomediastinal silhouette is normal. Mediastinal contours appear intact. There is no evidence of pneumothorax. Stable changes of severe chronic lung disease with upper lobe predominant  emphysema, and areas of pulmonary fibrosis. Chronic left pleural effusion. Osseous structures are without acute abnormality. Soft tissues are grossly normal. IMPRESSION: Stable changes of severe chronic lung disease with upper lobe predominant emphysema and areas of pulmonary fibrosis. Chronic left pleural effusion. Electronically Signed   By: Fidela Salisbury M.D.   On: 02/19/2018 14:14   Ct Chest W Contrast  Result Date: 02/19/2018 CLINICAL DATA:  Acute onset chest pain and nausea and vomiting yesterday. Shortness of breath. Emphysema. Smoker. EXAM: CT CHEST WITH CONTRAST TECHNIQUE: Multidetector CT imaging of the chest was performed during intravenous contrast administration. CONTRAST:  17mL OMNIPAQUE IOHEXOL 300 MG/ML  SOLN COMPARISON:  09/16/2014 FINDINGS: Cardiovascular:  No acute findings. Mediastinum/Nodes: Shotty mediastinal and bilateral hilar lymph nodes show no significant change compared to previous exam. Lungs/Pleura: Moderate centrilobular emphysema is again seen. Subpleural interstitial fibrosis is seen predominately involving the lower lobes, which shows progression since prior exam. No evidence of pulmonary consolidation or mass. Left pleural thickening remains stable as well as bilateral pleural-parenchymal scarring. Upper Abdomen:  Unremarkable. Musculoskeletal:  No suspicious bone lesions. IMPRESSION: Interval progression of bibasilar predominant pulmonary interstitial fibrosis. Stable left pleural thickening and bilateral pleural-parenchymal scarring. Moderate emphysema (ICD10-J43.9). Aortic Atherosclerosis (ICD10-I70.0). Coronary artery calcification. Electronically Signed   By: Earle Gell M.D.   On: 02/19/2018 16:51    Orson Eva, DO  [facility] Pager [phone]  If 7PM-7AM, please contact night-coverage [email or web address] Password [email or web address] 02/22/2018, 5:19 PM   LOS: 3 days

## 2018-02-23 ENCOUNTER — Inpatient Hospital Stay (HOSPITAL_COMMUNITY): Payer: Medicare Other

## 2018-02-23 DIAGNOSIS — I5031 Acute diastolic (congestive) heart failure: Secondary | ICD-10-CM

## 2018-02-23 DIAGNOSIS — A419 Sepsis, unspecified organism: Principal | ICD-10-CM

## 2018-02-23 DIAGNOSIS — J8410 Pulmonary fibrosis, unspecified: Secondary | ICD-10-CM

## 2018-02-23 DIAGNOSIS — Z720 Tobacco use: Secondary | ICD-10-CM

## 2018-02-23 LAB — CBC
HEMATOCRIT: 33.6 % — AB (ref 39.0–52.0)
HEMOGLOBIN: 11.4 g/dL — AB (ref 13.0–17.0)
MCH: 34.2 pg — AB (ref 26.0–34.0)
MCHC: 33.9 g/dL (ref 30.0–36.0)
MCV: 100.9 fL — AB (ref 78.0–100.0)
Platelets: 227 10*3/uL (ref 150–400)
RBC: 3.33 MIL/uL — ABNORMAL LOW (ref 4.22–5.81)
RDW: 14.5 % (ref 11.5–15.5)
WBC: 10.6 10*3/uL — AB (ref 4.0–10.5)

## 2018-02-23 LAB — BASIC METABOLIC PANEL
Anion gap: 6 (ref 5–15)
BUN: 26 mg/dL — AB (ref 8–23)
CHLORIDE: 108 mmol/L (ref 98–111)
CO2: 26 mmol/L (ref 22–32)
CREATININE: 1.05 mg/dL (ref 0.61–1.24)
Calcium: 8.2 mg/dL — ABNORMAL LOW (ref 8.9–10.3)
GFR calc Af Amer: >60 mL/min (ref >60)
GFR calc non Af Amer: >60 mL/min (ref >60)
Glucose, Bld: 131 mg/dL — ABNORMAL HIGH (ref 70–99)
POTASSIUM: 4.1 mmol/L (ref 3.5–5.1)
SODIUM: 140 mmol/L (ref 135–145)

## 2018-02-23 LAB — GLUCOSE, CAPILLARY
GLUCOSE-CAPILLARY: 123 mg/dL — AB (ref 70–99)
Glucose-Capillary: 166 mg/dL — ABNORMAL HIGH (ref 70–99)
Glucose-Capillary: 136 mg/dL — ABNORMAL HIGH (ref 70–99)
Glucose-Capillary: 169 mg/dL — ABNORMAL HIGH (ref 70–99)

## 2018-02-23 LAB — MAGNESIUM: MAGNESIUM: 2.2 mg/dL (ref 1.7–2.4)

## 2018-02-23 LAB — PHOSPHORUS: Phosphorus: 3.2 mg/dL (ref 2.5–4.6)

## 2018-02-23 LAB — BRAIN NATRIURETIC PEPTIDE: B Natriuretic Peptide: 409 pg/mL — ABNORMAL HIGH (ref 0.0–100.0)

## 2018-02-23 MED ORDER — BISACODYL 10 MG RE SUPP
10.0000 mg | Freq: Once | RECTAL | Status: AC
  Administered 2018-02-23: 10 mg via RECTAL
  Filled 2018-02-23: qty 1

## 2018-02-23 MED ORDER — METHYLPREDNISOLONE SODIUM SUCC 125 MG IJ SOLR
80.0000 mg | Freq: Three times a day (TID) | INTRAMUSCULAR | Status: AC
  Administered 2018-02-23: 80 mg via INTRAVENOUS

## 2018-02-23 MED ORDER — PREDNISONE 20 MG PO TABS
60.0000 mg | ORAL_TABLET | Freq: Every day | ORAL | Status: DC
  Administered 2018-02-24: 60 mg via ORAL
  Filled 2018-02-23: qty 3

## 2018-02-23 MED ORDER — POLYETHYLENE GLYCOL 3350 17 G PO PACK
17.0000 g | PACK | Freq: Every day | ORAL | Status: DC
  Administered 2018-02-23 – 2018-02-24 (×2): 17 g via ORAL
  Filled 2018-02-23 (×2): qty 1

## 2018-02-23 MED ORDER — FUROSEMIDE 10 MG/ML IJ SOLN
40.0000 mg | Freq: Once | INTRAMUSCULAR | Status: AC
  Administered 2018-02-23: 40 mg via INTRAVENOUS
  Filled 2018-02-23: qty 4

## 2018-02-23 MED ORDER — SODIUM CHLORIDE 0.9 % IV SOLN
INTRAVENOUS | Status: DC | PRN
  Administered 2018-02-23: 250 mL via INTRAVENOUS

## 2018-02-23 NOTE — Progress Notes (Signed)
PROGRESS NOTE  Jordan May KNL:[id number] DOB: June 10, 1956 DOA: 02/19/2018 PCP: Redmond School, MD  Brief History:  62 y.o.malewith medical history significant ofarthritis, chronic back pain, emphysema/COPD, hyperlipidemia, tobacco use who is coming to the emergency department with complaints of progressively worse fatigue, chills, malaise, dyspnea, dizziness, pleuritic chest pain due to occasionally productive cough of greenish sputum, decreased appetite and frontal headache since 02/18/18.  The patient was started on IV solumedrol, duonebs and IV ceftriaxone and azithromycin with slow improvement.  Assessment/Plan: Acute respiratory failure with hypoxia due to COPD exacerbation: In the setting of underlying pulmonary fibrosis Continue IV steroids, inhalers and antibiotics. Continues to Desaturate when he is placed in room air. -remains dyspneic with minimal exertion -02/19/2018 CT chest--interval progression of bibasilar pulmonary interstitial fibrosis, stable pleural-parenchymal thickening and bilateral pleural parenchymal scarring -Repeat chest x-ray today as the patient has had slow improvement  Sepsis due to undetermined organism [facility]) : -due to pneumonia -continue ceftriaxone and azithromycin -sepsis physiology resolved  Lobar Pneumonia -personally reviewed CXR--new RLL opacity, bilateral pleural effusions -continue ceftriaxone and azithromycin  Acute diastolic CHF -daily weight -start lasix IV  -accurate I/o -02/20/18 Echo-EF 65-70%, no WMA, PASP 58  COPD Exacerbation -continue pulmicort -continue IV steroids -continue duonebs  Abdominal pain -02/22/18 CT abd--bilateral pleural effusions, R>L;  RLL opacity new; mod stool, generalized body wall edema with RLL consolidation; question GB wall thickening -RUQ [place] -start miralax and bisacodyl  Impaired glucose tolerance A1c--5.7 -CBGs elevated due to steroids Continue sliding scale insulin.  Acute kidney  injury: -Likely prerenal and hemodynamic changes in etiology  -antihypertensive medications were held Creatinine has improved with IV fluid hydration. Continue to hold nephrotoxic agents. -baseline creatinine 1.1-1.2 -serum creatinine peaked 1.5  Elevated bilirubin: These have improved likely due to cholestasis from acute medical illnesss  Hyperlipidemia: Statins were held due to elevated bilirubin can resume as an outpatient.  Tobacco abuse: I have discussed tobacco cessation with the patient.  I have counseled the patient regarding the negative impacts of continued tobacco use including but not limited to lung cancer, COPD, and cardiovascular disease.  I have discussed alternatives to tobacco and modalities that may help facilitate tobacco cessation including but not limited to biofeedback, hypnosis, and medications.  Total time spent with tobacco counseling was 4 minutes.  Chronic back pain: Continue current medication, pain seems to be controlled. consult physical therapy.  Hypomagnesemia and hypophosphatemia: Were repleted orally we will continue to follow closely.  Malnutrition of moderate degree No supplements appreciate nutritional services  Chest pain -troponins neg x 3 -02/20/18 Echo--EF of 65-70%, no WMA, PASP 58 -Personally reviewed EKG--sinus rhythm, nonspecific T wave change    Disposition Plan:   Home in 1-2days  Family Communication:   Spouse updated at bedside 9/12--Total time spent 35 minutes.  Greater than 50% spent face to face counseling and coordinating care.   Consultants:  none Code Status:  FULL  DVT Prophylaxis:  Abeytas Lovenox   Procedures: As Listed in Progress Note Above  Antibiotics: Ceftriaxone 9/8>>> azithro 9/8>>>    Subjective:   Objective: Vitals:   02/22/18 2126 02/23/18 0629 02/23/18 0810 02/23/18 0815  BP: 110/64 111/71    Pulse: 76 (!) 58    Resp: 20 20    Temp: 98.4 F (36.9 C) 98.6 F (37 C)    TempSrc:  Oral Oral    SpO2: 93% 92% 91% 94%  Weight:      Height:  Intake/Output Summary (Last 24 hours) at 02/23/2018 1310 Last data filed at 02/23/2018 0630 Gross per 24 hour  Intake 1558.43 ml  Output -  Net 1558.43 ml   Weight change:  Exam:   General:  Pt is alert, follows commands appropriately, not in acute distress  HEENT: No icterus, No thrush, No neck mass, [place]/AT  Cardiovascular: RRR, S1/S2, no rubs, no gallops  Respiratory: CTA bilaterally, no wheezing, no crackles, no rhonchi  Abdomen: Soft/+BS, non tender, non distended, no guarding  Extremities: No edema, No lymphangitis, No petechiae, No rashes, no synovitis   Data Reviewed: I have personally reviewed following labs and imaging studies Basic Metabolic Panel: Recent Labs  Lab 02/19/18 1327 02/19/18 1922 02/20/18 0415 02/21/18 0523 02/21/18 2224 02/23/18 0457  NA 136  --  138 140 139 140  K 4.1  --  3.4* 4.0 4.1 4.1  CL 102  --  110 111 109 108  CO2 25  --  22 23 24 26   GLUCOSE 169*  --  193* 142* 140* 131*  BUN 19  --  23 25* 28* 26*  CREATININE 1.51*  --  1.34* 1.21 1.15 1.05  CALCIUM 8.8*  --  7.8* 8.0* 8.2* 8.2*  MG  --  1.5*  --   --   --  2.2  PHOS  --  2.0*  --   --   --  3.2   Liver Function Tests: Recent Labs  Lab 02/19/18 1327 02/20/18 0415  AST 20 17  ALT 11 11  ALKPHOS 60 44  BILITOT 1.4* 0.5  PROT 7.0 5.8*  ALBUMIN 3.2* 2.5*   Recent Labs  Lab 02/19/18 1327  LIPASE 23   No results for input(s): AMMONIA in the last 168 hours. Coagulation Profile: Recent Labs  Lab 02/20/18 0415  INR 1.37   CBC: Recent Labs  Lab 02/19/18 1327 02/20/18 0415 02/21/18 0523 02/23/18 0457  WBC 23.8* 20.4* 18.3* 10.6*  NEUTROABS 19.9* 18.1* 16.1*  --   HGB 14.6 11.7* 11.0* 11.4*  HCT 42.2 33.9* 32.5* 33.6*  MCV 99.5 100.0 100.0 100.9*  PLT 153 137* 165 227   Cardiac Enzymes: Recent Labs  Lab 02/19/18 1327 02/19/18 1922 02/19/18 2226  TROPONINI <0.03 <0.03 <0.03    BNP: Invalid input(s): POCBNP CBG: Recent Labs  Lab 02/22/18 1628 02/22/18 2059 02/22/18 2314 02/23/18 0754 02/23/18 1128  GLUCAP 124* 495* 106* 123* 166*   HbA1C: Recent Labs    02/21/18 0523  HGBA1C 5.7*   Urine analysis:    Component Value Date/Time   COLORURINE YELLOW 02/19/2018 1735   APPEARANCEUR CLEAR 02/19/2018 1735   LABSPEC 1.015 02/19/2018 1735   PHURINE 6.0 02/19/2018 1735   GLUCOSEU NEGATIVE 02/19/2018 1735   HGBUR NEGATIVE 02/19/2018 1735   BILIRUBINUR NEGATIVE 02/19/2018 1735   KETONESUR NEGATIVE 02/19/2018 1735   PROTEINUR NEGATIVE 02/19/2018 1735   UROBILINOGEN 0.2 01/30/2010 1534   NITRITE NEGATIVE 02/19/2018 1735   LEUKOCYTESUR NEGATIVE 02/19/2018 1735   Sepsis Labs: @LABRCNTIP (procalcitonin:4,lacticidven:4) ) Recent Results (from the past 240 hour(s))  MRSA PCR Screening     Status: None   Collection Time: 02/19/18  9:36 PM  Result Value Ref Range Status   MRSA by PCR NEGATIVE NEGATIVE Final    Comment:        The GeneXpert MRSA Assay (FDA approved for NASAL specimens only), is one component of a comprehensive MRSA colonization surveillance program. It is not intended to diagnose MRSA infection nor to guide or monitor treatment for  MRSA infections. Performed at [facility], [address] [place], [place] [address]      Scheduled Meds: . azithromycin  500 mg Oral Q24H  . bisacodyl  10 mg Rectal Once  . budesonide (PULMICORT) nebulizer solution  0.5 mg Nebulization BID  . enoxaparin (LOVENOX) injection  40 mg Subcutaneous Q24H  . feeding supplement (ENSURE ENLIVE)  237 mL Oral BID BM  . furosemide  40 mg Intravenous Once  . gabapentin  100 mg Oral TID  . guaiFENesin  600 mg Oral BID  . insulin aspart  0-9 Units Subcutaneous TID WC  . ipratropium-albuterol  3 mL Nebulization BID  . mouth rinse  15 mL Mouth Rinse BID  . methylPREDNISolone (SOLU-MEDROL) injection  80 mg Intravenous Q8H  . polyethylene glycol  17 g Oral Daily   . [START ON 02/24/2018] predniSONE  60 mg Oral Q breakfast  . simvastatin  10 mg Oral QHS   Continuous Infusions: . cefTRIAXone (ROCEPHIN)  IV 1 g (02/22/18 2216)    Procedures/Studies: Ct Abdomen Pelvis Wo Contrast  Result Date: 02/22/2018 CLINICAL DATA:  Abdominal pain with leukocytosis. Weakness. Nausea and vomiting. EXAM: CT ABDOMEN AND PELVIS WITHOUT CONTRAST TECHNIQUE: Multidetector CT imaging of the abdomen and pelvis was performed following the standard protocol without IV contrast. COMPARISON:  Abdominopelvic CT 04/30/2016.  Chest CT 02/19/2018 FINDINGS: Lower chest: Increasing right pleural effusion and basilar opacities since prior chest CT. No significant change in left pleural effusion and basilar opacity. Emphysema better characterized on chest CT. Hepatobiliary: Motion artifact through the gallbladder which appears contracted. Possible wall thickening or pericholecystic fluid. No discrete focal hepatic lesion on noncontrast exam. Pancreas: No acute findings allowing for patient motion artifact. Spleen: Normal in size without focal abnormality. Adrenals/Urinary Tract: No adrenal nodule. Bilateral perinephric edema, right greater than left. No hydronephrosis. No urolithiasis. Urinary bladder is partially distended limiting assessment. Stomach/Bowel: Small hiatal hernia. Stomach is unremarkable. No bowel wall thickening, inflammatory change or obstruction. Moderate stool throughout the colon. Appendix not visualized, post appendectomy per history. Vascular/Lymphatic: Aortic atherosclerosis. IVC appears prominent. Multiple small retroperitoneal and mesenteric nodes, not enlarged by size criteria. No bulky adenopathy. Reproductive: Prostate is unremarkable. Other: Generalized body wall edema. Small amount of mesenteric edema. No definite ascites. No free air or intra-abdominal abscess. Small fat containing supraumbilical ventral abdominal wall hernia. Musculoskeletal: Degenerative disc disease  at L4-L5. Mild fascial edema of the included upper extremities along with mild diffuse soft tissue edema. There are no acute or suspicious osseous abnormalities. IMPRESSION: 1. Generalized body wall edema, with mild mesenteric edema and mild fascial edema of the included lower extremities. Suspect third-spacing and possible fluid overload. 2. Contracted gallbladder. Gallbladder wall thickening or pericholecystic fluid of uncertain significance, but may be secondary to fluid overload. 3. Bilateral perinephric edema is nonspecific, but asymmetric and greater on the right. This is likely chronic in the setting of normal urinalysis. 4. New right lung base consolidation and pleural effusions since chest CT 3 days ago, pneumonia versus fluid overload. Left pleural effusion and basilar opacity are unchanged from recent exam. 5.  Aortic Atherosclerosis (ICD10-I70.0). Electronically Signed   By: Keith Rake M.D.   On: 02/22/2018 22:55   Dg Chest 2 View  Result Date: 02/22/2018 CLINICAL DATA:  Fatigue, chills, malaise, dizziness, pleuritic chest pain, occasional productive cough with green sputum, anorexia, and frontal headaches since 02/18/2018, history COPD, pulmonary fibrosis, smoker EXAM: CHEST - 2 VIEW COMPARISON:  02/19/2018 FINDINGS: Normal heart size, mediastinal contours, and pulmonary  vascularity. Biapical scarring. Chronic fibrotic changes at the mid to lower lungs. Superimposed acute infiltrate at RIGHT base. Small bibasilar pleural effusions, question slightly increased. No pneumothorax. Bones unremarkable. IMPRESSION: Pulmonary fibrosis changes at the mid to lower lungs with superimposed acute infiltrate at RIGHT base and probably slightly increased pleural effusions. Electronically Signed   By: Lavonia Dana M.D.   On: 02/22/2018 22:44   Dg Chest 2 View  Result Date: 02/19/2018 CLINICAL DATA:  Chest pain. EXAM: CHEST - 2 VIEW COMPARISON:  Chest CT 09/16/2014 FINDINGS: Cardiomediastinal silhouette is  normal. Mediastinal contours appear intact. There is no evidence of pneumothorax. Stable changes of severe chronic lung disease with upper lobe predominant emphysema, and areas of pulmonary fibrosis. Chronic left pleural effusion. Osseous structures are without acute abnormality. Soft tissues are grossly normal. IMPRESSION: Stable changes of severe chronic lung disease with upper lobe predominant emphysema and areas of pulmonary fibrosis. Chronic left pleural effusion. Electronically Signed   By: Fidela Salisbury M.D.   On: 02/19/2018 14:14   Ct Chest W Contrast  Result Date: 02/19/2018 CLINICAL DATA:  Acute onset chest pain and nausea and vomiting yesterday. Shortness of breath. Emphysema. Smoker. EXAM: CT CHEST WITH CONTRAST TECHNIQUE: Multidetector CT imaging of the chest was performed during intravenous contrast administration. CONTRAST:  1mL OMNIPAQUE IOHEXOL 300 MG/ML  SOLN COMPARISON:  09/16/2014 FINDINGS: Cardiovascular:  No acute findings. Mediastinum/Nodes: Shotty mediastinal and bilateral hilar lymph nodes show no significant change compared to previous exam. Lungs/Pleura: Moderate centrilobular emphysema is again seen. Subpleural interstitial fibrosis is seen predominately involving the lower lobes, which shows progression since prior exam. No evidence of pulmonary consolidation or mass. Left pleural thickening remains stable as well as bilateral pleural-parenchymal scarring. Upper Abdomen:  Unremarkable. Musculoskeletal:  No suspicious bone lesions. IMPRESSION: Interval progression of bibasilar predominant pulmonary interstitial fibrosis. Stable left pleural thickening and bilateral pleural-parenchymal scarring. Moderate emphysema (ICD10-J43.9). Aortic Atherosclerosis (ICD10-I70.0). Coronary artery calcification. Electronically Signed   By: Earle Gell M.D.   On: 02/19/2018 16:51    Orson Eva, DO  [facility] Pager [phone]  If 7PM-7AM, please contact  night-coverage [email or web address] Password [email or web address] 02/23/2018, 1:10 PM   LOS: 4 days

## 2018-02-24 ENCOUNTER — Inpatient Hospital Stay (HOSPITAL_COMMUNITY): Payer: Medicare Other

## 2018-02-24 LAB — BASIC METABOLIC PANEL
Anion gap: 4 — ABNORMAL LOW (ref 5–15)
BUN: 31 mg/dL — ABNORMAL HIGH (ref 8–23)
CHLORIDE: 103 mmol/L (ref 98–111)
CO2: 33 mmol/L — AB (ref 22–32)
Calcium: 8.2 mg/dL — ABNORMAL LOW (ref 8.9–10.3)
Creatinine, Ser: 1.09 mg/dL (ref 0.61–1.24)
GFR calc non Af Amer: >60 mL/min (ref >60)
Glucose, Bld: 115 mg/dL — ABNORMAL HIGH (ref 70–99)
POTASSIUM: 4 mmol/L (ref 3.5–5.1)
SODIUM: 140 mmol/L (ref 135–145)

## 2018-02-24 LAB — GLUCOSE, CAPILLARY
GLUCOSE-CAPILLARY: 96 mg/dL (ref 70–99)
GLUCOSE-CAPILLARY: 113 mg/dL — AB (ref 70–99)

## 2018-02-24 MED ORDER — FUROSEMIDE 40 MG PO TABS
40.0000 mg | ORAL_TABLET | Freq: Once | ORAL | Status: AC
  Administered 2018-02-24: 40 mg via ORAL
  Filled 2018-02-24: qty 1

## 2018-02-24 MED ORDER — PREDNISONE 20 MG PO TABS: 60.0000 mg | ORAL_TABLET | Freq: Every day | ORAL | 0 refills | Status: DC

## 2018-02-24 NOTE — Progress Notes (Signed)
Pt ambulated approx. 150 feet in the hallway. Pt saturations stayed in the 90-91% range, yet the further he ambulated, it dropped to 86%. Pt is at 94% at rest on 2 liters. Will continue to monitor.

## 2018-02-24 NOTE — Care Management (Signed)
Patient Information   Patient Name Jordan May, Jordan May ([id number]) Sex Male DOB 08/01/1955  Room Bed  A331 A331-01  Patient Demographics   Address [address] [place] [address] Phone [phone] [facility]) [phone] (Mobile)  Patient Ethnicity & Race   Ethnic Group Patient Race  Not Hispanic or Latino White or Caucasian  Emergency Contact(s)   Name Relation Home Work Mobile  Seddon,Jannie Spouse [phone]    McCuiston,Margaret Mother [phone]    Documents on File    Status Date Received Description  Documents for the Patient  EMR Patient Summary Not Received    Driver's License Not Received    Historic Radiology Documentation Not Received    Release of Information Not Received    [place] Not Received    Dawn E-Signature HIPAA Notice of Privacy Received 04/17/12   Insurance Card Not Received    Advance Directives/Living Will/HCPOA/POA Not Received    Other Photo ID Not Received    Insurance Card Received 02/19/18 new [facility]care card  Documents for the Encounter  AOB (Assignment of Insurance Benefits) Not Received    E-signature AOB Signed 02/19/18   MEDICARE RIGHTS Not Received    E-signature Medicare Rights Signed 02/19/18   ED Patient Billing Extract   ED PB Billing Extract  Cardiac Monitoring Strip Shift Summary Received 02/19/18   Cardiac Monitoring Strip Received 02/20/18   Ultrasound Received 02/24/18   EKG Received (Deleted) 02/20/18   EKG Received (Deleted) 02/20/18   EKG Received 02/20/18   Admission Information   Attending Provider Admitting Provider Admission Type Admission Date/Time  Tat, Shanon Brow, MD Reubin Milan, MD Emergency 02/19/18 1318  Discharge Date Hospital Service Auth/Cert Status Service Area   Internal Medicine Incomplete [facility]  Unit Room/Bed Admission Status   AP-DEPT 300 A331/A331-01 Admission (Confirmed)   Admission   [facility] Account   Name  Acct ID Class Status Primary Coverage  Jordan May, Jordan May [id number] Inpatient Open MEDICARE - MEDICARE PART A AND B      Guarantor Account (for Hospital Account [id number])   Name Relation to Pt Service Area Active? Acct Type  Jordan May Self CHSA Yes Personal/Family  Address Phone    [address] [place], [place] [address] [phone])        Coverage Information (for Hospital Account [id number])   1. [place] PART A AND B   F/O Payor/Plan Precert #  MEDICARE/MEDICARE PART A AND B   Subscriber Subscriber #  Jordan May, Jordan May [id number]  Address Phone  [address] [place], [place] [address]   2. UNITED HEALTHCARE/UNITED HEALTHCARE OTHER   F/O Payor/Plan Precert #  [facility] OTHER   Subscriber Subscriber #  Jordan May, Jordan May [id number]  Address Phone  [address] [place] [address] [phone]

## 2018-02-24 NOTE — Progress Notes (Signed)
IV removed, WNL. D/C instructions given to pt and spouse, verbalized understanding. Pt spouse at bedside to transport home. Home oxygen delivered to bedside by [facility].

## 2018-02-24 NOTE — Progress Notes (Signed)
SATURATION QUALIFICATIONS: (This note is used to comply with regulatory documentation for home oxygen)  Patient Saturations on Room Air at Rest = 88%  Patient Saturations on Room Air while Ambulating = 86%  Patient Saturations on 2 Liters of oxygen while Ambulating = 94%  Please briefly explain why patient needs home oxygen: Pt desat's when ambulating without oxygen.

## 2018-02-24 NOTE — Care Management Note (Signed)
Case Management Note  Patient Details  Name: ZANDON TALTON MRN: [id number] Date of Birth: 08/08/1955  Expected Discharge Date:  02/21/18               Expected Discharge Plan:  Home/Self Care  In-House Referral:  NA  Discharge planning Services  CM Consult  Post Acute Care Choice:  Durable Medical Equipment Choice offered to:  Patient  DME Arranged:  Oxygen, Nebulizer machine DME Agency:  [facility]  HH Arranged:    [facility] Agency:     Status of Service:  Completed, signed off  If discussed at [employer], dates discussed:    Additional Comments: Pt will need home O2 and neb machine at DC. DME provider options given and pt/wife choose [facility] for DME. Referral routed to [facility] and port oxygen system will be delivered to pt room prior to DC. Sherald Barge, RN 02/24/2018, 12:40 PM

## 2018-02-24 NOTE — Care Management Important Message (Signed)
Important Message  Patient Details  Name: Jordan May MRN: [id number] Date of Birth: 06/03/1956   Medicare Important Message Given:  Yes    [name] 02/24/2018, 10:17 AM

## 2018-02-28 DIAGNOSIS — Z6824 Body mass index (BMI) 24.0-24.9, adult: Secondary | ICD-10-CM | POA: Diagnosis not present

## 2018-02-28 DIAGNOSIS — Z1389 Encounter for screening for other disorder: Secondary | ICD-10-CM | POA: Diagnosis not present

## 2018-02-28 DIAGNOSIS — J449 Chronic obstructive pulmonary disease, unspecified: Secondary | ICD-10-CM | POA: Diagnosis not present

## 2018-02-28 DIAGNOSIS — G8929 Other chronic pain: Secondary | ICD-10-CM | POA: Diagnosis not present

## 2018-02-28 DIAGNOSIS — R0600 Dyspnea, unspecified: Secondary | ICD-10-CM | POA: Diagnosis not present

## 2018-02-28 DIAGNOSIS — I5031 Acute diastolic (congestive) heart failure: Secondary | ICD-10-CM | POA: Diagnosis not present

## 2018-03-05 DIAGNOSIS — R112 Nausea with vomiting, unspecified: Secondary | ICD-10-CM | POA: Diagnosis not present

## 2018-03-10 DIAGNOSIS — J449 Chronic obstructive pulmonary disease, unspecified: Secondary | ICD-10-CM | POA: Diagnosis not present

## 2018-03-10 DIAGNOSIS — F17290 Nicotine dependence, other tobacco product, uncomplicated: Secondary | ICD-10-CM | POA: Diagnosis not present

## 2018-03-10 DIAGNOSIS — R0902 Hypoxemia: Secondary | ICD-10-CM | POA: Diagnosis not present

## 2018-03-10 DIAGNOSIS — J189 Pneumonia, unspecified organism: Secondary | ICD-10-CM | POA: Diagnosis not present

## 2018-03-10 DIAGNOSIS — Z719 Counseling, unspecified: Secondary | ICD-10-CM | POA: Diagnosis not present

## 2018-03-10 DIAGNOSIS — Z6824 Body mass index (BMI) 24.0-24.9, adult: Secondary | ICD-10-CM | POA: Diagnosis not present

## 2018-03-10 DIAGNOSIS — J9610 Chronic respiratory failure, unspecified whether with hypoxia or hypercapnia: Secondary | ICD-10-CM | POA: Diagnosis not present

## 2018-03-11 NOTE — Discharge Summary (Signed)
Physician Discharge Summary  Jordan May:[id number] DOB: [birth year] DOA: 02/19/2018  PCP: Redmond School, MD  Admit date: 02/19/2018 Discharge date: 02/24/2018  Admitted From: Home Disposition:  Home   Recommendations for Outpatient Follow-up:  1. Follow up with PCP in 1-2 weeks 2. Please obtain BMP/CBC in one week   Home Health: YES Equipment/Devices: 2L oxygen  Discharge Condition: Stable CODE STATUS: FULL Diet recommendation: Heart Healthy  Brief/Interim Summary: 62 y.o.malewith medical history significant ofarthritis, chronic back pain, emphysema/COPD, hyperlipidemia, tobacco use who is coming to the emergency department with complaints of progressively worse fatigue, chills, malaise, dyspnea, dizziness, pleuritic chest pain due to occasionally productive cough of greenish sputum, decreased appetite and frontal headache since9/7/19. The patient was started on IV solumedrol, duonebs and IV ceftriaxone and azithromycin with slow improvement.  Discharge Diagnoses:  Acute respiratory failure with hypoxia due to COPD exacerbation: In the setting ofunderlying pulmonary fibrosis Continue IV steroids, inhalers and antibiotics. Continues toDesaturatewhen he is placed in room air. -remains dyspneic with minimal exertion -02/19/2018 CT chest--interval progression of bibasilar pulmonary interstitial fibrosis, stable pleural-parenchymal thickening and bilateral pleural parenchymal scarring -oxygen saturation < 99% with ambulation-->set up 2 L for home  Sepsis due to undetermined organism [facility]): -due to pneumonia -continue ceftriaxone and azithromycin -sepsis physiology resolved  Lobar Pneumonia -personally reviewed CXR--new RLL opacity, bilateral pleural effusions -continue ceftriaxone and azithromycin  Acute diastolic CHF -daily weight -started lasix IV  -accurate I/o -02/20/18 Echo-EF 65-70%, no WMA, PASP 58  COPD Exacerbation -continue pulmicort -continue IV  steroids -continue duonebs  Abdominal pain -02/22/18 CT abd--bilateral pleural effusions, R>L;  RLL opacity new; mod stool, generalized body wall edema with RLL consolidation; question GB wall thickening -RUQ [place] -start miralax and bisacodyl  Impaired glucose tolerance A1c--5.7 -CBGs elevated due to steroids Continue sliding scale insulin.  Acute kidney injury: -Likely prerenaland hemodynamic changesin etiology  -antihypertensive medications were held Creatinine has improved with IV fluid hydration. Continue to hold nephrotoxic agents. -baseline creatinine 1.1-1.2 -serum creatinine peaked 1.5  Elevated bilirubin: These have improved likely due tocholestasis from acute medical illnesss  Hyperlipidemia: Statins were held due to elevated bilirubin can resume as an outpatient.  Tobacco abuse: I have discussed tobacco cessation with the patient. I have counseled the patient regarding the negative impacts of continued tobacco use including but not limited to lung cancer, COPD, and cardiovascular disease. I have discussed alternatives to tobacco and modalities that may help facilitate tobacco cessation including but not limited to biofeedback, hypnosis, and medications. Total time spent with tobacco counseling was 4 minutes.  Chronic back pain: Continue current medication, pain seems to be controlled. consult physical therapy.  Hypomagnesemia and hypophosphatemia: Were repleted orally we will continue to follow closely.  Malnutrition of moderate degree No supplements appreciate nutritional services  Chest pain -troponins neg x 3 -02/20/18 Echo--EF of 65-70%, no WMA, PASP 58 -Personally reviewed EKG--sinus rhythm, nonspecific T wave change     Discharge Instructions   Allergies as of 02/24/2018      Reactions   Oxycodone    hallucinations   Penicillins Other (See Comments)   Unknown reaction      Medication List    STOP taking these medications     nabumetone 750 MG tablet Commonly known as:  RELAFEN     TAKE these medications   diazepam 10 MG tablet Commonly known as:  VALIUM Take 10 mg by mouth every 6 (six) hours as needed. Muscle spasm   gabapentin 100 MG capsule Commonly  known as:  NEURONTIN Take 1 capsule by mouth 3 (three) times daily.   HYDROcodone-acetaminophen 10-325 MG tablet Commonly known as:  NORCO Take 1 tablet by mouth every 6 (six) hours as needed. pain   ondansetron 4 MG tablet Commonly known as:  ZOFRAN Take 1 tablet (4 mg total) by mouth every 8 (eight) hours as needed for nausea or vomiting.   predniSONE 20 MG tablet Commonly known as:  DELTASONE Take 3 tablets (60 mg total) by mouth daily with breakfast. And decrease by one tablet daily   simvastatin 10 MG tablet Commonly known as:  ZOCOR Take 10 mg by mouth at bedtime.   triazolam 0.25 MG tablet Commonly known as:  HALCION Take 1 tablet by mouth at bedtime.      Follow-up Information    [facility] Follow up on 02/28/2018.   Specialty:  Family Medicine Why:  10:30 AM Contact information: [place] [address] [phone]          Allergies  Allergen Reactions  . Oxycodone     hallucinations  . Penicillins Other (See Comments)    Unknown reaction    Consultations:  None     Procedures/Studies: Ct Abdomen Pelvis Wo Contrast  Result Date: 02/22/2018 CLINICAL DATA:  Abdominal pain with leukocytosis. Weakness. Nausea and vomiting. EXAM: CT ABDOMEN AND PELVIS WITHOUT CONTRAST TECHNIQUE: Multidetector CT imaging of the abdomen and pelvis was performed following the standard protocol without IV contrast. COMPARISON:  Abdominopelvic CT 04/30/2016.  Chest CT 02/19/2018 FINDINGS: Lower chest: Increasing right pleural effusion and basilar opacities since prior chest CT. No significant change in left pleural effusion and basilar opacity. Emphysema better characterized on chest CT.  Hepatobiliary: Motion artifact through the gallbladder which appears contracted. Possible wall thickening or pericholecystic fluid. No discrete focal hepatic lesion on noncontrast exam. Pancreas: No acute findings allowing for patient motion artifact. Spleen: Normal in size without focal abnormality. Adrenals/Urinary Tract: No adrenal nodule. Bilateral perinephric edema, right greater than left. No hydronephrosis. No urolithiasis. Urinary bladder is partially distended limiting assessment. Stomach/Bowel: Small hiatal hernia. Stomach is unremarkable. No bowel wall thickening, inflammatory change or obstruction. Moderate stool throughout the colon. Appendix not visualized, post appendectomy per history. Vascular/Lymphatic: Aortic atherosclerosis. IVC appears prominent. Multiple small retroperitoneal and mesenteric nodes, not enlarged by size criteria. No bulky adenopathy. Reproductive: Prostate is unremarkable. Other: Generalized body wall edema. Small amount of mesenteric edema. No definite ascites. No free air or intra-abdominal abscess. Small fat containing supraumbilical ventral abdominal wall hernia. Musculoskeletal: Degenerative disc disease at L4-L5. Mild fascial edema of the included upper extremities along with mild diffuse soft tissue edema. There are no acute or suspicious osseous abnormalities. IMPRESSION: 1. Generalized body wall edema, with mild mesenteric edema and mild fascial edema of the included lower extremities. Suspect third-spacing and possible fluid overload. 2. Contracted gallbladder. Gallbladder wall thickening or pericholecystic fluid of uncertain significance, but may be secondary to fluid overload. 3. Bilateral perinephric edema is nonspecific, but asymmetric and greater on the right. This is likely chronic in the setting of normal urinalysis. 4. New right lung base consolidation and pleural effusions since chest CT 3 days ago, pneumonia versus fluid overload. Left pleural effusion and  basilar opacity are unchanged from recent exam. 5.  Aortic Atherosclerosis (ICD10-I70.0). Electronically Signed   By: Keith Rake M.D.   On: 02/22/2018 22:55   Dg Chest 2 View  Result Date: 02/22/2018 CLINICAL DATA:  Fatigue, chills, malaise, dizziness, pleuritic  chest pain, occasional productive cough with green sputum, anorexia, and frontal headaches since 02/18/2018, history COPD, pulmonary fibrosis, smoker EXAM: CHEST - 2 VIEW COMPARISON:  02/19/2018 FINDINGS: Normal heart size, mediastinal contours, and pulmonary vascularity. Biapical scarring. Chronic fibrotic changes at the mid to lower lungs. Superimposed acute infiltrate at RIGHT base. Small bibasilar pleural effusions, question slightly increased. No pneumothorax. Bones unremarkable. IMPRESSION: Pulmonary fibrosis changes at the mid to lower lungs with superimposed acute infiltrate at RIGHT base and probably slightly increased pleural effusions. Electronically Signed   By: Lavonia Dana M.D.   On: 02/22/2018 22:44   Dg Chest 2 View  Result Date: 02/19/2018 CLINICAL DATA:  Chest pain. EXAM: CHEST - 2 VIEW COMPARISON:  Chest CT 09/16/2014 FINDINGS: Cardiomediastinal silhouette is normal. Mediastinal contours appear intact. There is no evidence of pneumothorax. Stable changes of severe chronic lung disease with upper lobe predominant emphysema, and areas of pulmonary fibrosis. Chronic left pleural effusion. Osseous structures are without acute abnormality. Soft tissues are grossly normal. IMPRESSION: Stable changes of severe chronic lung disease with upper lobe predominant emphysema and areas of pulmonary fibrosis. Chronic left pleural effusion. Electronically Signed   By: Fidela Salisbury M.D.   On: 02/19/2018 14:14   Ct Chest W Contrast  Result Date: 02/19/2018 CLINICAL DATA:  Acute onset chest pain and nausea and vomiting yesterday. Shortness of breath. Emphysema. Smoker. EXAM: CT CHEST WITH CONTRAST TECHNIQUE: Multidetector CT imaging of  the chest was performed during intravenous contrast administration. CONTRAST:  35mL OMNIPAQUE IOHEXOL 300 MG/ML  SOLN COMPARISON:  09/16/2014 FINDINGS: Cardiovascular:  No acute findings. Mediastinum/Nodes: Shotty mediastinal and bilateral hilar lymph nodes show no significant change compared to previous exam. Lungs/Pleura: Moderate centrilobular emphysema is again seen. Subpleural interstitial fibrosis is seen predominately involving the lower lobes, which shows progression since prior exam. No evidence of pulmonary consolidation or mass. Left pleural thickening remains stable as well as bilateral pleural-parenchymal scarring. Upper Abdomen:  Unremarkable. Musculoskeletal:  No suspicious bone lesions. IMPRESSION: Interval progression of bibasilar predominant pulmonary interstitial fibrosis. Stable left pleural thickening and bilateral pleural-parenchymal scarring. Moderate emphysema (ICD10-J43.9). Aortic Atherosclerosis (ICD10-I70.0). Coronary artery calcification. Electronically Signed   By: Earle Gell M.D.   On: 02/19/2018 16:51   US Abdomen Limited Ruq  Result Date: 02/24/2018 CLINICAL DATA:  Abdominal pain, leukocytosis EXAM: ULTRASOUND ABDOMEN LIMITED RIGHT UPPER QUADRANT COMPARISON:  02/22/2018 CT FINDINGS: Gallbladder: No gallstones or wall thickening visualized. No sonographic Murphy sign noted by sonographer. No visible pericholecystic fluid. Common bile duct: Diameter: Normal caliber, 3-4 mm Liver: No focal lesion identified. Within normal limits in parenchymal echogenicity. Portal vein is patent on color Doppler imaging with normal direction of blood flow towards the liver. Small right pleural effusion noted. IMPRESSION: No evidence of cholecystitis or cholelithiasis. Small right effusion. Electronically Signed   By: Rolm Baptise M.D.   On: 02/24/2018 09:33        Discharge Exam: Vitals:   02/24/18 1219 02/24/18 1502  BP:  129/61  Pulse:  75  Resp:  (!) 22  Temp:  98.6 F (37 C)    SpO2: 95% 93%   Vitals:   02/24/18 1211 02/24/18 1216 02/24/18 1219 02/24/18 1502  BP:    129/61  Pulse:    75  Resp:    (!) 22  Temp:    98.6 F (37 C)  TempSrc:    Oral  SpO2: 92% (!) 86% 95% 93%  Weight:      Height:  General: Pt is alert, awake, not in acute distress Cardiovascular: RRR, S1/S2 +, no rubs, no gallops Respiratory: CTA bilaterally, no wheezing, no rhonchi Abdominal: Soft, NT, ND, bowel sounds + Extremities: no edema, no cyanosis   The results of significant diagnostics from this hospitalization (including imaging, microbiology, ancillary and laboratory) are listed below for reference.    Significant Diagnostic Studies: Ct Abdomen Pelvis Wo Contrast  Result Date: 02/22/2018 CLINICAL DATA:  Abdominal pain with leukocytosis. Weakness. Nausea and vomiting. EXAM: CT ABDOMEN AND PELVIS WITHOUT CONTRAST TECHNIQUE: Multidetector CT imaging of the abdomen and pelvis was performed following the standard protocol without IV contrast. COMPARISON:  Abdominopelvic CT 04/30/2016.  Chest CT 02/19/2018 FINDINGS: Lower chest: Increasing right pleural effusion and basilar opacities since prior chest CT. No significant change in left pleural effusion and basilar opacity. Emphysema better characterized on chest CT. Hepatobiliary: Motion artifact through the gallbladder which appears contracted. Possible wall thickening or pericholecystic fluid. No discrete focal hepatic lesion on noncontrast exam. Pancreas: No acute findings allowing for patient motion artifact. Spleen: Normal in size without focal abnormality. Adrenals/Urinary Tract: No adrenal nodule. Bilateral perinephric edema, right greater than left. No hydronephrosis. No urolithiasis. Urinary bladder is partially distended limiting assessment. Stomach/Bowel: Small hiatal hernia. Stomach is unremarkable. No bowel wall thickening, inflammatory change or obstruction. Moderate stool throughout the colon. Appendix not visualized,  post appendectomy per history. Vascular/Lymphatic: Aortic atherosclerosis. IVC appears prominent. Multiple small retroperitoneal and mesenteric nodes, not enlarged by size criteria. No bulky adenopathy. Reproductive: Prostate is unremarkable. Other: Generalized body wall edema. Small amount of mesenteric edema. No definite ascites. No free air or intra-abdominal abscess. Small fat containing supraumbilical ventral abdominal wall hernia. Musculoskeletal: Degenerative disc disease at L4-L5. Mild fascial edema of the included upper extremities along with mild diffuse soft tissue edema. There are no acute or suspicious osseous abnormalities. IMPRESSION: 1. Generalized body wall edema, with mild mesenteric edema and mild fascial edema of the included lower extremities. Suspect third-spacing and possible fluid overload. 2. Contracted gallbladder. Gallbladder wall thickening or pericholecystic fluid of uncertain significance, but may be secondary to fluid overload. 3. Bilateral perinephric edema is nonspecific, but asymmetric and greater on the right. This is likely chronic in the setting of normal urinalysis. 4. New right lung base consolidation and pleural effusions since chest CT 3 days ago, pneumonia versus fluid overload. Left pleural effusion and basilar opacity are unchanged from recent exam. 5.  Aortic Atherosclerosis (ICD10-I70.0). Electronically Signed   By: Keith Rake M.D.   On: 02/22/2018 22:55   Dg Chest 2 View  Result Date: 02/22/2018 CLINICAL DATA:  Fatigue, chills, malaise, dizziness, pleuritic chest pain, occasional productive cough with green sputum, anorexia, and frontal headaches since 02/18/2018, history COPD, pulmonary fibrosis, smoker EXAM: CHEST - 2 VIEW COMPARISON:  02/19/2018 FINDINGS: Normal heart size, mediastinal contours, and pulmonary vascularity. Biapical scarring. Chronic fibrotic changes at the mid to lower lungs. Superimposed acute infiltrate at RIGHT base. Small bibasilar  pleural effusions, question slightly increased. No pneumothorax. Bones unremarkable. IMPRESSION: Pulmonary fibrosis changes at the mid to lower lungs with superimposed acute infiltrate at RIGHT base and probably slightly increased pleural effusions. Electronically Signed   By: Lavonia Dana M.D.   On: 02/22/2018 22:44   Dg Chest 2 View  Result Date: 02/19/2018 CLINICAL DATA:  Chest pain. EXAM: CHEST - 2 VIEW COMPARISON:  Chest CT 09/16/2014 FINDINGS: Cardiomediastinal silhouette is normal. Mediastinal contours appear intact. There is no evidence of pneumothorax. Stable changes of severe chronic lung disease  with upper lobe predominant emphysema, and areas of pulmonary fibrosis. Chronic left pleural effusion. Osseous structures are without acute abnormality. Soft tissues are grossly normal. IMPRESSION: Stable changes of severe chronic lung disease with upper lobe predominant emphysema and areas of pulmonary fibrosis. Chronic left pleural effusion. Electronically Signed   By: Fidela Salisbury M.D.   On: 02/19/2018 14:14   Ct Chest W Contrast  Result Date: 02/19/2018 CLINICAL DATA:  Acute onset chest pain and nausea and vomiting yesterday. Shortness of breath. Emphysema. Smoker. EXAM: CT CHEST WITH CONTRAST TECHNIQUE: Multidetector CT imaging of the chest was performed during intravenous contrast administration. CONTRAST:  21mL OMNIPAQUE IOHEXOL 300 MG/ML  SOLN COMPARISON:  09/16/2014 FINDINGS: Cardiovascular:  No acute findings. Mediastinum/Nodes: Shotty mediastinal and bilateral hilar lymph nodes show no significant change compared to previous exam. Lungs/Pleura: Moderate centrilobular emphysema is again seen. Subpleural interstitial fibrosis is seen predominately involving the lower lobes, which shows progression since prior exam. No evidence of pulmonary consolidation or mass. Left pleural thickening remains stable as well as bilateral pleural-parenchymal scarring. Upper Abdomen:  Unremarkable.  Musculoskeletal:  No suspicious bone lesions. IMPRESSION: Interval progression of bibasilar predominant pulmonary interstitial fibrosis. Stable left pleural thickening and bilateral pleural-parenchymal scarring. Moderate emphysema (ICD10-J43.9). Aortic Atherosclerosis (ICD10-I70.0). Coronary artery calcification. Electronically Signed   By: Earle Gell M.D.   On: 02/19/2018 16:51   US Abdomen Limited Ruq  Result Date: 02/24/2018 CLINICAL DATA:  Abdominal pain, leukocytosis EXAM: ULTRASOUND ABDOMEN LIMITED RIGHT UPPER QUADRANT COMPARISON:  02/22/2018 CT FINDINGS: Gallbladder: No gallstones or wall thickening visualized. No sonographic Murphy sign noted by sonographer. No visible pericholecystic fluid. Common bile duct: Diameter: Normal caliber, 3-4 mm Liver: No focal lesion identified. Within normal limits in parenchymal echogenicity. Portal vein is patent on color Doppler imaging with normal direction of blood flow towards the liver. Small right pleural effusion noted. IMPRESSION: No evidence of cholecystitis or cholelithiasis. Small right effusion. Electronically Signed   By: Rolm Baptise M.D.   On: 02/24/2018 09:33     Microbiology: No results found for this or any previous visit (from the past 240 hour(s)).   Labs: Basic Metabolic Panel: No results for input(s): NA, K, CL, CO2, GLUCOSE, BUN, CREATININE, CALCIUM, MG, PHOS in the last 168 hours. Liver Function Tests: No results for input(s): AST, ALT, ALKPHOS, BILITOT, PROT, ALBUMIN in the last 168 hours. No results for input(s): LIPASE, AMYLASE in the last 168 hours. No results for input(s): AMMONIA in the last 168 hours. CBC: No results for input(s): WBC, NEUTROABS, HGB, HCT, MCV, PLT in the last 168 hours. Cardiac Enzymes: No results for input(s): CKTOTAL, CKMB, CKMBINDEX, TROPONINI in the last 168 hours. BNP: Invalid input(s): POCBNP CBG: No results for input(s): GLUCAP in the last 168 hours.  Time coordinating discharge:  36  minutes  Signed:  Orson Eva, DO [facility] Pager: [phone] 03/11/2018, 5:08 PM

## 2018-04-25 ENCOUNTER — Other Ambulatory Visit (HOSPITAL_COMMUNITY): Payer: Self-pay | Admitting: Internal Medicine

## 2018-04-25 ENCOUNTER — Ambulatory Visit (HOSPITAL_COMMUNITY)
Admission: RE | Admit: 2018-04-25 | Discharge: 2018-04-25 | Disposition: A | Discharge status: Home / Self Care | Payer: Medicare Other | Source: Ambulatory Visit | Attending: Internal Medicine | Admitting: Internal Medicine

## 2018-04-25 DIAGNOSIS — J8410 Pulmonary fibrosis, unspecified: Secondary | ICD-10-CM | POA: Insufficient documentation

## 2018-04-25 DIAGNOSIS — R0602 Shortness of breath: Secondary | ICD-10-CM

## 2018-04-25 DIAGNOSIS — Z1389 Encounter for screening for other disorder: Secondary | ICD-10-CM | POA: Diagnosis not present

## 2018-04-25 DIAGNOSIS — J9610 Chronic respiratory failure, unspecified whether with hypoxia or hypercapnia: Secondary | ICD-10-CM | POA: Diagnosis not present

## 2018-04-25 DIAGNOSIS — J449 Chronic obstructive pulmonary disease, unspecified: Secondary | ICD-10-CM | POA: Diagnosis not present

## 2018-04-25 DIAGNOSIS — G894 Chronic pain syndrome: Secondary | ICD-10-CM | POA: Diagnosis not present

## 2018-04-25 DIAGNOSIS — Z6824 Body mass index (BMI) 24.0-24.9, adult: Secondary | ICD-10-CM | POA: Diagnosis not present

## 2018-05-24 DIAGNOSIS — J8410 Pulmonary fibrosis, unspecified: Secondary | ICD-10-CM | POA: Diagnosis not present

## 2018-05-24 DIAGNOSIS — J449 Chronic obstructive pulmonary disease, unspecified: Secondary | ICD-10-CM | POA: Diagnosis not present

## 2018-05-24 DIAGNOSIS — I5032 Chronic diastolic (congestive) heart failure: Secondary | ICD-10-CM | POA: Diagnosis not present

## 2018-05-24 DIAGNOSIS — J9611 Chronic respiratory failure with hypoxia: Secondary | ICD-10-CM | POA: Diagnosis not present

## 2018-05-25 ENCOUNTER — Other Ambulatory Visit (HOSPITAL_COMMUNITY): Payer: Self-pay | Admitting: Pulmonary Disease

## 2018-05-25 DIAGNOSIS — J8410 Pulmonary fibrosis, unspecified: Secondary | ICD-10-CM

## 2018-06-08 ENCOUNTER — Ambulatory Visit (HOSPITAL_COMMUNITY)
Admission: RE | Admit: 2018-06-08 | Discharge: 2018-06-08 | Disposition: A | Discharge status: Home / Self Care | Payer: Medicare Other | Source: Ambulatory Visit | Attending: Pulmonary Disease | Admitting: Pulmonary Disease

## 2018-06-08 DIAGNOSIS — J8410 Pulmonary fibrosis, unspecified: Secondary | ICD-10-CM | POA: Insufficient documentation

## 2018-06-08 DIAGNOSIS — J849 Interstitial pulmonary disease, unspecified: Secondary | ICD-10-CM | POA: Diagnosis not present

## 2018-06-29 DIAGNOSIS — I5031 Acute diastolic (congestive) heart failure: Secondary | ICD-10-CM | POA: Diagnosis not present

## 2018-06-29 DIAGNOSIS — Z6826 Body mass index (BMI) 26.0-26.9, adult: Secondary | ICD-10-CM | POA: Diagnosis not present

## 2018-06-29 DIAGNOSIS — I700 Atherosclerosis of aorta: Secondary | ICD-10-CM | POA: Diagnosis not present

## 2018-06-29 DIAGNOSIS — E663 Overweight: Secondary | ICD-10-CM | POA: Diagnosis not present

## 2018-06-29 DIAGNOSIS — J449 Chronic obstructive pulmonary disease, unspecified: Secondary | ICD-10-CM | POA: Diagnosis not present

## 2018-06-29 DIAGNOSIS — E440 Moderate protein-calorie malnutrition: Secondary | ICD-10-CM | POA: Diagnosis not present

## 2018-06-29 DIAGNOSIS — Z1389 Encounter for screening for other disorder: Secondary | ICD-10-CM | POA: Diagnosis not present

## 2018-06-29 DIAGNOSIS — J8410 Pulmonary fibrosis, unspecified: Secondary | ICD-10-CM | POA: Diagnosis not present

## 2018-06-29 DIAGNOSIS — G894 Chronic pain syndrome: Secondary | ICD-10-CM | POA: Diagnosis not present

## 2018-06-29 DIAGNOSIS — F419 Anxiety disorder, unspecified: Secondary | ICD-10-CM | POA: Diagnosis not present

## 2018-07-10 DIAGNOSIS — J8410 Pulmonary fibrosis, unspecified: Secondary | ICD-10-CM | POA: Diagnosis not present

## 2018-07-10 DIAGNOSIS — J9611 Chronic respiratory failure with hypoxia: Secondary | ICD-10-CM | POA: Diagnosis not present

## 2018-07-10 DIAGNOSIS — J449 Chronic obstructive pulmonary disease, unspecified: Secondary | ICD-10-CM | POA: Diagnosis not present

## 2018-07-10 DIAGNOSIS — I5032 Chronic diastolic (congestive) heart failure: Secondary | ICD-10-CM | POA: Diagnosis not present

## 2018-08-02 ENCOUNTER — Other Ambulatory Visit: Payer: Self-pay | Admitting: Psychiatry

## 2018-08-02 ENCOUNTER — Other Ambulatory Visit: Payer: Self-pay | Admitting: Internal Medicine

## 2018-08-02 ENCOUNTER — Ambulatory Visit (HOSPITAL_COMMUNITY)
Admission: RE | Admit: 2018-08-02 | Discharge: 2018-08-02 | Disposition: A | Discharge status: Home / Self Care | Payer: Medicare Other | Source: Ambulatory Visit | Attending: Internal Medicine | Admitting: Internal Medicine

## 2018-08-02 DIAGNOSIS — R109 Unspecified abdominal pain: Secondary | ICD-10-CM | POA: Insufficient documentation

## 2018-08-02 DIAGNOSIS — I5031 Acute diastolic (congestive) heart failure: Secondary | ICD-10-CM | POA: Diagnosis not present

## 2018-08-02 DIAGNOSIS — Z6826 Body mass index (BMI) 26.0-26.9, adult: Secondary | ICD-10-CM | POA: Diagnosis not present

## 2018-08-02 DIAGNOSIS — J449 Chronic obstructive pulmonary disease, unspecified: Secondary | ICD-10-CM | POA: Diagnosis not present

## 2018-08-02 DIAGNOSIS — E440 Moderate protein-calorie malnutrition: Secondary | ICD-10-CM | POA: Diagnosis not present

## 2018-08-02 LAB — POCT I-STAT CREATININE: Creatinine, Ser: 1.2 mg/dL (ref 0.61–1.24)

## 2018-08-02 MED ORDER — IOHEXOL 300 MG/ML  SOLN
100.0000 mL | Freq: Once | INTRAMUSCULAR | Status: AC | PRN
  Administered 2018-08-02: 100 mL via INTRAVENOUS

## 2018-08-29 DIAGNOSIS — E663 Overweight: Secondary | ICD-10-CM | POA: Diagnosis not present

## 2018-08-29 DIAGNOSIS — I10 Essential (primary) hypertension: Secondary | ICD-10-CM | POA: Diagnosis not present

## 2018-08-29 DIAGNOSIS — Z6826 Body mass index (BMI) 26.0-26.9, adult: Secondary | ICD-10-CM | POA: Diagnosis not present

## 2018-08-29 DIAGNOSIS — E440 Moderate protein-calorie malnutrition: Secondary | ICD-10-CM | POA: Diagnosis not present

## 2018-08-29 DIAGNOSIS — G894 Chronic pain syndrome: Secondary | ICD-10-CM | POA: Diagnosis not present

## 2018-08-29 DIAGNOSIS — I5031 Acute diastolic (congestive) heart failure: Secondary | ICD-10-CM | POA: Diagnosis not present

## 2018-08-29 DIAGNOSIS — J449 Chronic obstructive pulmonary disease, unspecified: Secondary | ICD-10-CM | POA: Diagnosis not present

## 2018-08-29 DIAGNOSIS — J8410 Pulmonary fibrosis, unspecified: Secondary | ICD-10-CM | POA: Diagnosis not present

## 2018-09-28 DIAGNOSIS — G894 Chronic pain syndrome: Secondary | ICD-10-CM | POA: Diagnosis not present

## 2018-09-28 DIAGNOSIS — E663 Overweight: Secondary | ICD-10-CM | POA: Diagnosis not present

## 2018-09-28 DIAGNOSIS — Z6826 Body mass index (BMI) 26.0-26.9, adult: Secondary | ICD-10-CM | POA: Diagnosis not present

## 2018-09-28 DIAGNOSIS — E782 Mixed hyperlipidemia: Secondary | ICD-10-CM | POA: Diagnosis not present

## 2018-10-09 DIAGNOSIS — I501 Left ventricular failure: Secondary | ICD-10-CM | POA: Diagnosis not present

## 2018-10-09 DIAGNOSIS — J8410 Pulmonary fibrosis, unspecified: Secondary | ICD-10-CM | POA: Diagnosis not present

## 2018-10-19 DIAGNOSIS — E663 Overweight: Secondary | ICD-10-CM | POA: Diagnosis not present

## 2018-10-19 DIAGNOSIS — G894 Chronic pain syndrome: Secondary | ICD-10-CM | POA: Diagnosis not present

## 2018-10-19 DIAGNOSIS — M5030 Other cervical disc degeneration, unspecified cervical region: Secondary | ICD-10-CM | POA: Diagnosis not present

## 2018-10-19 DIAGNOSIS — Z6826 Body mass index (BMI) 26.0-26.9, adult: Secondary | ICD-10-CM | POA: Diagnosis not present

## 2018-11-28 DIAGNOSIS — G894 Chronic pain syndrome: Secondary | ICD-10-CM | POA: Diagnosis not present

## 2018-11-28 DIAGNOSIS — Z1389 Encounter for screening for other disorder: Secondary | ICD-10-CM | POA: Diagnosis not present

## 2018-11-28 DIAGNOSIS — M5030 Other cervical disc degeneration, unspecified cervical region: Secondary | ICD-10-CM | POA: Diagnosis not present

## 2018-11-28 DIAGNOSIS — Z6826 Body mass index (BMI) 26.0-26.9, adult: Secondary | ICD-10-CM | POA: Diagnosis not present

## 2018-11-28 DIAGNOSIS — Z0001 Encounter for general adult medical examination with abnormal findings: Secondary | ICD-10-CM | POA: Diagnosis not present

## 2018-12-06 DIAGNOSIS — J9611 Chronic respiratory failure with hypoxia: Secondary | ICD-10-CM | POA: Diagnosis not present

## 2018-12-06 DIAGNOSIS — J8410 Pulmonary fibrosis, unspecified: Secondary | ICD-10-CM | POA: Diagnosis not present

## 2018-12-06 DIAGNOSIS — J449 Chronic obstructive pulmonary disease, unspecified: Secondary | ICD-10-CM | POA: Diagnosis not present

## 2018-12-06 DIAGNOSIS — I5032 Chronic diastolic (congestive) heart failure: Secondary | ICD-10-CM | POA: Diagnosis not present

## 2018-12-28 DIAGNOSIS — Z6826 Body mass index (BMI) 26.0-26.9, adult: Secondary | ICD-10-CM | POA: Diagnosis not present

## 2018-12-28 DIAGNOSIS — E663 Overweight: Secondary | ICD-10-CM | POA: Diagnosis not present

## 2018-12-28 DIAGNOSIS — G894 Chronic pain syndrome: Secondary | ICD-10-CM | POA: Diagnosis not present

## 2019-01-29 DIAGNOSIS — E663 Overweight: Secondary | ICD-10-CM | POA: Diagnosis not present

## 2019-01-29 DIAGNOSIS — G894 Chronic pain syndrome: Secondary | ICD-10-CM | POA: Diagnosis not present

## 2019-01-29 DIAGNOSIS — Z6826 Body mass index (BMI) 26.0-26.9, adult: Secondary | ICD-10-CM | POA: Diagnosis not present

## 2019-02-27 DIAGNOSIS — G894 Chronic pain syndrome: Secondary | ICD-10-CM | POA: Diagnosis not present

## 2019-02-27 DIAGNOSIS — M109 Gout, unspecified: Secondary | ICD-10-CM | POA: Diagnosis not present

## 2019-02-27 DIAGNOSIS — E663 Overweight: Secondary | ICD-10-CM | POA: Diagnosis not present

## 2019-02-27 DIAGNOSIS — Z6826 Body mass index (BMI) 26.0-26.9, adult: Secondary | ICD-10-CM | POA: Diagnosis not present

## 2019-03-08 DIAGNOSIS — I5032 Chronic diastolic (congestive) heart failure: Secondary | ICD-10-CM | POA: Diagnosis not present

## 2019-03-08 DIAGNOSIS — J449 Chronic obstructive pulmonary disease, unspecified: Secondary | ICD-10-CM | POA: Diagnosis not present

## 2019-03-08 DIAGNOSIS — F17200 Nicotine dependence, unspecified, uncomplicated: Secondary | ICD-10-CM | POA: Diagnosis not present

## 2019-03-08 DIAGNOSIS — J8410 Pulmonary fibrosis, unspecified: Secondary | ICD-10-CM | POA: Diagnosis not present

## 2019-03-14 DIAGNOSIS — E782 Mixed hyperlipidemia: Secondary | ICD-10-CM | POA: Diagnosis not present

## 2019-03-14 DIAGNOSIS — I10 Essential (primary) hypertension: Secondary | ICD-10-CM | POA: Diagnosis not present

## 2019-03-14 DIAGNOSIS — J449 Chronic obstructive pulmonary disease, unspecified: Secondary | ICD-10-CM | POA: Diagnosis not present

## 2019-03-14 DIAGNOSIS — M1991 Primary osteoarthritis, unspecified site: Secondary | ICD-10-CM | POA: Diagnosis not present

## 2019-03-19 DIAGNOSIS — Z6826 Body mass index (BMI) 26.0-26.9, adult: Secondary | ICD-10-CM | POA: Diagnosis not present

## 2019-03-19 DIAGNOSIS — M5126 Other intervertebral disc displacement, lumbar region: Secondary | ICD-10-CM | POA: Diagnosis not present

## 2019-03-19 DIAGNOSIS — G894 Chronic pain syndrome: Secondary | ICD-10-CM | POA: Diagnosis not present

## 2019-03-19 DIAGNOSIS — E663 Overweight: Secondary | ICD-10-CM | POA: Diagnosis not present

## 2019-03-22 ENCOUNTER — Emergency Department (HOSPITAL_COMMUNITY): Payer: Medicare Other

## 2019-03-22 ENCOUNTER — Emergency Department (HOSPITAL_COMMUNITY)
Admission: EM | Admit: 2019-03-22 | Discharge: 2019-03-22 | Disposition: A | Discharge status: Home / Self Care | Payer: Medicare Other | Attending: Emergency Medicine | Admitting: Emergency Medicine

## 2019-03-22 ENCOUNTER — Encounter (HOSPITAL_COMMUNITY): Payer: Self-pay | Admitting: Emergency Medicine

## 2019-03-22 ENCOUNTER — Other Ambulatory Visit: Payer: Self-pay

## 2019-03-22 DIAGNOSIS — W5522XA Struck by cow, initial encounter: Secondary | ICD-10-CM | POA: Insufficient documentation

## 2019-03-22 DIAGNOSIS — Z79899 Other long term (current) drug therapy: Secondary | ICD-10-CM | POA: Insufficient documentation

## 2019-03-22 DIAGNOSIS — S2242XA Multiple fractures of ribs, left side, initial encounter for closed fracture: Secondary | ICD-10-CM

## 2019-03-22 DIAGNOSIS — Y9289 Other specified places as the place of occurrence of the external cause: Secondary | ICD-10-CM | POA: Diagnosis not present

## 2019-03-22 DIAGNOSIS — S3991XA Unspecified injury of abdomen, initial encounter: Secondary | ICD-10-CM | POA: Diagnosis not present

## 2019-03-22 DIAGNOSIS — R0902 Hypoxemia: Secondary | ICD-10-CM | POA: Diagnosis not present

## 2019-03-22 DIAGNOSIS — Y999 Unspecified external cause status: Secondary | ICD-10-CM | POA: Insufficient documentation

## 2019-03-22 DIAGNOSIS — F17210 Nicotine dependence, cigarettes, uncomplicated: Secondary | ICD-10-CM | POA: Insufficient documentation

## 2019-03-22 DIAGNOSIS — I5030 Unspecified diastolic (congestive) heart failure: Secondary | ICD-10-CM | POA: Insufficient documentation

## 2019-03-22 DIAGNOSIS — T07XXXA Unspecified multiple injuries, initial encounter: Secondary | ICD-10-CM | POA: Diagnosis not present

## 2019-03-22 DIAGNOSIS — S20212A Contusion of left front wall of thorax, initial encounter: Secondary | ICD-10-CM

## 2019-03-22 DIAGNOSIS — Y939 Activity, unspecified: Secondary | ICD-10-CM | POA: Diagnosis not present

## 2019-03-22 DIAGNOSIS — R0689 Other abnormalities of breathing: Secondary | ICD-10-CM | POA: Diagnosis not present

## 2019-03-22 DIAGNOSIS — R52 Pain, unspecified: Secondary | ICD-10-CM | POA: Diagnosis not present

## 2019-03-22 DIAGNOSIS — J449 Chronic obstructive pulmonary disease, unspecified: Secondary | ICD-10-CM | POA: Insufficient documentation

## 2019-03-22 DIAGNOSIS — S299XXA Unspecified injury of thorax, initial encounter: Secondary | ICD-10-CM | POA: Diagnosis present

## 2019-03-22 DIAGNOSIS — R1012 Left upper quadrant pain: Secondary | ICD-10-CM | POA: Diagnosis not present

## 2019-03-22 DIAGNOSIS — R079 Chest pain, unspecified: Secondary | ICD-10-CM | POA: Diagnosis not present

## 2019-03-22 DIAGNOSIS — J80 Acute respiratory distress syndrome: Secondary | ICD-10-CM | POA: Diagnosis not present

## 2019-03-22 LAB — COMPREHENSIVE METABOLIC PANEL
ALT: 16 U/L (ref 0–44)
AST: 16 U/L (ref 15–41)
Albumin: 3 g/dL — ABNORMAL LOW (ref 3.5–5.0)
Alkaline Phosphatase: 72 U/L (ref 38–126)
Anion gap: 9 (ref 5–15)
BUN: 11 mg/dL (ref 8–23)
CO2: 25 mmol/L (ref 22–32)
Calcium: 9 mg/dL (ref 8.9–10.3)
Chloride: 104 mmol/L (ref 98–111)
Creatinine, Ser: 1.07 mg/dL (ref 0.61–1.24)
GFR calc Af Amer: >60 mL/min (ref >60)
GFR calc non Af Amer: >60 mL/min (ref >60)
Glucose, Bld: 116 mg/dL — ABNORMAL HIGH (ref 70–99)
Potassium: 4.4 mmol/L (ref 3.5–5.1)
Sodium: 138 mmol/L (ref 135–145)
Total Bilirubin: 0.7 mg/dL (ref 0.3–1.2)
Total Protein: 6.7 g/dL (ref 6.5–8.1)

## 2019-03-22 LAB — CBC
HCT: 46.3 % (ref 39.0–52.0)
Hemoglobin: 15.5 g/dL (ref 13.0–17.0)
MCH: 32.7 pg (ref 26.0–34.0)
MCHC: 33.5 g/dL (ref 30.0–36.0)
MCV: 97.7 fL (ref 80.0–100.0)
Platelets: 317 10*3/uL (ref 150–400)
RBC: 4.74 MIL/uL (ref 4.22–5.81)
RDW: 12.9 % (ref 11.5–15.5)
WBC: 12.7 10*3/uL — ABNORMAL HIGH (ref 4.0–10.5)
nRBC: 0 % (ref 0.0–0.2)

## 2019-03-22 LAB — LACTIC ACID, PLASMA: Lactic Acid, Venous: 1.2 mmol/L (ref 0.5–1.9)

## 2019-03-22 MED ORDER — SODIUM CHLORIDE 0.9 % IV BOLUS
1000.0000 mL | Freq: Once | INTRAVENOUS | Status: AC
  Administered 2019-03-22: 1000 mL via INTRAVENOUS

## 2019-03-22 MED ORDER — IOHEXOL 300 MG/ML  SOLN
100.0000 mL | Freq: Once | INTRAMUSCULAR | Status: AC | PRN
  Administered 2019-03-22: 100 mL via INTRAVENOUS

## 2019-03-22 MED ORDER — FENTANYL CITRATE (PF) 100 MCG/2ML IJ SOLN
50.0000 ug | Freq: Once | INTRAMUSCULAR | Status: AC
  Administered 2019-03-22: 06:00:00 50 ug via INTRAVENOUS
  Filled 2019-03-22: qty 2

## 2019-03-22 MED ORDER — SODIUM CHLORIDE 0.9 % IV SOLN: INTRAVENOUS | Status: DC

## 2019-03-22 MED ORDER — FENTANYL CITRATE (PF) 100 MCG/2ML IJ SOLN
100.0000 ug | Freq: Once | INTRAMUSCULAR | Status: AC
  Administered 2019-03-22: 03:00:00 100 ug via INTRAVENOUS
  Filled 2019-03-22: qty 2

## 2019-03-22 MED ORDER — HYDROCODONE-ACETAMINOPHEN 5-325 MG PO TABS
1.0000 | ORAL_TABLET | Freq: Once | ORAL | Status: AC
  Administered 2019-03-22: 1 via ORAL
  Filled 2019-03-22: qty 1

## 2019-03-22 NOTE — ED Notes (Signed)
Discharge instructions discussed with pt. Pt verbalized understanding. Pt stable and ambulatory. No signature pad available. 

## 2019-03-22 NOTE — ED Provider Notes (Signed)
[facility] EMERGENCY DEPARTMENT Provider Note  CSN: [id number] Arrival date & time: 03/22/19 [id number]  Chief Complaint(s) Chest Injury  HPI Jordan May is a 63 y.o. male with a history listed below including COPD (he reports he supposed to be on oxygen but not currently on it), diastolic heart failure, pulmonary fibrosis who presents to the emergency department with left-sided chest wall pain after being kicked by a cow at 5 PM.  Pain is severe aching/stabbing pain.  Exacerbated with movement and palpation of the left lateral chest.  No alleviating factors.  Pain gradually worsened since onset.  Patient denies any other injuries from the incident.  HPI  Past Medical History Past Medical History:  Diagnosis Date   Arthritis    Chronic back pain    Emphysema/COPD ([place])    Hypercholesterolemia    Tobacco use    Patient Active Problem List   Diagnosis Date Noted   Acute diastolic CHF (congestive heart failure) ([place]) 02/23/2018   Acute on chronic respiratory failure with hypoxia ([facility])    Malnutrition of moderate degree 02/20/2018   Pulmonary fibrosis ([facility])    Hyperglycemia 02/19/2018   COPD with acute exacerbation ([place]) 02/19/2018   Elevated bilirubin 02/19/2018   Chronic back pain 02/19/2018   Hypercholesterolemia 02/19/2018   Tobacco use 02/19/2018   Hypomagnesemia 02/19/2018   Hypophosphatemia 02/19/2018   Sepsis due to undetermined organism ([place]) 02/19/2018   AKI (acute kidney injury) ([place]) 02/19/2018   Pleuritic chest pain 02/19/2018   Abnormal EKG 02/19/2018   Home Medication(s) Prior to Admission medications   Medication Sig Start Date End Date Taking? Authorizing Provider  diazepam (VALIUM) 10 MG tablet Take 10 mg by mouth every 6 (six) hours as needed. Muscle spasm 01/31/14   [provider]  gabapentin (NEURONTIN) 100 MG capsule Take 1 capsule by mouth 3 (three) times daily. 12/27/17   [provider]    HYDROcodone-acetaminophen (NORCO) 10-325 MG per tablet Take 1 tablet by mouth every 6 (six) hours as needed. pain 01/31/14   [provider]  ondansetron (ZOFRAN) 4 MG tablet Take 1 tablet (4 mg total) by mouth every 8 (eight) hours as needed for nausea or vomiting. 04/30/16   Clayton Bibles, PA-C  predniSONE (DELTASONE) 20 MG tablet Take 3 tablets (60 mg total) by mouth daily with breakfast. And decrease by one tablet daily 02/25/18   Tat, Shanon Brow, MD  simvastatin (ZOCOR) 10 MG tablet Take 10 mg by mouth at bedtime. 01/31/14   [provider]  triazolam (HALCION) 0.25 MG tablet Take 1 tablet by mouth at bedtime. 01/31/14   [provider]                                                                                                                                    Past Surgical History Past Surgical History:  Procedure Laterality Date   APPENDECTOMY     BACK  SURGERY     NECK SURGERY     Family History Family History  Problem Relation Age of Onset   Heart disease Mother        Pacemaker placement   Throat cancer Father    COPD Brother     Social History Social History   Tobacco Use   Smoking status: Current Every Day Smoker    Packs/day: 2.00    Types: Cigarettes   Smokeless tobacco: Never Used  Substance Use Topics   Alcohol use: No   Drug use: No   Allergies Oxycodone and Penicillins  Review of Systems Review of Systems All other systems are reviewed and are negative for acute change except as noted in the HPI  Physical Exam Vital Signs  I have reviewed the triage vital signs BP 124/66    Pulse 85    Temp 98.4 F (36.9 C) (Oral)    Resp 18    Ht 5\' 9"  (1.753 m)    Wt 83.9 kg    SpO2 94%    BMI 27.32 kg/m   Physical Exam Vitals signs reviewed.  Constitutional:      General: He is not in acute distress.    Appearance: He is well-developed. He is not diaphoretic.  HENT:     Head: Normocephalic and atraumatic.     Nose: Nose  normal.  Eyes:     General: No scleral icterus.       Right eye: No discharge.        Left eye: No discharge.     Conjunctiva/sclera: Conjunctivae normal.     Pupils: Pupils are equal, round, and reactive to light.  Neck:     Musculoskeletal: Normal range of motion and neck supple.  Cardiovascular:     Rate and Rhythm: Normal rate and regular rhythm.     Heart sounds: No murmur. No friction rub. No gallop.   Pulmonary:     Effort: Pulmonary effort is normal. No respiratory distress.     Breath sounds: Normal breath sounds. No stridor. No rales.  Chest:     Chest wall: Tenderness present.    Abdominal:     General: There is no distension.     Palpations: Abdomen is soft.     Tenderness: There is abdominal tenderness in the left upper quadrant. There is no guarding or rebound.  Musculoskeletal:        General: No tenderness.  Skin:    General: Skin is warm and dry.     Findings: No erythema or rash.  Neurological:     Mental Status: He is alert and oriented to person, place, and time.     ED Results and Treatments Labs (all labs ordered are listed, but only abnormal results are displayed) Labs Reviewed  COMPREHENSIVE METABOLIC PANEL - Abnormal; Notable for the following components:      Result Value   Glucose, Bld 116 (*)    Albumin 3.0 (*)    All other components within normal limits  CBC - Abnormal; Notable for the following components:   WBC 12.7 (*)    All other components within normal limits  LACTIC ACID, PLASMA  EKG  EKG Interpretation  Date/Time:  Thursday March 22 2019 03:22:20 EDT Ventricular Rate:  85 PR Interval:    QRS Duration: 85 QT Interval:  349 QTC Calculation: 415 R Axis:   72 Text Interpretation:  Sinus rhythm No significant change since last tracing Confirmed by Addison Lank [phone]) on 03/22/2019 3:49:34 AM       Radiology Ct Chest W Contrast  Result Date: 03/22/2019 CLINICAL DATA:  Posttraumatic chest pain EXAM: CT CHEST, ABDOMEN, AND PELVIS WITH CONTRAST TECHNIQUE: Multidetector CT imaging of the chest, abdomen and pelvis was performed following the standard protocol during bolus administration of intravenous contrast. CONTRAST:  17mL OMNIPAQUE IOHEXOL 300 MG/ML  SOLN COMPARISON:  Abdominal CT 08/02/2018.  Chest CT 06/08/2018 FINDINGS: CT CHEST FINDINGS Cardiovascular: Normal heart size. No pericardial effusion. Coronary atherosclerosis. Mediastinum/Nodes: Although new, benign and low-density cystic-appearing structure along the right cardiophrenic sulcus, likely a pericardial cyst that has filled. No hematoma or pneumomediastinum Lungs/Pleura: Chronic lung disease with honeycombing. There is also generalized airway thickening with multifocal airway collapse or mucoid impaction. Emphysema. Chronic left more than right pleural thickening with trace pleural fluid on the left. Musculoskeletal: Nondisplaced lateral left seventh and eighth rib fractures. CT ABDOMEN PELVIS FINDINGS Hepatobiliary: No hepatic injury or perihepatic hematoma. Gallbladder is unremarkable Pancreas: Negative Spleen: No splenic injury or perisplenic hematoma. Adrenals/Urinary Tract: No adrenal hemorrhage or renal injury identified. Bladder is unremarkable. Stomach/Bowel: No evidence of injury Vascular/Lymphatic: Moderate scattered atherosclerotic plaque. No evidence of vascular injury. Reproductive: Negative Other: No ascites or pneumoperitoneum.  No retroperitoneal hematoma Musculoskeletal: Small intramuscular lipoma versus posttraumatic fatty atrophy in the left thigh medial compartment. No acute fracture or subluxation IMPRESSION: 1. Nondisplaced left seventh and eighth rib fractures. No hemothorax or pneumothorax. 2. No evidence of intra-abdominal injury. 3. Chronic lung disease with emphysema and fibrosis, reference 2019 chest CT.  Electronically Signed   By: Monte Fantasia M.D.   On: 03/22/2019 05:08   Ct Abdomen Pelvis W Contrast  Result Date: 03/22/2019 CLINICAL DATA:  Posttraumatic chest pain EXAM: CT CHEST, ABDOMEN, AND PELVIS WITH CONTRAST TECHNIQUE: Multidetector CT imaging of the chest, abdomen and pelvis was performed following the standard protocol during bolus administration of intravenous contrast. CONTRAST:  136mL OMNIPAQUE IOHEXOL 300 MG/ML  SOLN COMPARISON:  Abdominal CT 08/02/2018.  Chest CT 06/08/2018 FINDINGS: CT CHEST FINDINGS Cardiovascular: Normal heart size. No pericardial effusion. Coronary atherosclerosis. Mediastinum/Nodes: Although new, benign and low-density cystic-appearing structure along the right cardiophrenic sulcus, likely a pericardial cyst that has filled. No hematoma or pneumomediastinum Lungs/Pleura: Chronic lung disease with honeycombing. There is also generalized airway thickening with multifocal airway collapse or mucoid impaction. Emphysema. Chronic left more than right pleural thickening with trace pleural fluid on the left. Musculoskeletal: Nondisplaced lateral left seventh and eighth rib fractures. CT ABDOMEN PELVIS FINDINGS Hepatobiliary: No hepatic injury or perihepatic hematoma. Gallbladder is unremarkable Pancreas: Negative Spleen: No splenic injury or perisplenic hematoma. Adrenals/Urinary Tract: No adrenal hemorrhage or renal injury identified. Bladder is unremarkable. Stomach/Bowel: No evidence of injury Vascular/Lymphatic: Moderate scattered atherosclerotic plaque. No evidence of vascular injury. Reproductive: Negative Other: No ascites or pneumoperitoneum.  No retroperitoneal hematoma Musculoskeletal: Small intramuscular lipoma versus posttraumatic fatty atrophy in the left thigh medial compartment. No acute fracture or subluxation IMPRESSION: 1. Nondisplaced left seventh and eighth rib fractures. No hemothorax or pneumothorax. 2. No evidence of intra-abdominal injury. 3. Chronic lung  disease with emphysema and fibrosis, reference 2019 chest CT. Electronically Signed   By: Neva Seat.D.  On: 03/22/2019 05:08    Pertinent labs & imaging results that were available during my care of the patient were reviewed by me and considered in my medical decision making (see chart for details).  Medications Ordered in ED Medications  sodium chloride 0.9 % bolus 1,000 mL (0 mLs Intravenous Stopped 03/22/19 0529)    And  0.9 %  sodium chloride infusion ( Intravenous Hold 03/22/19 0613)  fentaNYL (SUBLIMAZE) injection 100 mcg (100 mcg Intravenous Given 03/22/19 0323)  iohexol (OMNIPAQUE) 300 MG/ML solution 100 mL (100 mLs Intravenous Contrast Given 03/22/19 0431)  fentaNYL (SUBLIMAZE) injection 50 mcg (50 mcg Intravenous Given 03/22/19 0538)  HYDROcodone-acetaminophen (NORCO/VICODIN) 5-325 MG per tablet 1 tablet (1 tablet Oral Given 03/22/19 0538)                                                                                                                                    Procedures Procedures  (including critical care time)  Medical Decision Making / ED Course I have reviewed the nursing notes for this encounter and the patient's prior records (if available in EHR or on provided paperwork).   DEVRY SEGAR was evaluated in Emergency Department on 03/22/2019 for the symptoms described in the history of present illness. He was evaluated in the context of the global COVID-19 pandemic, which necessitated consideration that the patient might be at risk for infection with the SARS-CoV-2 virus that causes COVID-19. Institutional protocols and algorithms that pertain to the evaluation of patients at risk for COVID-19 are in a state of rapid change based on information released by regulatory bodies including the CDC and federal and state organizations. These policies and algorithms were followed during the patient's care in the ED.  Blunt trauma to the chest. Tenderness to palpation to the  left chest and left upper quadrant. No other injuries noted on exam requiring work-up. CT chest and abdomen obtained revealing left sixth and seventh rib fracture.  Chronic stable lung findings.  No additional injuries noted. Labs grossly reassuring. Provided with IV pain medicine. Patient satting between 88 and 92% on room air.  Appropriate for his COPD. Provided with incentive spirometer.  Patient already has active prescription for hydrocodone/acetaminophen 10/325.   The patient appears reasonably screened and/or stabilized for discharge and I doubt any other medical condition or other [facility] requiring further screening, evaluation, or treatment in the ED at this time prior to discharge.  The patient is safe for discharge with strict return precautions.     Final Clinical Impression(s) / ED Diagnoses Final diagnoses:  Closed fracture of multiple ribs of left side, initial encounter  Contusion of left chest wall, initial encounter    The patient appears reasonably screened and/or stabilized for discharge and I doubt any other medical condition or other [facility] requiring further screening, evaluation, or treatment in the ED at this time prior to discharge.  Disposition: Discharge  Condition: Good  I  have discussed the results, Dx and Tx plan with the patient who expressed understanding and agree(s) with the plan. Discharge instructions discussed at great length. The patient was given strict return precautions who verbalized understanding of the instructions. No further questions at time of discharge.    ED Discharge Orders    None        Follow Up: [employer], [place] [id number] [phone]  Schedule an appointment as soon as possible for a visit  As needed      This chart was dictated using voice recognition software.  Despite best efforts to proofread,  errors can occur which can change the documentation meaning.   Fatima Blank, MD 03/22/19 [phone]

## 2019-03-22 NOTE — ED Triage Notes (Signed)
Pt presents to ED from home BIB [facility]. Pt c/o L CP starting yesterday after being kick by cow. Pt reports pain is 10/10. EMS gave 100 mcg fent. Per EMS pt 89% O2 on RA. Pt hx COPD and pulmonary fibrosis. Noncompliant w/ home O2. EMS VSS w/ pt on 4L [place].

## 2019-03-22 NOTE — ED Notes (Signed)
MD aware and ok w/ pt O2 sat sitting around 88-91% d/t pt COPD hx

## 2019-03-22 NOTE — ED Notes (Signed)
Pt practicing w/ incentive spirometer. Pt achieved between 250-750 x10.

## 2019-03-22 NOTE — ED Notes (Signed)
Pt refused gown.  

## 2019-03-23 ENCOUNTER — Other Ambulatory Visit: Payer: Self-pay | Admitting: Physician Assistant

## 2019-03-23 ENCOUNTER — Other Ambulatory Visit (HOSPITAL_COMMUNITY): Payer: Self-pay | Admitting: Physician Assistant

## 2019-03-23 DIAGNOSIS — M5126 Other intervertebral disc displacement, lumbar region: Secondary | ICD-10-CM

## 2019-04-17 ENCOUNTER — Other Ambulatory Visit: Payer: Self-pay

## 2019-04-17 ENCOUNTER — Ambulatory Visit (HOSPITAL_COMMUNITY)
Admission: RE | Admit: 2019-04-17 | Discharge: 2019-04-17 | Disposition: A | Discharge status: Home / Self Care | Payer: Medicare Other | Source: Ambulatory Visit | Attending: Physician Assistant | Admitting: Physician Assistant

## 2019-04-17 DIAGNOSIS — M48061 Spinal stenosis, lumbar region without neurogenic claudication: Secondary | ICD-10-CM | POA: Diagnosis not present

## 2019-04-17 DIAGNOSIS — M5126 Other intervertebral disc displacement, lumbar region: Secondary | ICD-10-CM | POA: Insufficient documentation

## 2019-04-27 DIAGNOSIS — T819XXA Unspecified complication of procedure, initial encounter: Secondary | ICD-10-CM | POA: Insufficient documentation

## 2019-04-27 DIAGNOSIS — M109 Gout, unspecified: Secondary | ICD-10-CM | POA: Insufficient documentation

## 2019-04-27 DIAGNOSIS — M961 Postlaminectomy syndrome, not elsewhere classified: Secondary | ICD-10-CM | POA: Diagnosis not present

## 2019-04-27 DIAGNOSIS — S2249XA Multiple fractures of ribs, unspecified side, initial encounter for closed fracture: Secondary | ICD-10-CM | POA: Insufficient documentation

## 2019-04-27 DIAGNOSIS — J449 Chronic obstructive pulmonary disease, unspecified: Secondary | ICD-10-CM | POA: Diagnosis not present

## 2019-04-27 DIAGNOSIS — M545 Low back pain: Secondary | ICD-10-CM | POA: Diagnosis not present

## 2019-04-27 DIAGNOSIS — M5416 Radiculopathy, lumbar region: Secondary | ICD-10-CM | POA: Diagnosis not present

## 2019-04-27 DIAGNOSIS — M5136 Other intervertebral disc degeneration, lumbar region: Secondary | ICD-10-CM | POA: Insufficient documentation

## 2019-05-01 DIAGNOSIS — M5136 Other intervertebral disc degeneration, lumbar region: Secondary | ICD-10-CM | POA: Diagnosis not present

## 2019-05-07 DIAGNOSIS — Z0001 Encounter for general adult medical examination with abnormal findings: Secondary | ICD-10-CM | POA: Diagnosis not present

## 2019-05-07 DIAGNOSIS — Z0000 Encounter for general adult medical examination without abnormal findings: Secondary | ICD-10-CM | POA: Diagnosis not present

## 2019-05-07 DIAGNOSIS — E782 Mixed hyperlipidemia: Secondary | ICD-10-CM | POA: Diagnosis not present

## 2019-05-07 DIAGNOSIS — Z1389 Encounter for screening for other disorder: Secondary | ICD-10-CM | POA: Diagnosis not present

## 2019-05-07 DIAGNOSIS — Z6826 Body mass index (BMI) 26.0-26.9, adult: Secondary | ICD-10-CM | POA: Diagnosis not present

## 2019-05-09 DIAGNOSIS — F17200 Nicotine dependence, unspecified, uncomplicated: Secondary | ICD-10-CM | POA: Diagnosis not present

## 2019-05-09 DIAGNOSIS — M545 Low back pain: Secondary | ICD-10-CM | POA: Diagnosis not present

## 2019-05-09 DIAGNOSIS — J8410 Pulmonary fibrosis, unspecified: Secondary | ICD-10-CM | POA: Diagnosis not present

## 2019-05-09 DIAGNOSIS — J449 Chronic obstructive pulmonary disease, unspecified: Secondary | ICD-10-CM | POA: Diagnosis not present

## 2019-05-22 DIAGNOSIS — M5416 Radiculopathy, lumbar region: Secondary | ICD-10-CM | POA: Diagnosis not present

## 2019-05-22 DIAGNOSIS — M961 Postlaminectomy syndrome, not elsewhere classified: Secondary | ICD-10-CM | POA: Diagnosis not present

## 2019-05-22 DIAGNOSIS — M5136 Other intervertebral disc degeneration, lumbar region: Secondary | ICD-10-CM | POA: Diagnosis not present

## 2019-05-22 DIAGNOSIS — M545 Low back pain: Secondary | ICD-10-CM | POA: Diagnosis not present

## 2019-05-29 DIAGNOSIS — Z6825 Body mass index (BMI) 25.0-25.9, adult: Secondary | ICD-10-CM | POA: Diagnosis not present

## 2019-05-29 DIAGNOSIS — G894 Chronic pain syndrome: Secondary | ICD-10-CM | POA: Diagnosis not present

## 2019-05-29 DIAGNOSIS — E663 Overweight: Secondary | ICD-10-CM | POA: Diagnosis not present

## 2019-06-14 DIAGNOSIS — E7849 Other hyperlipidemia: Secondary | ICD-10-CM | POA: Diagnosis not present

## 2019-06-14 DIAGNOSIS — G894 Chronic pain syndrome: Secondary | ICD-10-CM | POA: Diagnosis not present

## 2019-06-14 DIAGNOSIS — F4542 Pain disorder with related psychological factors: Secondary | ICD-10-CM | POA: Diagnosis not present

## 2019-06-20 ENCOUNTER — Encounter (HOSPITAL_COMMUNITY): Payer: Self-pay | Admitting: Surgery

## 2019-06-20 ENCOUNTER — Encounter (HOSPITAL_COMMUNITY): Payer: Self-pay | Admitting: Hematology

## 2019-06-20 ENCOUNTER — Other Ambulatory Visit: Payer: Self-pay

## 2019-06-20 ENCOUNTER — Inpatient Hospital Stay (HOSPITAL_COMMUNITY): Payer: Medicare Other

## 2019-06-20 ENCOUNTER — Inpatient Hospital Stay (HOSPITAL_COMMUNITY): Payer: Medicare Other | Attending: Hematology | Admitting: Hematology

## 2019-06-20 VITALS — BP 117/75 | HR 98 | Temp 97.3°F | Resp 20 | Ht 68.0 in | Wt 171.4 lb

## 2019-06-20 DIAGNOSIS — K5900 Constipation, unspecified: Secondary | ICD-10-CM

## 2019-06-20 DIAGNOSIS — Z880 Allergy status to penicillin: Secondary | ICD-10-CM | POA: Diagnosis not present

## 2019-06-20 DIAGNOSIS — J449 Chronic obstructive pulmonary disease, unspecified: Secondary | ICD-10-CM

## 2019-06-20 DIAGNOSIS — Z836 Family history of other diseases of the respiratory system: Secondary | ICD-10-CM | POA: Insufficient documentation

## 2019-06-20 DIAGNOSIS — R5383 Other fatigue: Secondary | ICD-10-CM | POA: Insufficient documentation

## 2019-06-20 DIAGNOSIS — F17210 Nicotine dependence, cigarettes, uncomplicated: Secondary | ICD-10-CM | POA: Diagnosis not present

## 2019-06-20 DIAGNOSIS — Z885 Allergy status to narcotic agent status: Secondary | ICD-10-CM | POA: Insufficient documentation

## 2019-06-20 DIAGNOSIS — Z808 Family history of malignant neoplasm of other organs or systems: Secondary | ICD-10-CM | POA: Insufficient documentation

## 2019-06-20 DIAGNOSIS — D72829 Elevated white blood cell count, unspecified: Secondary | ICD-10-CM

## 2019-06-20 DIAGNOSIS — Z79899 Other long term (current) drug therapy: Secondary | ICD-10-CM | POA: Diagnosis not present

## 2019-06-20 DIAGNOSIS — J84112 Idiopathic pulmonary fibrosis: Secondary | ICD-10-CM

## 2019-06-20 DIAGNOSIS — R05 Cough: Secondary | ICD-10-CM | POA: Insufficient documentation

## 2019-06-20 DIAGNOSIS — Z841 Family history of disorders of kidney and ureter: Secondary | ICD-10-CM | POA: Diagnosis not present

## 2019-06-20 DIAGNOSIS — R269 Unspecified abnormalities of gait and mobility: Secondary | ICD-10-CM | POA: Diagnosis not present

## 2019-06-20 DIAGNOSIS — Z8249 Family history of ischemic heart disease and other diseases of the circulatory system: Secondary | ICD-10-CM

## 2019-06-20 DIAGNOSIS — M2550 Pain in unspecified joint: Secondary | ICD-10-CM

## 2019-06-20 DIAGNOSIS — M549 Dorsalgia, unspecified: Secondary | ICD-10-CM | POA: Diagnosis not present

## 2019-06-20 DIAGNOSIS — R531 Weakness: Secondary | ICD-10-CM | POA: Diagnosis not present

## 2019-06-20 LAB — CBC WITH DIFFERENTIAL/PLATELET
Abs Immature Granulocytes: 0.06 10*3/uL (ref 0.00–0.07)
Basophils Absolute: 0.1 10*3/uL (ref 0.0–0.1)
Basophils Relative: 1 %
Eosinophils Absolute: 0.2 10*3/uL (ref 0.0–0.5)
Eosinophils Relative: 1 %
HCT: 45.9 % (ref 39.0–52.0)
Hemoglobin: 15.3 g/dL (ref 13.0–17.0)
Immature Granulocytes: 0 %
Lymphocytes Relative: 37 %
Lymphs Abs: 5.2 10*3/uL — ABNORMAL HIGH (ref 0.7–4.0)
MCH: 31.4 pg (ref 26.0–34.0)
MCHC: 33.3 g/dL (ref 30.0–36.0)
MCV: 94.1 fL (ref 80.0–100.0)
Monocytes Absolute: 1.4 10*3/uL — ABNORMAL HIGH (ref 0.1–1.0)
Monocytes Relative: 10 %
Neutro Abs: 7 10*3/uL (ref 1.7–7.7)
Neutrophils Relative %: 51 %
Platelets: 347 10*3/uL (ref 150–400)
RBC: 4.88 MIL/uL (ref 4.22–5.81)
RDW: 14.2 % (ref 11.5–15.5)
WBC: 14 10*3/uL — ABNORMAL HIGH (ref 4.0–10.5)
nRBC: 0 % (ref 0.0–0.2)

## 2019-06-20 LAB — COMPREHENSIVE METABOLIC PANEL
ALT: 12 U/L (ref 0–44)
AST: 12 U/L — ABNORMAL LOW (ref 15–41)
Albumin: 3.4 g/dL — ABNORMAL LOW (ref 3.5–5.0)
Alkaline Phosphatase: 73 U/L (ref 38–126)
Anion gap: 9 (ref 5–15)
BUN: 15 mg/dL (ref 8–23)
CO2: 24 mmol/L (ref 22–32)
Calcium: 8.9 mg/dL (ref 8.9–10.3)
Chloride: 100 mmol/L (ref 98–111)
Creatinine, Ser: 1.02 mg/dL (ref 0.61–1.24)
GFR calc Af Amer: >60 mL/min (ref >60)
GFR calc non Af Amer: >60 mL/min (ref >60)
Glucose, Bld: 89 mg/dL (ref 70–99)
Potassium: 3.4 mmol/L — ABNORMAL LOW (ref 3.5–5.1)
Sodium: 133 mmol/L — ABNORMAL LOW (ref 135–145)
Total Bilirubin: 0.6 mg/dL (ref 0.3–1.2)
Total Protein: 6.9 g/dL (ref 6.5–8.1)

## 2019-06-20 LAB — LACTATE DEHYDROGENASE: LDH: 127 U/L (ref 98–192)

## 2019-06-20 NOTE — Progress Notes (Signed)
[facility] Initial Visit:  Patient Care Team: Redmond School, MD as PCP - General (Internal Medicine)  CHIEF COMPLAINTS/PURPOSE OF CONSULTATION: -  Leukocytosis     HISTORY OF PRESENTING ILLNESS: Jordan May 64 y.o. male presents today for consult regarding elevated WBC count.  He has a past medical history significant for emphysema, COPD, pulmonary fibrosis, arthritis, and tobacco use.  He was referred by his primary care provider for a notable elevated white blood cell count at 12.4 differential significant for elevated lymphocytes 4.5 and elevated monocytes at 1.4.  In review of his medical record, neutrophilia can be seen back in 2017.  Patient at times has been put on steroids for his lung conditions.  He denies any current prednisone use.  Recent CT of chest abdomen pelvis in October 2020 suggests chronic lung disease with honeycombing.  Generalized airway thickening with multifocal airway collapse or possible mucoid impaction.  Emphysema.  Pleural thickening with trace pleural fluid.  He is a current everyday smoker.  He states he started smoking at the age of 22.  For approximately 220 to 30 years he smoked up to 2 packs of cigarettes per day.  He currently smokes a pack a day.  He developed significant back pain in October.  MRI of spine concluded spondylosis most notable at L4-L5 with a central disc protrusion which contacts the bilateral descending L5 nerve roots without impingement.  He is currently following with orthopedics.  He reports significant fatigue over the past year.  He reports diminished appetite.  No weight loss.  Reports constipation.  Denies any B symptoms.  Denies any recent or recurrent infections.  Reports chronic cough.  Denies hemoptysis.  He has a family history significant for throat cancer in his father, and unknown cancers in multiple parental and maternal aunts and uncles.  Review of Systems  Constitutional: Positive for fatigue.   HENT:  Negative.   Respiratory: Positive for cough.   Cardiovascular: Negative.   Gastrointestinal: Positive for constipation.  Endocrine: Negative.   Genitourinary: Negative.    Musculoskeletal: Positive for arthralgias and back pain.  Skin: Negative.   Neurological: Positive for extremity weakness.  Hematological: Negative.   Psychiatric/Behavioral: Negative.     MEDICAL HISTORY: Past Medical History:  Diagnosis Date  . Arthritis   . Chronic back pain   . Emphysema/COPD ([place])   . Hypercholesterolemia   . IPF (idiopathic pulmonary fibrosis) ([place])   . Tobacco use     SURGICAL HISTORY: Past Surgical History:  Procedure Laterality Date  . APPENDECTOMY    . BACK SURGERY    . NECK SURGERY      SOCIAL HISTORY: Social History   Socioeconomic History  . Marital status: Married    Spouse name: Ozella Almond  . Number of children: 1  . Years of education: Not on file  . Highest education level: Not on file  Occupational History  . Occupation: disabled  Tobacco Use  . Smoking status: Current Every Day Smoker    Packs/day: 1.00    Years: 50.00    Pack years: 50.00    Types: Cigarettes  . Smokeless tobacco: Never Used  Substance and Sexual Activity  . Alcohol use: No  . Drug use: No  . Sexual activity: Yes  Other Topics Concern  . Not on file  Social History Narrative  . Not on file   Social Determinants of Health   Financial Resource Strain:   . Difficulty of Paying Living Expenses: Not on  file  Food Insecurity:   . Worried About Charity fundraiser in the Last Year: Not on file  . Ran Out of Food in the Last Year: Not on file  Transportation Needs:   . Lack of Transportation (Medical): Not on file  . Lack of Transportation (Non-Medical): Not on file  Physical Activity:   . Days of Exercise per Week: Not on file  . Minutes of Exercise per Session: Not on file  Stress:   . Feeling of Stress : Not on file  Social Connections:   . Frequency of Communication  with Friends and Family: Not on file  . Frequency of Social Gatherings with Friends and Family: Not on file  . Attends Religious Services: Not on file  . Active Member of Clubs or Organizations: Not on file  . Attends Archivist Meetings: Not on file  . Marital Status: Not on file  Intimate Partner Violence:   . Fear of Current or Ex-Partner: Not on file  . Emotionally Abused: Not on file  . Physically Abused: Not on file  . Sexually Abused: Not on file    FAMILY HISTORY Family History  Problem Relation Age of Onset  . Heart disease Mother        Pacemaker placement  . Throat cancer Father   . COPD Brother   . Kidney disease Brother     ALLERGIES:  is allergic to oxycodone; penicillamine; and penicillins.  MEDICATIONS:  Current Outpatient Medications  Medication Sig Dispense Refill  . COLCRYS 0.6 MG tablet Take 0.6 mg by mouth 2 (two) times daily.    Marland Kitchen gabapentin (NEURONTIN) 100 MG capsule Take 1 capsule by mouth daily.     Marland Kitchen HYDROcodone-acetaminophen (NORCO) 10-325 MG per tablet Take 1 tablet by mouth every 6 (six) hours as needed. pain    . nabumetone (RELAFEN) 750 MG tablet Take 750 mg by mouth daily.     . ondansetron (ZOFRAN) 4 MG tablet Take 1 tablet (4 mg total) by mouth every 8 (eight) hours as needed for nausea or vomiting. (Patient not taking: Reported on 06/20/2019) 15 tablet 0  . predniSONE (DELTASONE) 20 MG tablet Take 3 tablets (60 mg total) by mouth daily with breakfast. And decrease by one tablet daily (Patient taking differently: Take 10 mg by mouth daily with breakfast. And decrease by one tablet daily) 21 tablet 0  . sildenafil (REVATIO) 20 MG tablet Take 20 mg by mouth daily.     . simvastatin (ZOCOR) 10 MG tablet Take 10 mg by mouth at bedtime.     No current facility-administered medications for this visit.    PHYSICAL EXAMINATION:  ECOG PERFORMANCE STATUS: 1 - Symptomatic but completely ambulatory   Vitals:   06/20/19 1435  BP: 117/75   Pulse: 98  Resp: 20  Temp: (!) 97.3 F (36.3 C)  SpO2: 96%    Filed Weights   06/20/19 1435  Weight: 171 lb 6.4 oz (77.7 kg)     Physical Exam Constitutional:      Appearance: Normal appearance.  HENT:     Head: Normocephalic.     Right Ear: External ear normal.     Left Ear: External ear normal.     Nose: Nose normal.  Eyes:     Conjunctiva/sclera: Conjunctivae normal.  Cardiovascular:     Rate and Rhythm: Normal rate and regular rhythm.     Pulses: Normal pulses.     Heart sounds: Normal heart sounds.  Pulmonary:  Effort: Pulmonary effort is normal.     Comments: Diminished breath sounds left greater than right Abdominal:     General: Bowel sounds are normal.  Musculoskeletal:        General: Signs of injury present.     Cervical back: Normal range of motion.     Comments: Decreased range of motion  Neurological:     General: No focal deficit present.     Mental Status: He is alert and oriented to person, place, and time.     Motor: Weakness present.     Gait: Gait abnormal.  Psychiatric:        Mood and Affect: Mood normal.        Behavior: Behavior normal.      LABORATORY DATA: I have personally reviewed the data as listed:  Appointment on 06/20/2019  Component Date Value Ref Range Status  . WBC 06/20/2019 14.0* 4.0 - 10.5 K/uL Final  . RBC 06/20/2019 4.88  4.22 - 5.81 MIL/uL Final  . Hemoglobin 06/20/2019 15.3  13.0 - 17.0 g/dL Final  . HCT 06/20/2019 45.9  39.0 - 52.0 % Final  . MCV 06/20/2019 94.1  80.0 - 100.0 fL Final  . MCH 06/20/2019 31.4  26.0 - 34.0 pg Final  . MCHC 06/20/2019 33.3  30.0 - 36.0 g/dL Final  . RDW 06/20/2019 14.2  11.5 - 15.5 % Final  . Platelets 06/20/2019 347  150 - 400 K/uL Final  . nRBC 06/20/2019 0.0  0.0 - 0.2 % Final  . Neutrophils Relative % 06/20/2019 51  % Final  . Neutro Abs 06/20/2019 7.0  1.7 - 7.7 K/uL Final  . Lymphocytes Relative 06/20/2019 37  % Final  . Lymphs Abs 06/20/2019 5.2* 0.7 - 4.0 K/uL Final   . Monocytes Relative 06/20/2019 10  % Final  . Monocytes Absolute 06/20/2019 1.4* 0.1 - 1.0 K/uL Final  . Eosinophils Relative 06/20/2019 1  % Final  . Eosinophils Absolute 06/20/2019 0.2  0.0 - 0.5 K/uL Final  . Basophils Relative 06/20/2019 1  % Final  . Basophils Absolute 06/20/2019 0.1  0.0 - 0.1 K/uL Final  . Immature Granulocytes 06/20/2019 0  % Final  . Abs Immature Granulocytes 06/20/2019 0.06  0.00 - 0.07 K/uL Final   Performed at [facility], [address], [place], [place] [address]  . Sodium 06/20/2019 133* 135 - 145 mmol/L Final  . Potassium 06/20/2019 3.4* 3.5 - 5.1 mmol/L Final  . Chloride 06/20/2019 100  98 - 111 mmol/L Final  . CO2 06/20/2019 24  22 - 32 mmol/L Final  . Glucose, Bld 06/20/2019 89  70 - 99 mg/dL Final  . BUN 06/20/2019 15  8 - 23 mg/dL Final  . Creatinine, Ser 06/20/2019 1.02  0.61 - 1.24 mg/dL Final  . Calcium 06/20/2019 8.9  8.9 - 10.3 mg/dL Final  . Total Protein 06/20/2019 6.9  6.5 - 8.1 g/dL Final  . Albumin 06/20/2019 3.4* 3.5 - 5.0 g/dL Final  . AST 06/20/2019 12* 15 - 41 U/L Final  . ALT 06/20/2019 12  0 - 44 U/L Final  . Alkaline Phosphatase 06/20/2019 73  38 - 126 U/L Final  . Total Bilirubin 06/20/2019 0.6  0.3 - 1.2 mg/dL Final  . GFR calc non Af Amer 06/20/2019 >60  >60 mL/min Final  . GFR calc Af Amer 06/20/2019 >60  >60 mL/min Final  . Anion gap 06/20/2019 9  5 - 15 Final   Performed at [facility], [place]  [address] [place], [place] [address]  . LDH 06/20/2019 127  98 - 192 U/L Final   Performed at [facility], [address], [place], [place] [address]     ASSESSMENT/PLAN   Leukocytosis 1. Leukocytosis -Observed in labs from November 2017 with a white blood cell count of 12.4, neutrophil count of 8.2. -Most recent labs from 05/29/2019 suggests white blood cell count of 12.6, neutrophils normal at 6.4, lymphocytes elevated at 4.5, monocytes elevated at 1.4. -We discussed differential diagnosis of elevated white blood cell count  including but not limited to infectious process, medication, inflammation, autoimmune disorders, smoking, asplenia, and/or myeloproliferative disorder.  Patient has several contributing factors to elevated white blood cell count including chronic inflammation secondary to pulmonary fibrosis and smoking.  He has no B symptoms.  We will need to complete lab work-up to rule out any underlying etiology. -He will have labs done today followed by phone visit in 1 week.  2.  Lung disease -History of COPD, emphysema, pulmonary fibrosis.  He is followed by pulmonology.   - CT of chest abdomen pelvis in October 2020 suggests chronic lung disease with honeycombing.  Generalized airway thickening with multifocal airway collapse or possible mucoid impaction.  Emphysema.  Pleural thickening with trace pleural fluid.   -He continues to smoke 1/2 to 1 pack of cigarettes per day.  Patient was counseled on the harmful effects of smoking to his health and was advised to stop.  Cessation methods are available.  Addendum: -I have independently examined this patient and elicited history.  He was evaluated for elevated CBC, differential showing lymphocytosis.  Patient reports to me that he has been on prednisone 10 mg for the last 8 months started by Dr. Luan Pulling.  This was for his COPD.  He was reportedly told to be on oxygen but he has not been using it.  He also has not been using his inhalers as prescribed.  He does not have any B symptoms.  No palpable splenomegaly or lymphadenopathy.  As the most recent CBC showed elevated lymphocyte count, I have recommended doing a flow cytometry along with LDH and a smear review.  We will also check ANA and rheumatoid factor.  He will be seen back for follow-up in 1-2 visits as a virtual visit.   Orders Placed This Encounter  Procedures  . CBC with Differential    Standing Status:   Future    Number of Occurrences:   1    Standing Expiration Date:   06/19/2020  . Comprehensive  metabolic panel    Standing Status:   Future    Number of Occurrences:   1    Standing Expiration Date:   06/19/2020  . Pathologist smear review    Standing Status:   Future    Number of Occurrences:   1    Standing Expiration Date:   06/19/2020  . Lactate dehydrogenase    Standing Status:   Future    Number of Occurrences:   1    Standing Expiration Date:   06/19/2020  . ANA, IFA (with reflex)  . Rheumatoid factor    Standing Status:   Future    Number of Occurrences:   1    Standing Expiration Date:   06/19/2020  . CD19 and CD20, Flow Cytometry    All questions were answered. The patient knows to call the clinic with any problems, questions or concerns.  This note was electronically signed.    Derek Jack, MD  06/20/2019  5:36 PM

## 2019-06-20 NOTE — Patient Instructions (Signed)
[place] at [facility]  Discharge Instructions:  You saw Harriet Pho, NP, and Dr. Delton Coombes today. _______________________________________________________________  Thank you for choosing [place] at [facility] to provide your oncology and hematology care.  To afford each patient quality time with our providers, please arrive at least 15 minutes before your scheduled appointment.  You need to re-schedule your appointment if you arrive 10 or more minutes late.  We strive to give you quality time with our providers, and arriving late affects you and other patients whose appointments are after yours.  Also, if you no show three or more times for appointments you may be dismissed from the clinic.  Again, thank you for choosing [place] [facility] [place] hope is that these requests will allow you access to exceptional care and in a timely manner. _______________________________________________________________  If you have questions after your visit, please contact our office at [phone] between the hours of 8:30 a.m. and 5:00 p.m. Voicemails left after 4:30 p.m. will not be returned until the following business day. _______________________________________________________________  For prescription refill requests, have your pharmacy contact our office. _______________________________________________________________  Recommendations made by the consultant and any test results will be sent to your referring physician. _______________________________________________________________

## 2019-06-20 NOTE — Assessment & Plan Note (Addendum)
1. Leukocytosis -Observed in labs from November 2017 with a white blood cell count of 12.4, neutrophil count of 8.2. -Most recent labs from 05/29/2019 suggests white blood cell count of 12.6, neutrophils normal at 6.4, lymphocytes elevated at 4.5, monocytes elevated at 1.4. -We discussed differential diagnosis of elevated white blood cell count including but not limited to infectious process, medication, inflammation, autoimmune disorders, smoking, asplenia, and/or myeloproliferative disorder.  Patient has several contributing factors to elevated white blood cell count including chronic inflammation secondary to pulmonary fibrosis and smoking.  He has no B symptoms.  We will need to complete lab work-up to rule out any underlying etiology. -He will have labs done today followed by phone visit in 1 week.  2.  Lung disease -History of COPD, emphysema, pulmonary fibrosis.  He is followed by pulmonology.   - CT of chest abdomen pelvis in October 2020 suggests chronic lung disease with honeycombing.  Generalized airway thickening with multifocal airway collapse or possible mucoid impaction.  Emphysema.  Pleural thickening with trace pleural fluid.   -He continues to smoke 1/2 to 1 pack of cigarettes per day.  Patient was counseled on the harmful effects of smoking to his health and was advised to stop.  Cessation methods are available.  Addendum: -I have independently examined this patient and elicited history.  He was evaluated for elevated CBC, differential showing lymphocytosis.  Patient reports to me that he has been on prednisone 10 mg for the last 8 months started by Dr. Luan Pulling.  This was for his COPD.  He was reportedly told to be on oxygen but he has not been using it.  He also has not been using his inhalers as prescribed.  He does not have any B symptoms.  No palpable splenomegaly or lymphadenopathy.  As the most recent CBC showed elevated lymphocyte count, I have recommended doing a flow cytometry  along with LDH and a smear review.  We will also check ANA and rheumatoid factor.  He will be seen back for follow-up in 1-2 visits as a virtual visit.

## 2019-06-21 LAB — RHEUMATOID FACTOR: Rheumatoid fact SerPl-aCnc: 130.7 IU/mL — ABNORMAL HIGH (ref 0.0–13.9)

## 2019-06-21 LAB — ANTINUCLEAR ANTIBODIES, IFA: ANA Ab, IFA: NEGATIVE

## 2019-06-22 LAB — PATHOLOGIST SMEAR REVIEW

## 2019-06-23 LAB — CD19 AND CD20, FLOW CYTOMETRY

## 2019-06-29 ENCOUNTER — Inpatient Hospital Stay (HOSPITAL_COMMUNITY): Payer: Medicare Other | Admitting: Hematology

## 2019-07-02 ENCOUNTER — Other Ambulatory Visit: Payer: Self-pay

## 2019-07-02 ENCOUNTER — Inpatient Hospital Stay (HOSPITAL_COMMUNITY): Payer: Medicare Other

## 2019-07-02 DIAGNOSIS — R5383 Other fatigue: Secondary | ICD-10-CM | POA: Diagnosis not present

## 2019-07-02 DIAGNOSIS — K5900 Constipation, unspecified: Secondary | ICD-10-CM | POA: Diagnosis not present

## 2019-07-02 DIAGNOSIS — M549 Dorsalgia, unspecified: Secondary | ICD-10-CM | POA: Diagnosis not present

## 2019-07-02 DIAGNOSIS — J449 Chronic obstructive pulmonary disease, unspecified: Secondary | ICD-10-CM | POA: Diagnosis not present

## 2019-07-02 DIAGNOSIS — D72829 Elevated white blood cell count, unspecified: Secondary | ICD-10-CM

## 2019-07-02 DIAGNOSIS — J84112 Idiopathic pulmonary fibrosis: Secondary | ICD-10-CM | POA: Diagnosis not present

## 2019-07-03 LAB — FLOW CYTOMETRY REQUEST - FLUID (INPATIENT)

## 2019-07-03 LAB — SURGICAL PATHOLOGY

## 2019-07-10 ENCOUNTER — Encounter (HOSPITAL_COMMUNITY): Payer: Self-pay | Admitting: Hematology

## 2019-07-10 ENCOUNTER — Inpatient Hospital Stay (HOSPITAL_BASED_OUTPATIENT_CLINIC_OR_DEPARTMENT_OTHER): Payer: Medicare Other | Admitting: Hematology

## 2019-07-10 DIAGNOSIS — D72829 Elevated white blood cell count, unspecified: Secondary | ICD-10-CM

## 2019-07-10 NOTE — Progress Notes (Signed)
Virtual Visit via Telephone Note  I connected with Jordan May on 07/10/19 at  3:50 PM EST by telephone and verified that I am speaking with the correct person using two identifiers.   I discussed the limitations, risks, security and privacy concerns of performing an evaluation and management service by telephone and the availability of in person appointments. I also discussed with the patient that there may be a patient responsible charge related to this service. The patient expressed understanding and agreed to proceed.   History of Present Illness: This patient was seen in our clinic on 06/20/2019 for work-up of leukocytosis.  He has underlying COPD for which he is on prednisone 10 mg daily.  He also has diffuse arthritis for which he takes nabumetone.   Observations/Objective: Denied any fevers, night sweats or weight loss.  Denies any recurrent infections.  He is currently taking prednisone 10 mg daily.  He is a current active smoker, smokes 1 pack/day.   Assessment and Plan:  1.  Lymphocytic leukocytosis: -CBC on 06/20/2019 shows white count 14, differential showing absolute lymphocyte count of 5200 and monocyte count of 1400.  Smear review by pathology showed scattered atypical lymphocytes. -Flow cytometry showed no monoclonal B-cell or aberrant T-cell population identified.  There is a small population, 6% of lymphocytes of CD3/CD56 positive cells consistent with large granular lymphocytes seen on the smear. -Absolute LGL count is around 300.  Great majority of T LGL leukemia is expressed CD3, CD8, CD16, CD57 but do not usually express CD4, CD56 or CD28.  Uncommon CD3 plus/CD56 plus variants can be present. -I have recommended follow-up with CBCD and LDH in 6 months.  2.  Elevated rheumatoid factor: -He was never diagnosed with rheumatoid arthritis.  His rheumatoid factor was 130.7. -He has generalized arthritis for which he takes nabumetone.   Follow Up Instructions:    I  discussed the assessment and treatment plan with the patient. The patient was provided an opportunity to ask questions and all were answered. The patient agreed with the plan and demonstrated an understanding of the instructions.   The patient was advised to call back or seek an in-person evaluation if the symptoms worsen or if the condition fails to improve as anticipated.  I provided 12 minutes of non-face-to-face time during this encounter.   Derek Jack, MD

## 2019-07-24 DIAGNOSIS — G4709 Other insomnia: Secondary | ICD-10-CM | POA: Diagnosis not present

## 2019-07-24 DIAGNOSIS — L989 Disorder of the skin and subcutaneous tissue, unspecified: Secondary | ICD-10-CM | POA: Diagnosis not present

## 2019-07-24 DIAGNOSIS — Z6826 Body mass index (BMI) 26.0-26.9, adult: Secondary | ICD-10-CM | POA: Diagnosis not present

## 2019-07-24 DIAGNOSIS — G894 Chronic pain syndrome: Secondary | ICD-10-CM | POA: Diagnosis not present

## 2019-08-01 DIAGNOSIS — L570 Actinic keratosis: Secondary | ICD-10-CM | POA: Diagnosis not present

## 2019-08-01 DIAGNOSIS — X32XXXA Exposure to sunlight, initial encounter: Secondary | ICD-10-CM | POA: Diagnosis not present

## 2019-08-21 DIAGNOSIS — Z6826 Body mass index (BMI) 26.0-26.9, adult: Secondary | ICD-10-CM | POA: Diagnosis not present

## 2019-08-21 DIAGNOSIS — G894 Chronic pain syndrome: Secondary | ICD-10-CM | POA: Diagnosis not present

## 2019-08-21 DIAGNOSIS — G4700 Insomnia, unspecified: Secondary | ICD-10-CM | POA: Diagnosis not present

## 2019-08-21 DIAGNOSIS — E663 Overweight: Secondary | ICD-10-CM | POA: Diagnosis not present

## 2019-08-22 DIAGNOSIS — L821 Other seborrheic keratosis: Secondary | ICD-10-CM | POA: Diagnosis not present

## 2019-08-22 DIAGNOSIS — X32XXXD Exposure to sunlight, subsequent encounter: Secondary | ICD-10-CM | POA: Diagnosis not present

## 2019-08-22 DIAGNOSIS — L570 Actinic keratosis: Secondary | ICD-10-CM | POA: Diagnosis not present

## 2019-09-12 DIAGNOSIS — F4542 Pain disorder with related psychological factors: Secondary | ICD-10-CM | POA: Diagnosis not present

## 2019-09-12 DIAGNOSIS — G894 Chronic pain syndrome: Secondary | ICD-10-CM | POA: Diagnosis not present

## 2019-09-12 DIAGNOSIS — E7849 Other hyperlipidemia: Secondary | ICD-10-CM | POA: Diagnosis not present

## 2019-09-17 IMAGING — CT CT CHEST HIGH RESOLUTION W/O CM
2 of 5 series · 15 of 36 positions shown, 18 images · non-contrast
Comparison: 04/25/2018 chest radiograph.  02/19/2018 chest CT.

CLINICAL DATA: Dyspnea. Smoker. Follow-up interstitial lung
disease.

EXAM:
CT CHEST WITHOUT CONTRAST
TECHNIQUE: Multidetector CT imaging of the chest was performed following the
standard protocol without intravenous contrast. High resolution
imaging of the lungs, as well as inspiratory and expiratory imaging,
was performed.

[Series 3: thorax · axial · 0.62mm/px · z∈[+1343,+1627]mm · 12 of 158 slices shown, 15 images]
[im 8/158  mediastinal]
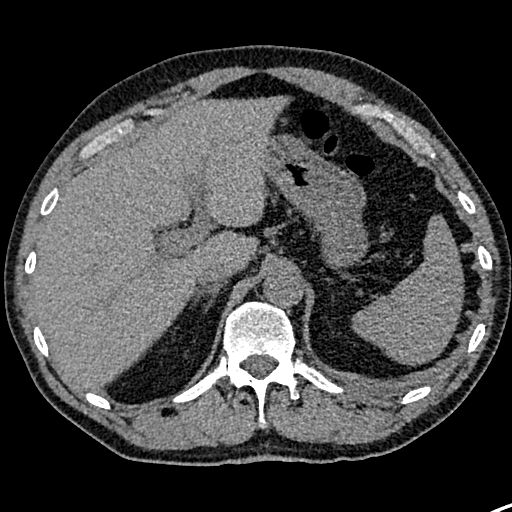
[im 8/158  lung]
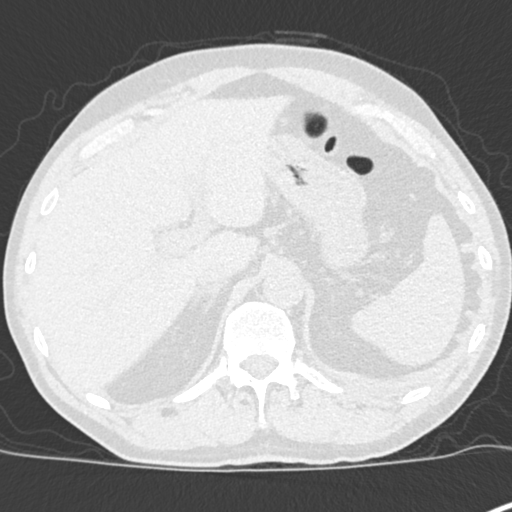
[im 23/158  lung]
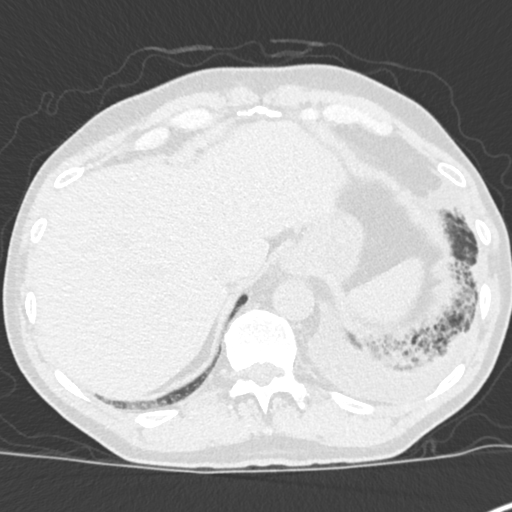
[im 38/158  lung]
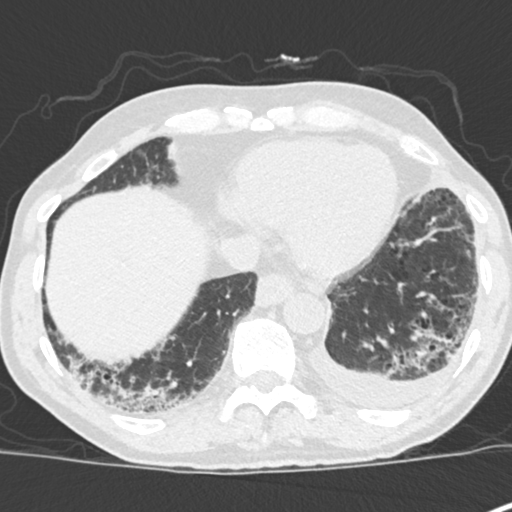
[im 45/158  lung]
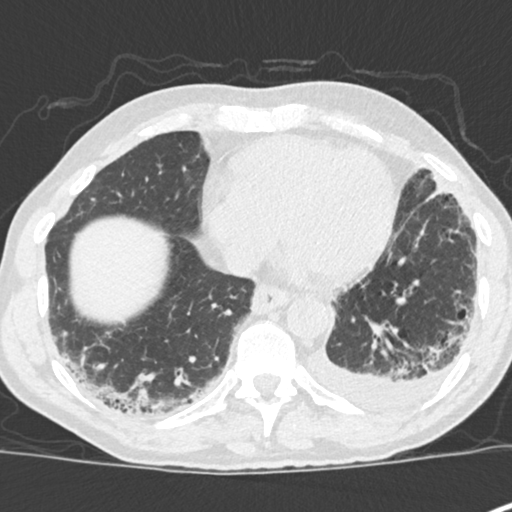
[im 60/158  mediastinal]
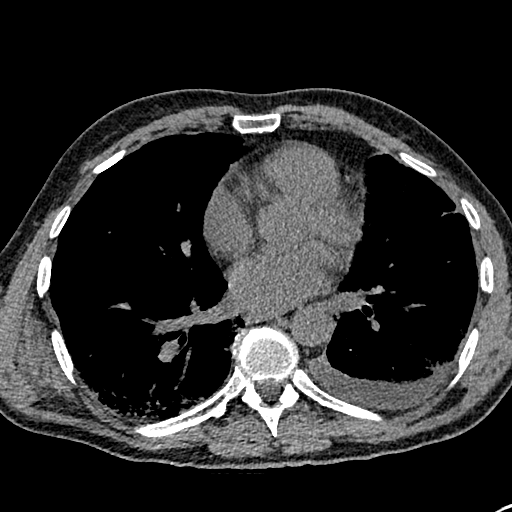
[im 60/158  lung]
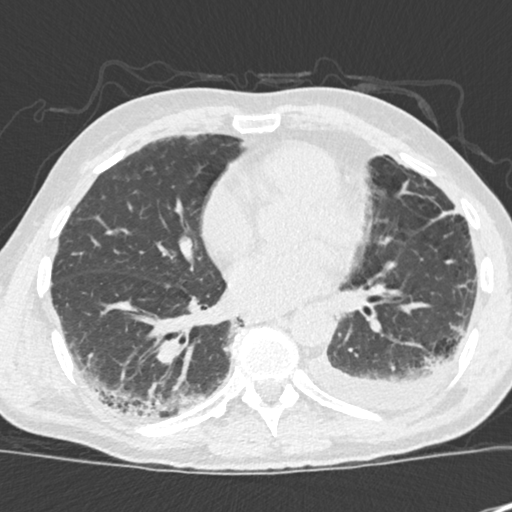
[im 75/158  lung]
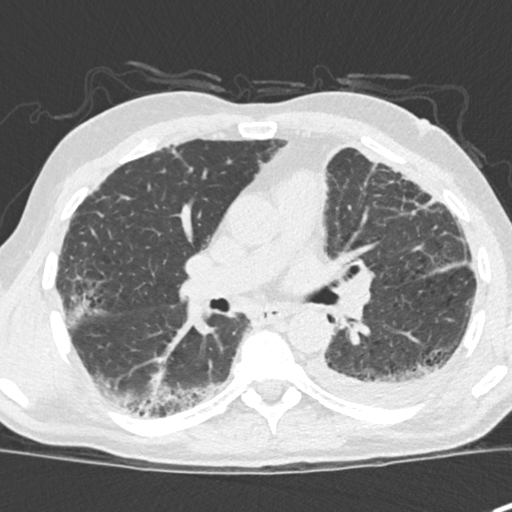
[im 83/158  lung]
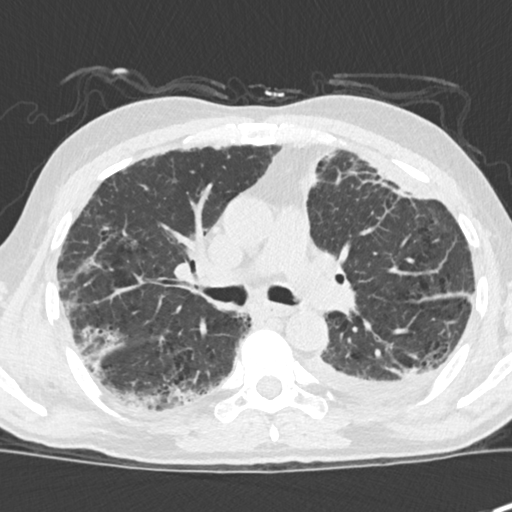
[im 98/158  lung]
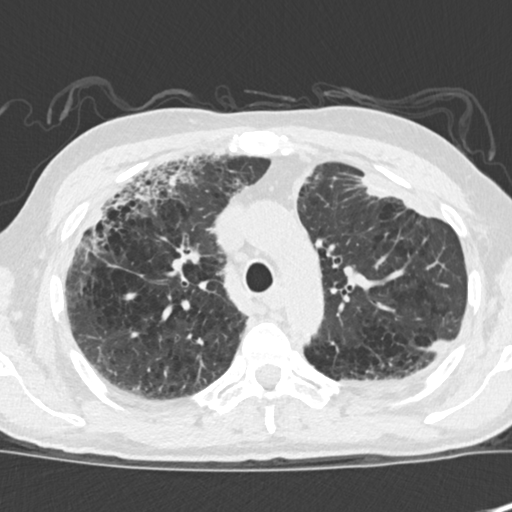
[im 113/158  mediastinal]
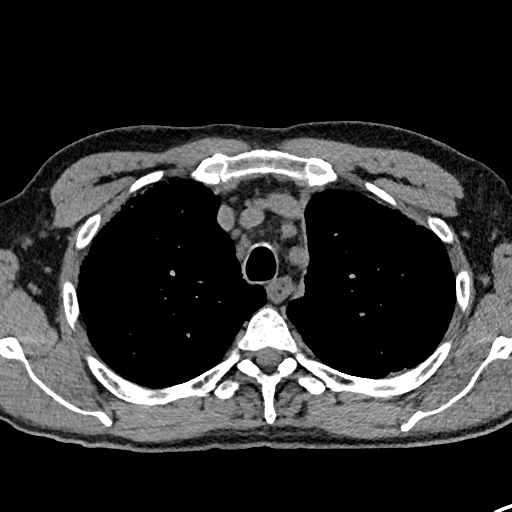
[im 113/158  lung]
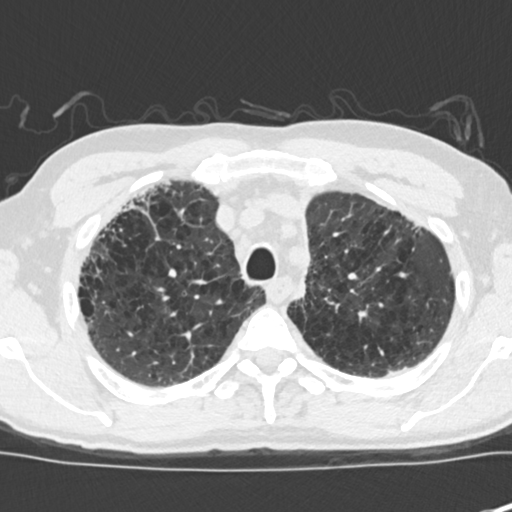
[im 120/158  lung]
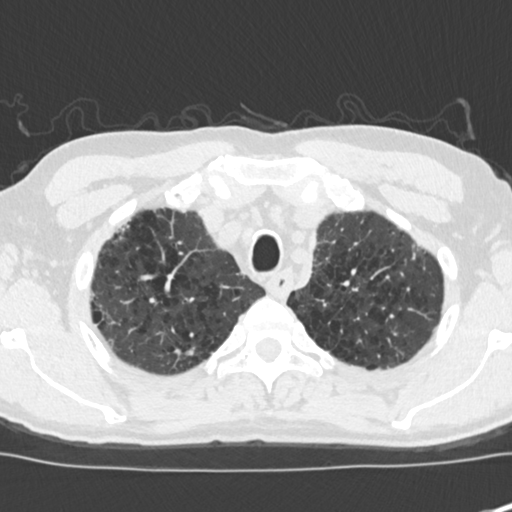
[im 135/158  lung]
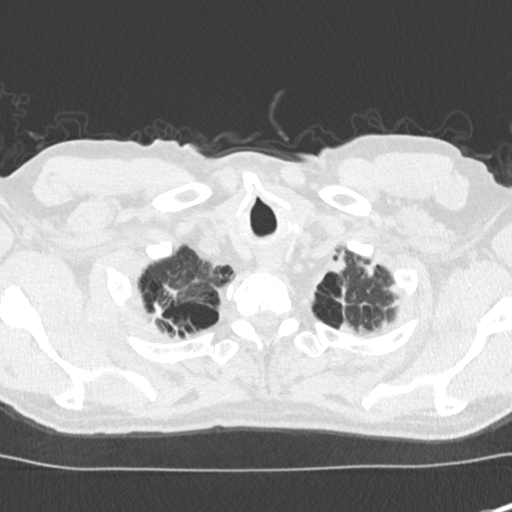
[im 150/158  lung]
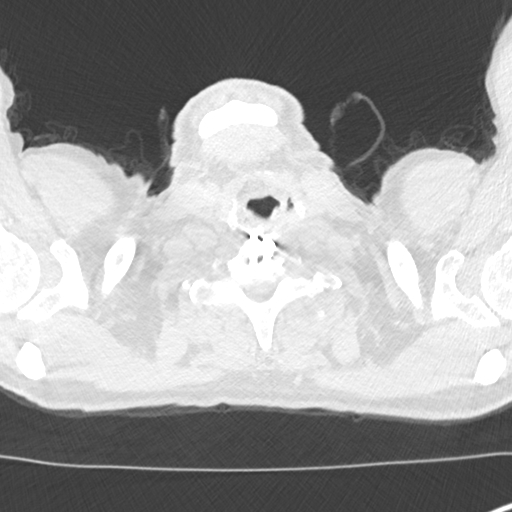

[Series 9: coronal · coronal · 0.57mm/px · 3 of 129 slices shown]
[im 26/129  lung]
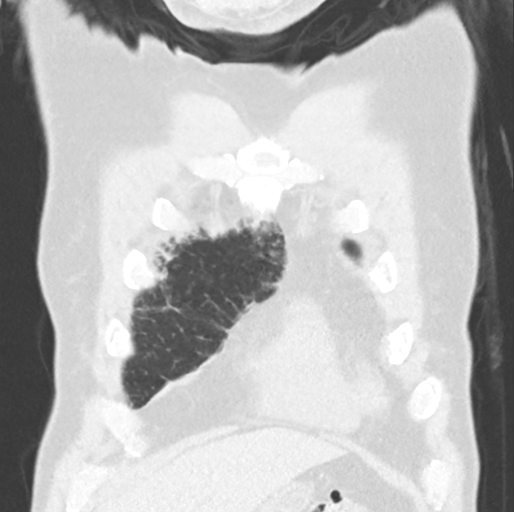
[im 52/129  lung]
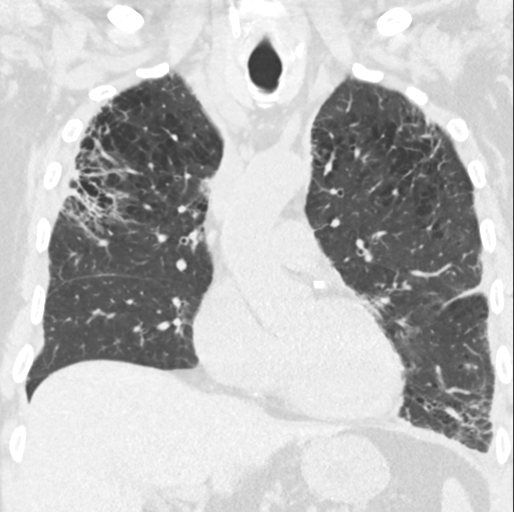
[im 77/129  lung]
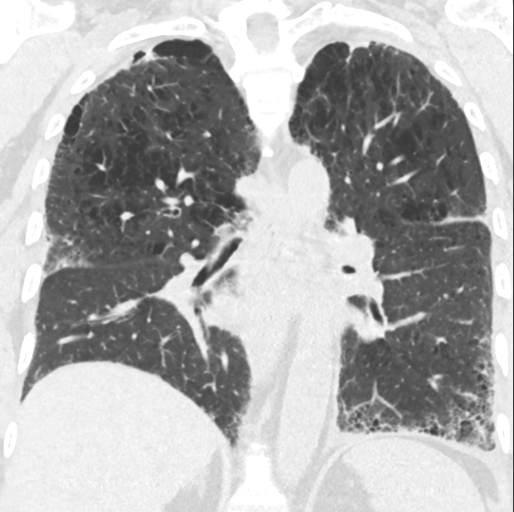

[15 of 36 positions shown; findings below may reference images not displayed]

FINDINGS: Cardiovascular: Normal heart size. No significant pericardial
effusion/thickening. Left anterior descending coronary
atherosclerosis. Atherosclerotic nonaneurysmal thoracic aorta.
Normal caliber pulmonary arteries.

Mediastinum/Nodes: No discrete thyroid nodules. Unremarkable
esophagus. No pathologically enlarged axillary, mediastinal or hilar
lymph nodes, noting limited sensitivity for the detection of hilar
adenopathy on this noncontrast study.

Lungs/Pleura: No pneumothorax. No right pleural effusion. Small
stable loculated left pleural effusion, predominantly posterior.
Stable mild left pleural thickening without significant calcified
pleural plaques. Moderate centrilobular and paraseptal emphysema
with diffuse bronchial wall thickening. No acute consolidative
airspace disease or lung masses. Solid 4 mm right middle lobe
pulmonary nodule (series 7/image 117) is stable since 09/16/2014
chest CT, considered benign. No new significant pulmonary nodules.
There is moderate patchy subpleural reticulation and ground-glass
attenuation throughout both lungs with associated mild traction
bronchiectasis and architectural distortion. No clear apical basilar
gradient. No significant lobular air trapping on the expiration
sequence. Scattered mild honeycombing in the right greater than left
lungs. Findings have mildly progressed since 09/16/2014 chest CT.

Upper abdomen: No acute abnormality.

Musculoskeletal: No aggressive appearing focal osseous lesions. Mild
gynecomastia, asymmetric to the right, unchanged since 02/19/2018
chest CT. Surgical hardware from ACDF is partially visualized in the
lower cervical spine. Mild thoracic spondylosis.
IMPRESSION: 1. Spectrum of findings compatible with fibrotic interstitial lung
disease with mild honeycombing, with progression since 1947 chest
CT. No clear apicobasilar gradient. Findings are categorized as
probable UIP per consensus guidelines: Diagnosis of Idiopathic
Pulmonary Fibrosis: An Official ATS/ERS/JRS/ALAT Clinical Practice
Guideline. Am J Respir Crit Care Med Vol 198, Don Lolito 5, ppe11-[id number][DATE]. Stable chronic small loculated left pleural effusion with mild
left pleural thickening.
3. One vessel coronary atherosclerosis.

Aortic Atherosclerosis (48HUH-JY7.7) and Emphysema (48HUH-4JZ.U).

## 2019-09-18 DIAGNOSIS — E7849 Other hyperlipidemia: Secondary | ICD-10-CM | POA: Diagnosis not present

## 2019-09-18 DIAGNOSIS — G4709 Other insomnia: Secondary | ICD-10-CM | POA: Diagnosis not present

## 2019-09-18 DIAGNOSIS — B351 Tinea unguium: Secondary | ICD-10-CM | POA: Diagnosis not present

## 2019-09-18 DIAGNOSIS — Z6826 Body mass index (BMI) 26.0-26.9, adult: Secondary | ICD-10-CM | POA: Diagnosis not present

## 2019-09-18 DIAGNOSIS — G894 Chronic pain syndrome: Secondary | ICD-10-CM | POA: Diagnosis not present

## 2019-10-22 DIAGNOSIS — F17290 Nicotine dependence, other tobacco product, uncomplicated: Secondary | ICD-10-CM | POA: Diagnosis not present

## 2019-10-22 DIAGNOSIS — G894 Chronic pain syndrome: Secondary | ICD-10-CM | POA: Diagnosis not present

## 2019-10-22 DIAGNOSIS — Z6826 Body mass index (BMI) 26.0-26.9, adult: Secondary | ICD-10-CM | POA: Diagnosis not present

## 2019-10-22 DIAGNOSIS — E663 Overweight: Secondary | ICD-10-CM | POA: Diagnosis not present

## 2019-11-12 DIAGNOSIS — F4542 Pain disorder with related psychological factors: Secondary | ICD-10-CM | POA: Diagnosis not present

## 2019-11-12 DIAGNOSIS — G894 Chronic pain syndrome: Secondary | ICD-10-CM | POA: Diagnosis not present

## 2019-11-12 DIAGNOSIS — E7849 Other hyperlipidemia: Secondary | ICD-10-CM | POA: Diagnosis not present

## 2019-11-21 DIAGNOSIS — L570 Actinic keratosis: Secondary | ICD-10-CM | POA: Diagnosis not present

## 2019-11-21 DIAGNOSIS — X32XXXD Exposure to sunlight, subsequent encounter: Secondary | ICD-10-CM | POA: Diagnosis not present

## 2019-11-22 ENCOUNTER — Other Ambulatory Visit: Payer: Self-pay

## 2019-11-22 ENCOUNTER — Ambulatory Visit (HOSPITAL_COMMUNITY)
Admission: RE | Admit: 2019-11-22 | Discharge: 2019-11-22 | Disposition: A | Discharge status: Home / Self Care | Payer: Medicare Other | Source: Ambulatory Visit | Attending: Physician Assistant | Admitting: Physician Assistant

## 2019-11-22 ENCOUNTER — Other Ambulatory Visit (HOSPITAL_COMMUNITY): Payer: Self-pay | Admitting: Physician Assistant

## 2019-11-22 DIAGNOSIS — I959 Hypotension, unspecified: Secondary | ICD-10-CM | POA: Diagnosis not present

## 2019-11-22 DIAGNOSIS — J439 Emphysema, unspecified: Secondary | ICD-10-CM | POA: Diagnosis not present

## 2019-11-22 DIAGNOSIS — R0602 Shortness of breath: Secondary | ICD-10-CM

## 2019-11-22 DIAGNOSIS — G894 Chronic pain syndrome: Secondary | ICD-10-CM | POA: Diagnosis not present

## 2019-11-22 DIAGNOSIS — E663 Overweight: Secondary | ICD-10-CM | POA: Diagnosis not present

## 2019-11-22 DIAGNOSIS — Z6826 Body mass index (BMI) 26.0-26.9, adult: Secondary | ICD-10-CM | POA: Diagnosis not present

## 2019-11-22 DIAGNOSIS — R5383 Other fatigue: Secondary | ICD-10-CM | POA: Diagnosis not present

## 2019-11-22 DIAGNOSIS — J449 Chronic obstructive pulmonary disease, unspecified: Secondary | ICD-10-CM | POA: Diagnosis not present

## 2019-11-22 DIAGNOSIS — F17200 Nicotine dependence, unspecified, uncomplicated: Secondary | ICD-10-CM | POA: Diagnosis not present

## 2020-01-09 ENCOUNTER — Inpatient Hospital Stay (HOSPITAL_COMMUNITY): Payer: Medicare Other | Attending: Hematology

## 2020-01-16 ENCOUNTER — Ambulatory Visit (HOSPITAL_COMMUNITY): Payer: PRIVATE HEALTH INSURANCE | Admitting: Hematology

## 2020-01-22 DIAGNOSIS — G894 Chronic pain syndrome: Secondary | ICD-10-CM | POA: Diagnosis not present

## 2020-01-22 DIAGNOSIS — M5030 Other cervical disc degeneration, unspecified cervical region: Secondary | ICD-10-CM | POA: Diagnosis not present

## 2020-01-22 DIAGNOSIS — Z6826 Body mass index (BMI) 26.0-26.9, adult: Secondary | ICD-10-CM | POA: Diagnosis not present

## 2020-01-22 DIAGNOSIS — M1991 Primary osteoarthritis, unspecified site: Secondary | ICD-10-CM | POA: Diagnosis not present

## 2020-02-20 DIAGNOSIS — Z23 Encounter for immunization: Secondary | ICD-10-CM | POA: Diagnosis not present

## 2020-02-25 DIAGNOSIS — J449 Chronic obstructive pulmonary disease, unspecified: Secondary | ICD-10-CM | POA: Diagnosis not present

## 2020-02-25 DIAGNOSIS — N529 Male erectile dysfunction, unspecified: Secondary | ICD-10-CM | POA: Diagnosis not present

## 2020-02-25 DIAGNOSIS — G894 Chronic pain syndrome: Secondary | ICD-10-CM | POA: Diagnosis not present

## 2020-02-25 DIAGNOSIS — Z681 Body mass index (BMI) 19 or less, adult: Secondary | ICD-10-CM | POA: Diagnosis not present

## 2020-02-26 DIAGNOSIS — L570 Actinic keratosis: Secondary | ICD-10-CM | POA: Diagnosis not present

## 2020-02-26 DIAGNOSIS — X32XXXD Exposure to sunlight, subsequent encounter: Secondary | ICD-10-CM | POA: Diagnosis not present

## 2020-02-26 DIAGNOSIS — L821 Other seborrheic keratosis: Secondary | ICD-10-CM | POA: Diagnosis not present

## 2020-03-13 DIAGNOSIS — F4542 Pain disorder with related psychological factors: Secondary | ICD-10-CM | POA: Diagnosis not present

## 2020-03-13 DIAGNOSIS — G894 Chronic pain syndrome: Secondary | ICD-10-CM | POA: Diagnosis not present

## 2020-03-13 DIAGNOSIS — E7849 Other hyperlipidemia: Secondary | ICD-10-CM | POA: Diagnosis not present

## 2020-03-27 DIAGNOSIS — E663 Overweight: Secondary | ICD-10-CM | POA: Diagnosis not present

## 2020-03-27 DIAGNOSIS — G894 Chronic pain syndrome: Secondary | ICD-10-CM | POA: Diagnosis not present

## 2020-03-27 DIAGNOSIS — Z6826 Body mass index (BMI) 26.0-26.9, adult: Secondary | ICD-10-CM | POA: Diagnosis not present

## 2020-04-01 DIAGNOSIS — D044 Carcinoma in situ of skin of scalp and neck: Secondary | ICD-10-CM | POA: Diagnosis not present

## 2020-04-01 DIAGNOSIS — C44212 Basal cell carcinoma of skin of right ear and external auricular canal: Secondary | ICD-10-CM | POA: Diagnosis not present

## 2020-04-01 DIAGNOSIS — Z23 Encounter for immunization: Secondary | ICD-10-CM | POA: Diagnosis not present

## 2020-04-01 DIAGNOSIS — C44319 Basal cell carcinoma of skin of other parts of face: Secondary | ICD-10-CM | POA: Diagnosis not present

## 2020-04-12 DIAGNOSIS — G894 Chronic pain syndrome: Secondary | ICD-10-CM | POA: Diagnosis not present

## 2020-04-12 DIAGNOSIS — E7849 Other hyperlipidemia: Secondary | ICD-10-CM | POA: Diagnosis not present

## 2020-04-12 DIAGNOSIS — F4542 Pain disorder with related psychological factors: Secondary | ICD-10-CM | POA: Diagnosis not present

## 2020-04-17 DIAGNOSIS — E663 Overweight: Secondary | ICD-10-CM | POA: Diagnosis not present

## 2020-04-17 DIAGNOSIS — Z6826 Body mass index (BMI) 26.0-26.9, adult: Secondary | ICD-10-CM | POA: Diagnosis not present

## 2020-04-17 DIAGNOSIS — T1490XA Injury, unspecified, initial encounter: Secondary | ICD-10-CM | POA: Diagnosis not present

## 2020-04-17 DIAGNOSIS — G894 Chronic pain syndrome: Secondary | ICD-10-CM | POA: Diagnosis not present

## 2020-05-05 DIAGNOSIS — Z85828 Personal history of other malignant neoplasm of skin: Secondary | ICD-10-CM | POA: Diagnosis not present

## 2020-05-05 DIAGNOSIS — L570 Actinic keratosis: Secondary | ICD-10-CM | POA: Diagnosis not present

## 2020-05-05 DIAGNOSIS — Z08 Encounter for follow-up examination after completed treatment for malignant neoplasm: Secondary | ICD-10-CM | POA: Diagnosis not present

## 2020-05-05 DIAGNOSIS — X32XXXD Exposure to sunlight, subsequent encounter: Secondary | ICD-10-CM | POA: Diagnosis not present

## 2020-05-13 DIAGNOSIS — F4542 Pain disorder with related psychological factors: Secondary | ICD-10-CM | POA: Diagnosis not present

## 2020-05-13 DIAGNOSIS — E7849 Other hyperlipidemia: Secondary | ICD-10-CM | POA: Diagnosis not present

## 2020-05-13 DIAGNOSIS — G894 Chronic pain syndrome: Secondary | ICD-10-CM | POA: Diagnosis not present

## 2020-05-28 DIAGNOSIS — M1991 Primary osteoarthritis, unspecified site: Secondary | ICD-10-CM | POA: Diagnosis not present

## 2020-05-28 DIAGNOSIS — G894 Chronic pain syndrome: Secondary | ICD-10-CM | POA: Diagnosis not present

## 2020-05-28 DIAGNOSIS — Z6825 Body mass index (BMI) 25.0-25.9, adult: Secondary | ICD-10-CM | POA: Diagnosis not present

## 2020-05-28 DIAGNOSIS — M5030 Other cervical disc degeneration, unspecified cervical region: Secondary | ICD-10-CM | POA: Diagnosis not present

## 2020-06-13 DIAGNOSIS — G894 Chronic pain syndrome: Secondary | ICD-10-CM | POA: Diagnosis not present

## 2020-06-13 DIAGNOSIS — E7849 Other hyperlipidemia: Secondary | ICD-10-CM | POA: Diagnosis not present

## 2020-06-13 DIAGNOSIS — F4542 Pain disorder with related psychological factors: Secondary | ICD-10-CM | POA: Diagnosis not present

## 2020-06-16 DIAGNOSIS — X32XXXD Exposure to sunlight, subsequent encounter: Secondary | ICD-10-CM | POA: Diagnosis not present

## 2020-06-16 DIAGNOSIS — C44622 Squamous cell carcinoma of skin of right upper limb, including shoulder: Secondary | ICD-10-CM | POA: Diagnosis not present

## 2020-06-16 DIAGNOSIS — L570 Actinic keratosis: Secondary | ICD-10-CM | POA: Diagnosis not present

## 2020-06-23 DIAGNOSIS — J9621 Acute and chronic respiratory failure with hypoxia: Secondary | ICD-10-CM | POA: Diagnosis not present

## 2020-06-23 DIAGNOSIS — R0602 Shortness of breath: Secondary | ICD-10-CM | POA: Diagnosis not present

## 2020-06-23 DIAGNOSIS — R069 Unspecified abnormalities of breathing: Secondary | ICD-10-CM | POA: Diagnosis not present

## 2020-06-23 DIAGNOSIS — Z720 Tobacco use: Secondary | ICD-10-CM | POA: Diagnosis not present

## 2020-06-23 DIAGNOSIS — J9601 Acute respiratory failure with hypoxia: Secondary | ICD-10-CM | POA: Diagnosis not present

## 2020-06-23 DIAGNOSIS — J9600 Acute respiratory failure, unspecified whether with hypoxia or hypercapnia: Secondary | ICD-10-CM | POA: Diagnosis not present

## 2020-06-23 DIAGNOSIS — J8410 Pulmonary fibrosis, unspecified: Secondary | ICD-10-CM | POA: Diagnosis present

## 2020-06-23 DIAGNOSIS — M79605 Pain in left leg: Secondary | ICD-10-CM | POA: Diagnosis not present

## 2020-06-23 DIAGNOSIS — J90 Pleural effusion, not elsewhere classified: Secondary | ICD-10-CM | POA: Diagnosis not present

## 2020-06-23 DIAGNOSIS — R079 Chest pain, unspecified: Secondary | ICD-10-CM | POA: Diagnosis not present

## 2020-06-23 DIAGNOSIS — Z20822 Contact with and (suspected) exposure to covid-19: Secondary | ICD-10-CM | POA: Diagnosis not present

## 2020-06-23 DIAGNOSIS — M25552 Pain in left hip: Secondary | ICD-10-CM | POA: Insufficient documentation

## 2020-06-23 DIAGNOSIS — M1612 Unilateral primary osteoarthritis, left hip: Secondary | ICD-10-CM | POA: Diagnosis not present

## 2020-06-23 DIAGNOSIS — E785 Hyperlipidemia, unspecified: Secondary | ICD-10-CM | POA: Diagnosis present

## 2020-06-23 DIAGNOSIS — M25452 Effusion, left hip: Secondary | ICD-10-CM | POA: Diagnosis not present

## 2020-06-23 DIAGNOSIS — F17200 Nicotine dependence, unspecified, uncomplicated: Secondary | ICD-10-CM | POA: Diagnosis present

## 2020-06-23 DIAGNOSIS — R0902 Hypoxemia: Secondary | ICD-10-CM | POA: Diagnosis not present

## 2020-06-23 DIAGNOSIS — R52 Pain, unspecified: Secondary | ICD-10-CM | POA: Diagnosis not present

## 2020-06-23 DIAGNOSIS — J441 Chronic obstructive pulmonary disease with (acute) exacerbation: Secondary | ICD-10-CM | POA: Diagnosis not present

## 2020-06-23 DIAGNOSIS — J439 Emphysema, unspecified: Secondary | ICD-10-CM | POA: Diagnosis not present

## 2020-06-23 DIAGNOSIS — I878 Other specified disorders of veins: Secondary | ICD-10-CM | POA: Diagnosis not present

## 2020-06-24 DIAGNOSIS — M25452 Effusion, left hip: Secondary | ICD-10-CM | POA: Diagnosis not present

## 2020-06-24 DIAGNOSIS — F17200 Nicotine dependence, unspecified, uncomplicated: Secondary | ICD-10-CM | POA: Diagnosis present

## 2020-06-24 DIAGNOSIS — M25552 Pain in left hip: Secondary | ICD-10-CM | POA: Diagnosis not present

## 2020-06-24 DIAGNOSIS — E785 Hyperlipidemia, unspecified: Secondary | ICD-10-CM | POA: Diagnosis present

## 2020-06-24 DIAGNOSIS — J441 Chronic obstructive pulmonary disease with (acute) exacerbation: Secondary | ICD-10-CM | POA: Diagnosis not present

## 2020-06-24 DIAGNOSIS — J9600 Acute respiratory failure, unspecified whether with hypoxia or hypercapnia: Secondary | ICD-10-CM | POA: Diagnosis not present

## 2020-06-24 DIAGNOSIS — J8410 Pulmonary fibrosis, unspecified: Secondary | ICD-10-CM | POA: Diagnosis present

## 2020-06-24 DIAGNOSIS — M1612 Unilateral primary osteoarthritis, left hip: Secondary | ICD-10-CM | POA: Diagnosis present

## 2020-06-24 DIAGNOSIS — J9621 Acute and chronic respiratory failure with hypoxia: Secondary | ICD-10-CM | POA: Diagnosis not present

## 2020-06-24 DIAGNOSIS — J439 Emphysema, unspecified: Secondary | ICD-10-CM | POA: Diagnosis present

## 2020-06-24 DIAGNOSIS — Z20822 Contact with and (suspected) exposure to covid-19: Secondary | ICD-10-CM | POA: Diagnosis present

## 2020-06-30 DIAGNOSIS — G894 Chronic pain syndrome: Secondary | ICD-10-CM | POA: Diagnosis not present

## 2020-07-14 DIAGNOSIS — F4542 Pain disorder with related psychological factors: Secondary | ICD-10-CM | POA: Diagnosis not present

## 2020-07-14 DIAGNOSIS — E7849 Other hyperlipidemia: Secondary | ICD-10-CM | POA: Diagnosis not present

## 2020-07-14 DIAGNOSIS — G894 Chronic pain syndrome: Secondary | ICD-10-CM | POA: Diagnosis not present

## 2020-07-29 DIAGNOSIS — Z08 Encounter for follow-up examination after completed treatment for malignant neoplasm: Secondary | ICD-10-CM | POA: Diagnosis not present

## 2020-07-29 DIAGNOSIS — Z85828 Personal history of other malignant neoplasm of skin: Secondary | ICD-10-CM | POA: Diagnosis not present

## 2020-07-29 DIAGNOSIS — L570 Actinic keratosis: Secondary | ICD-10-CM | POA: Diagnosis not present

## 2020-07-29 DIAGNOSIS — X32XXXD Exposure to sunlight, subsequent encounter: Secondary | ICD-10-CM | POA: Diagnosis not present

## 2020-07-31 DIAGNOSIS — Z1331 Encounter for screening for depression: Secondary | ICD-10-CM | POA: Diagnosis not present

## 2020-07-31 DIAGNOSIS — M1991 Primary osteoarthritis, unspecified site: Secondary | ICD-10-CM | POA: Diagnosis not present

## 2020-07-31 DIAGNOSIS — G894 Chronic pain syndrome: Secondary | ICD-10-CM | POA: Diagnosis not present

## 2020-07-31 DIAGNOSIS — J449 Chronic obstructive pulmonary disease, unspecified: Secondary | ICD-10-CM | POA: Diagnosis not present

## 2020-07-31 DIAGNOSIS — Z6824 Body mass index (BMI) 24.0-24.9, adult: Secondary | ICD-10-CM | POA: Diagnosis not present

## 2020-08-28 DIAGNOSIS — M1991 Primary osteoarthritis, unspecified site: Secondary | ICD-10-CM | POA: Diagnosis not present

## 2020-08-28 DIAGNOSIS — G894 Chronic pain syndrome: Secondary | ICD-10-CM | POA: Diagnosis not present

## 2020-08-28 DIAGNOSIS — M109 Gout, unspecified: Secondary | ICD-10-CM | POA: Diagnosis not present

## 2020-08-28 DIAGNOSIS — Z6825 Body mass index (BMI) 25.0-25.9, adult: Secondary | ICD-10-CM | POA: Diagnosis not present

## 2020-09-25 DIAGNOSIS — Z6825 Body mass index (BMI) 25.0-25.9, adult: Secondary | ICD-10-CM | POA: Diagnosis not present

## 2020-09-25 DIAGNOSIS — M1991 Primary osteoarthritis, unspecified site: Secondary | ICD-10-CM | POA: Diagnosis not present

## 2020-09-25 DIAGNOSIS — G894 Chronic pain syndrome: Secondary | ICD-10-CM | POA: Diagnosis not present

## 2020-09-25 DIAGNOSIS — M5030 Other cervical disc degeneration, unspecified cervical region: Secondary | ICD-10-CM | POA: Diagnosis not present

## 2020-10-11 DIAGNOSIS — F4542 Pain disorder with related psychological factors: Secondary | ICD-10-CM | POA: Diagnosis not present

## 2020-10-11 DIAGNOSIS — G894 Chronic pain syndrome: Secondary | ICD-10-CM | POA: Diagnosis not present

## 2020-10-11 DIAGNOSIS — E7849 Other hyperlipidemia: Secondary | ICD-10-CM | POA: Diagnosis not present

## 2020-10-23 DIAGNOSIS — M5030 Other cervical disc degeneration, unspecified cervical region: Secondary | ICD-10-CM | POA: Diagnosis not present

## 2020-10-23 DIAGNOSIS — G894 Chronic pain syndrome: Secondary | ICD-10-CM | POA: Diagnosis not present

## 2020-10-23 DIAGNOSIS — Z681 Body mass index (BMI) 19 or less, adult: Secondary | ICD-10-CM | POA: Diagnosis not present

## 2020-11-05 DIAGNOSIS — L570 Actinic keratosis: Secondary | ICD-10-CM | POA: Diagnosis not present

## 2020-11-05 DIAGNOSIS — X32XXXD Exposure to sunlight, subsequent encounter: Secondary | ICD-10-CM | POA: Diagnosis not present

## 2020-11-24 DIAGNOSIS — Z6825 Body mass index (BMI) 25.0-25.9, adult: Secondary | ICD-10-CM | POA: Diagnosis not present

## 2020-11-24 DIAGNOSIS — E663 Overweight: Secondary | ICD-10-CM | POA: Diagnosis not present

## 2020-11-24 DIAGNOSIS — J449 Chronic obstructive pulmonary disease, unspecified: Secondary | ICD-10-CM | POA: Diagnosis not present

## 2020-11-24 DIAGNOSIS — G894 Chronic pain syndrome: Secondary | ICD-10-CM | POA: Diagnosis not present

## 2020-12-11 DIAGNOSIS — E782 Mixed hyperlipidemia: Secondary | ICD-10-CM | POA: Diagnosis not present

## 2020-12-11 DIAGNOSIS — G894 Chronic pain syndrome: Secondary | ICD-10-CM | POA: Diagnosis not present

## 2020-12-11 DIAGNOSIS — F4542 Pain disorder with related psychological factors: Secondary | ICD-10-CM | POA: Diagnosis not present

## 2020-12-23 DIAGNOSIS — E663 Overweight: Secondary | ICD-10-CM | POA: Diagnosis not present

## 2020-12-23 DIAGNOSIS — Z6825 Body mass index (BMI) 25.0-25.9, adult: Secondary | ICD-10-CM | POA: Diagnosis not present

## 2020-12-23 DIAGNOSIS — J449 Chronic obstructive pulmonary disease, unspecified: Secondary | ICD-10-CM | POA: Diagnosis not present

## 2020-12-23 DIAGNOSIS — G894 Chronic pain syndrome: Secondary | ICD-10-CM | POA: Diagnosis not present

## 2021-01-14 DIAGNOSIS — X32XXXD Exposure to sunlight, subsequent encounter: Secondary | ICD-10-CM | POA: Diagnosis not present

## 2021-01-14 DIAGNOSIS — L570 Actinic keratosis: Secondary | ICD-10-CM | POA: Diagnosis not present

## 2021-01-22 DIAGNOSIS — Z6825 Body mass index (BMI) 25.0-25.9, adult: Secondary | ICD-10-CM | POA: Diagnosis not present

## 2021-01-22 DIAGNOSIS — Z0001 Encounter for general adult medical examination with abnormal findings: Secondary | ICD-10-CM | POA: Diagnosis not present

## 2021-01-22 DIAGNOSIS — E663 Overweight: Secondary | ICD-10-CM | POA: Diagnosis not present

## 2021-01-22 DIAGNOSIS — Z1331 Encounter for screening for depression: Secondary | ICD-10-CM | POA: Diagnosis not present

## 2021-02-11 DIAGNOSIS — Z20822 Contact with and (suspected) exposure to covid-19: Secondary | ICD-10-CM | POA: Diagnosis not present

## 2021-02-23 DIAGNOSIS — M1991 Primary osteoarthritis, unspecified site: Secondary | ICD-10-CM | POA: Diagnosis not present

## 2021-02-23 DIAGNOSIS — G894 Chronic pain syndrome: Secondary | ICD-10-CM | POA: Diagnosis not present

## 2021-02-23 DIAGNOSIS — E663 Overweight: Secondary | ICD-10-CM | POA: Diagnosis not present

## 2021-02-23 DIAGNOSIS — Z6825 Body mass index (BMI) 25.0-25.9, adult: Secondary | ICD-10-CM | POA: Diagnosis not present

## 2021-02-23 DIAGNOSIS — M5030 Other cervical disc degeneration, unspecified cervical region: Secondary | ICD-10-CM | POA: Diagnosis not present

## 2021-03-10 DIAGNOSIS — I491 Atrial premature depolarization: Secondary | ICD-10-CM | POA: Diagnosis not present

## 2021-03-10 DIAGNOSIS — Y999 Unspecified external cause status: Secondary | ICD-10-CM | POA: Diagnosis not present

## 2021-03-10 DIAGNOSIS — S22080A Wedge compression fracture of T11-T12 vertebra, initial encounter for closed fracture: Secondary | ICD-10-CM | POA: Diagnosis not present

## 2021-03-10 DIAGNOSIS — S299XXA Unspecified injury of thorax, initial encounter: Secondary | ICD-10-CM | POA: Diagnosis not present

## 2021-03-10 DIAGNOSIS — S32031A Stable burst fracture of third lumbar vertebra, initial encounter for closed fracture: Secondary | ICD-10-CM | POA: Diagnosis not present

## 2021-03-10 DIAGNOSIS — S3991XA Unspecified injury of abdomen, initial encounter: Secondary | ICD-10-CM | POA: Diagnosis not present

## 2021-03-10 DIAGNOSIS — S0990XA Unspecified injury of head, initial encounter: Secondary | ICD-10-CM | POA: Diagnosis not present

## 2021-03-10 DIAGNOSIS — I10 Essential (primary) hypertension: Secondary | ICD-10-CM | POA: Diagnosis not present

## 2021-03-10 DIAGNOSIS — S32032A Unstable burst fracture of third lumbar vertebra, initial encounter for closed fracture: Secondary | ICD-10-CM | POA: Diagnosis not present

## 2021-03-10 DIAGNOSIS — K567 Ileus, unspecified: Secondary | ICD-10-CM | POA: Diagnosis not present

## 2021-03-10 DIAGNOSIS — Z20822 Contact with and (suspected) exposure to covid-19: Secondary | ICD-10-CM | POA: Diagnosis not present

## 2021-03-10 DIAGNOSIS — W19XXXA Unspecified fall, initial encounter: Secondary | ICD-10-CM | POA: Diagnosis not present

## 2021-03-10 DIAGNOSIS — S199XXA Unspecified injury of neck, initial encounter: Secondary | ICD-10-CM | POA: Diagnosis not present

## 2021-03-10 DIAGNOSIS — S22089A Unspecified fracture of T11-T12 vertebra, initial encounter for closed fracture: Secondary | ICD-10-CM | POA: Diagnosis not present

## 2021-03-10 DIAGNOSIS — W309XXA Contact with unspecified agricultural machinery, initial encounter: Secondary | ICD-10-CM | POA: Diagnosis not present

## 2021-03-10 DIAGNOSIS — I4891 Unspecified atrial fibrillation: Secondary | ICD-10-CM | POA: Diagnosis not present

## 2021-03-10 DIAGNOSIS — Z23 Encounter for immunization: Secondary | ICD-10-CM | POA: Diagnosis not present

## 2021-03-10 DIAGNOSIS — S34103A Unspecified injury to L3 level of lumbar spinal cord, initial encounter: Secondary | ICD-10-CM | POA: Diagnosis not present

## 2021-03-10 DIAGNOSIS — K9189 Other postprocedural complications and disorders of digestive system: Secondary | ICD-10-CM | POA: Diagnosis not present

## 2021-03-10 DIAGNOSIS — Y9279 Other farm location as the place of occurrence of the external cause: Secondary | ICD-10-CM | POA: Insufficient documentation

## 2021-03-10 DIAGNOSIS — G9609 Other spinal cerebrospinal fluid leak: Secondary | ICD-10-CM | POA: Diagnosis not present

## 2021-03-11 DIAGNOSIS — S34103A Unspecified injury to L3 level of lumbar spinal cord, initial encounter: Secondary | ICD-10-CM | POA: Diagnosis present

## 2021-03-11 DIAGNOSIS — I509 Heart failure, unspecified: Secondary | ICD-10-CM | POA: Diagnosis present

## 2021-03-11 DIAGNOSIS — G8929 Other chronic pain: Secondary | ICD-10-CM | POA: Diagnosis present

## 2021-03-11 DIAGNOSIS — K9189 Other postprocedural complications and disorders of digestive system: Secondary | ICD-10-CM | POA: Diagnosis not present

## 2021-03-11 DIAGNOSIS — S22089A Unspecified fracture of T11-T12 vertebra, initial encounter for closed fracture: Secondary | ICD-10-CM | POA: Diagnosis present

## 2021-03-11 DIAGNOSIS — I2510 Atherosclerotic heart disease of native coronary artery without angina pectoris: Secondary | ICD-10-CM | POA: Diagnosis present

## 2021-03-11 DIAGNOSIS — Y9279 Other farm location as the place of occurrence of the external cause: Secondary | ICD-10-CM | POA: Diagnosis not present

## 2021-03-11 DIAGNOSIS — Z4659 Encounter for fitting and adjustment of other gastrointestinal appliance and device: Secondary | ICD-10-CM | POA: Diagnosis not present

## 2021-03-11 DIAGNOSIS — J8417 Interstitial lung disease with progressive fibrotic phenotype in diseases classified elsewhere: Secondary | ICD-10-CM | POA: Diagnosis not present

## 2021-03-11 DIAGNOSIS — G8911 Acute pain due to trauma: Secondary | ICD-10-CM | POA: Diagnosis not present

## 2021-03-11 DIAGNOSIS — M5459 Other low back pain: Secondary | ICD-10-CM | POA: Diagnosis not present

## 2021-03-11 DIAGNOSIS — S32032A Unstable burst fracture of third lumbar vertebra, initial encounter for closed fracture: Secondary | ICD-10-CM | POA: Diagnosis not present

## 2021-03-11 DIAGNOSIS — I110 Hypertensive heart disease with heart failure: Secondary | ICD-10-CM | POA: Diagnosis present

## 2021-03-11 DIAGNOSIS — W3081XA Contact with agricultural transport vehicle in stationary use, initial encounter: Secondary | ICD-10-CM | POA: Diagnosis not present

## 2021-03-11 DIAGNOSIS — Z9981 Dependence on supplemental oxygen: Secondary | ICD-10-CM | POA: Diagnosis not present

## 2021-03-11 DIAGNOSIS — M545 Low back pain, unspecified: Secondary | ICD-10-CM | POA: Diagnosis not present

## 2021-03-11 DIAGNOSIS — J90 Pleural effusion, not elsewhere classified: Secondary | ICD-10-CM | POA: Diagnosis not present

## 2021-03-11 DIAGNOSIS — Y999 Unspecified external cause status: Secondary | ICD-10-CM | POA: Diagnosis not present

## 2021-03-11 DIAGNOSIS — G8918 Other acute postprocedural pain: Secondary | ICD-10-CM | POA: Diagnosis not present

## 2021-03-11 DIAGNOSIS — S34122A Incomplete lesion of L2 level of lumbar spinal cord, initial encounter: Secondary | ICD-10-CM | POA: Diagnosis not present

## 2021-03-11 DIAGNOSIS — Y9389 Activity, other specified: Secondary | ICD-10-CM | POA: Diagnosis not present

## 2021-03-11 DIAGNOSIS — F17210 Nicotine dependence, cigarettes, uncomplicated: Secondary | ICD-10-CM | POA: Diagnosis not present

## 2021-03-11 DIAGNOSIS — Z79891 Long term (current) use of opiate analgesic: Secondary | ICD-10-CM | POA: Diagnosis not present

## 2021-03-11 DIAGNOSIS — F17200 Nicotine dependence, unspecified, uncomplicated: Secondary | ICD-10-CM | POA: Diagnosis not present

## 2021-03-11 DIAGNOSIS — K567 Ileus, unspecified: Secondary | ICD-10-CM | POA: Diagnosis not present

## 2021-03-11 DIAGNOSIS — S32031A Stable burst fracture of third lumbar vertebra, initial encounter for closed fracture: Secondary | ICD-10-CM | POA: Diagnosis present

## 2021-03-11 DIAGNOSIS — S32039A Unspecified fracture of third lumbar vertebra, initial encounter for closed fracture: Secondary | ICD-10-CM | POA: Diagnosis not present

## 2021-03-11 DIAGNOSIS — K6389 Other specified diseases of intestine: Secondary | ICD-10-CM | POA: Diagnosis not present

## 2021-03-11 DIAGNOSIS — R1084 Generalized abdominal pain: Secondary | ICD-10-CM | POA: Diagnosis not present

## 2021-03-11 DIAGNOSIS — G9609 Other spinal cerebrospinal fluid leak: Secondary | ICD-10-CM | POA: Diagnosis present

## 2021-03-11 DIAGNOSIS — R140 Abdominal distension (gaseous): Secondary | ICD-10-CM | POA: Diagnosis not present

## 2021-03-11 DIAGNOSIS — Z20822 Contact with and (suspected) exposure to covid-19: Secondary | ICD-10-CM | POA: Diagnosis present

## 2021-03-11 DIAGNOSIS — J449 Chronic obstructive pulmonary disease, unspecified: Secondary | ICD-10-CM | POA: Diagnosis present

## 2021-03-19 DIAGNOSIS — S22089D Unspecified fracture of T11-T12 vertebra, subsequent encounter for fracture with routine healing: Secondary | ICD-10-CM | POA: Diagnosis not present

## 2021-03-19 DIAGNOSIS — K567 Ileus, unspecified: Secondary | ICD-10-CM | POA: Diagnosis not present

## 2021-03-19 DIAGNOSIS — K5900 Constipation, unspecified: Secondary | ICD-10-CM | POA: Diagnosis not present

## 2021-03-19 DIAGNOSIS — I2510 Atherosclerotic heart disease of native coronary artery without angina pectoris: Secondary | ICD-10-CM | POA: Diagnosis not present

## 2021-03-19 DIAGNOSIS — Z981 Arthrodesis status: Secondary | ICD-10-CM | POA: Diagnosis not present

## 2021-03-19 DIAGNOSIS — G8929 Other chronic pain: Secondary | ICD-10-CM | POA: Diagnosis not present

## 2021-03-19 DIAGNOSIS — Z7409 Other reduced mobility: Secondary | ICD-10-CM | POA: Insufficient documentation

## 2021-03-19 DIAGNOSIS — S32038D Other fracture of third lumbar vertebra, subsequent encounter for fracture with routine healing: Secondary | ICD-10-CM | POA: Diagnosis not present

## 2021-03-19 DIAGNOSIS — J449 Chronic obstructive pulmonary disease, unspecified: Secondary | ICD-10-CM | POA: Diagnosis not present

## 2021-03-19 DIAGNOSIS — M549 Dorsalgia, unspecified: Secondary | ICD-10-CM | POA: Diagnosis not present

## 2021-03-19 DIAGNOSIS — R918 Other nonspecific abnormal finding of lung field: Secondary | ICD-10-CM | POA: Diagnosis not present

## 2021-03-19 DIAGNOSIS — Y9279 Other farm location as the place of occurrence of the external cause: Secondary | ICD-10-CM | POA: Diagnosis not present

## 2021-03-19 DIAGNOSIS — Z8719 Personal history of other diseases of the digestive system: Secondary | ICD-10-CM | POA: Diagnosis not present

## 2021-03-19 DIAGNOSIS — J9601 Acute respiratory failure with hypoxia: Secondary | ICD-10-CM | POA: Diagnosis not present

## 2021-03-19 DIAGNOSIS — Z789 Other specified health status: Secondary | ICD-10-CM | POA: Insufficient documentation

## 2021-03-19 DIAGNOSIS — S32039D Unspecified fracture of third lumbar vertebra, subsequent encounter for fracture with routine healing: Secondary | ICD-10-CM | POA: Diagnosis not present

## 2021-03-19 DIAGNOSIS — Z741 Need for assistance with personal care: Secondary | ICD-10-CM | POA: Diagnosis not present

## 2021-03-19 DIAGNOSIS — Z7951 Long term (current) use of inhaled steroids: Secondary | ICD-10-CM | POA: Diagnosis not present

## 2021-03-19 DIAGNOSIS — R4182 Altered mental status, unspecified: Secondary | ICD-10-CM | POA: Diagnosis not present

## 2021-03-19 DIAGNOSIS — A419 Sepsis, unspecified organism: Secondary | ICD-10-CM | POA: Diagnosis not present

## 2021-03-19 DIAGNOSIS — S32000A Wedge compression fracture of unspecified lumbar vertebra, initial encounter for closed fracture: Secondary | ICD-10-CM | POA: Insufficient documentation

## 2021-03-19 DIAGNOSIS — K5903 Drug induced constipation: Secondary | ICD-10-CM | POA: Insufficient documentation

## 2021-03-19 DIAGNOSIS — Z8669 Personal history of other diseases of the nervous system and sense organs: Secondary | ICD-10-CM | POA: Diagnosis not present

## 2021-03-19 DIAGNOSIS — Z9981 Dependence on supplemental oxygen: Secondary | ICD-10-CM | POA: Diagnosis not present

## 2021-03-19 DIAGNOSIS — Z7952 Long term (current) use of systemic steroids: Secondary | ICD-10-CM | POA: Diagnosis not present

## 2021-03-19 DIAGNOSIS — I491 Atrial premature depolarization: Secondary | ICD-10-CM | POA: Diagnosis not present

## 2021-03-19 DIAGNOSIS — S34103D Unspecified injury to L3 level of lumbar spinal cord, subsequent encounter: Secondary | ICD-10-CM | POA: Diagnosis not present

## 2021-03-19 DIAGNOSIS — R6520 Severe sepsis without septic shock: Secondary | ICD-10-CM | POA: Diagnosis not present

## 2021-03-19 DIAGNOSIS — F17210 Nicotine dependence, cigarettes, uncomplicated: Secondary | ICD-10-CM | POA: Diagnosis not present

## 2021-03-26 DIAGNOSIS — Z4659 Encounter for fitting and adjustment of other gastrointestinal appliance and device: Secondary | ICD-10-CM | POA: Diagnosis not present

## 2021-03-26 DIAGNOSIS — J9601 Acute respiratory failure with hypoxia: Secondary | ICD-10-CM | POA: Diagnosis not present

## 2021-03-26 DIAGNOSIS — J9621 Acute and chronic respiratory failure with hypoxia: Secondary | ICD-10-CM | POA: Diagnosis not present

## 2021-03-26 DIAGNOSIS — A419 Sepsis, unspecified organism: Secondary | ICD-10-CM | POA: Diagnosis not present

## 2021-03-26 DIAGNOSIS — R6520 Severe sepsis without septic shock: Secondary | ICD-10-CM | POA: Diagnosis not present

## 2021-03-26 DIAGNOSIS — R140 Abdominal distension (gaseous): Secondary | ICD-10-CM | POA: Diagnosis not present

## 2021-03-27 DIAGNOSIS — N2889 Other specified disorders of kidney and ureter: Secondary | ICD-10-CM | POA: Diagnosis not present

## 2021-03-27 DIAGNOSIS — R6520 Severe sepsis without septic shock: Secondary | ICD-10-CM | POA: Diagnosis not present

## 2021-03-27 DIAGNOSIS — R0602 Shortness of breath: Secondary | ICD-10-CM | POA: Diagnosis not present

## 2021-03-27 DIAGNOSIS — A419 Sepsis, unspecified organism: Secondary | ICD-10-CM | POA: Diagnosis not present

## 2021-03-28 DIAGNOSIS — R400 Somnolence: Secondary | ICD-10-CM | POA: Diagnosis not present

## 2021-03-28 DIAGNOSIS — G9600 Cerebrospinal fluid leak, unspecified: Secondary | ICD-10-CM | POA: Diagnosis not present

## 2021-03-28 DIAGNOSIS — G009 Bacterial meningitis, unspecified: Secondary | ICD-10-CM | POA: Diagnosis not present

## 2021-03-28 DIAGNOSIS — G919 Hydrocephalus, unspecified: Secondary | ICD-10-CM | POA: Diagnosis not present

## 2021-03-28 DIAGNOSIS — S32030D Wedge compression fracture of third lumbar vertebra, subsequent encounter for fracture with routine healing: Secondary | ICD-10-CM | POA: Diagnosis not present

## 2021-03-28 DIAGNOSIS — T8141XA Infection following a procedure, superficial incisional surgical site, initial encounter: Secondary | ICD-10-CM | POA: Diagnosis not present

## 2021-03-28 DIAGNOSIS — A419 Sepsis, unspecified organism: Secondary | ICD-10-CM | POA: Diagnosis not present

## 2021-03-28 DIAGNOSIS — S32030A Wedge compression fracture of third lumbar vertebra, initial encounter for closed fracture: Secondary | ICD-10-CM | POA: Diagnosis not present

## 2021-03-30 DIAGNOSIS — R937 Abnormal findings on diagnostic imaging of other parts of musculoskeletal system: Secondary | ICD-10-CM | POA: Diagnosis not present

## 2021-03-30 DIAGNOSIS — M545 Low back pain, unspecified: Secondary | ICD-10-CM | POA: Diagnosis not present

## 2021-03-30 DIAGNOSIS — R000 Tachycardia, unspecified: Secondary | ICD-10-CM | POA: Diagnosis not present

## 2021-03-30 DIAGNOSIS — R519 Headache, unspecified: Secondary | ICD-10-CM | POA: Diagnosis not present

## 2021-03-30 DIAGNOSIS — Z9689 Presence of other specified functional implants: Secondary | ICD-10-CM | POA: Diagnosis not present

## 2021-03-30 DIAGNOSIS — M5080 Other cervical disc disorders, unspecified cervical region: Secondary | ICD-10-CM | POA: Diagnosis not present

## 2021-03-30 DIAGNOSIS — R5383 Other fatigue: Secondary | ICD-10-CM | POA: Diagnosis not present

## 2021-03-30 DIAGNOSIS — T8131XA Disruption of external operation (surgical) wound, not elsewhere classified, initial encounter: Secondary | ICD-10-CM | POA: Diagnosis not present

## 2021-03-30 DIAGNOSIS — G038 Meningitis due to other specified causes: Secondary | ICD-10-CM | POA: Diagnosis not present

## 2021-03-31 DIAGNOSIS — R937 Abnormal findings on diagnostic imaging of other parts of musculoskeletal system: Secondary | ICD-10-CM | POA: Diagnosis not present

## 2021-03-31 DIAGNOSIS — M545 Low back pain, unspecified: Secondary | ICD-10-CM | POA: Diagnosis not present

## 2021-03-31 DIAGNOSIS — R5383 Other fatigue: Secondary | ICD-10-CM | POA: Diagnosis not present

## 2021-03-31 DIAGNOSIS — G038 Meningitis due to other specified causes: Secondary | ICD-10-CM | POA: Diagnosis not present

## 2021-03-31 DIAGNOSIS — T8131XA Disruption of external operation (surgical) wound, not elsewhere classified, initial encounter: Secondary | ICD-10-CM | POA: Diagnosis not present

## 2021-03-31 DIAGNOSIS — Z9689 Presence of other specified functional implants: Secondary | ICD-10-CM | POA: Diagnosis not present

## 2021-03-31 DIAGNOSIS — M869 Osteomyelitis, unspecified: Secondary | ICD-10-CM | POA: Diagnosis not present

## 2021-03-31 DIAGNOSIS — R519 Headache, unspecified: Secondary | ICD-10-CM | POA: Diagnosis not present

## 2021-03-31 DIAGNOSIS — M5080 Other cervical disc disorders, unspecified cervical region: Secondary | ICD-10-CM | POA: Diagnosis not present

## 2021-03-31 DIAGNOSIS — G009 Bacterial meningitis, unspecified: Secondary | ICD-10-CM | POA: Diagnosis not present

## 2021-03-31 DIAGNOSIS — R000 Tachycardia, unspecified: Secondary | ICD-10-CM | POA: Diagnosis not present

## 2021-04-01 DIAGNOSIS — Z9689 Presence of other specified functional implants: Secondary | ICD-10-CM | POA: Diagnosis not present

## 2021-04-01 DIAGNOSIS — R937 Abnormal findings on diagnostic imaging of other parts of musculoskeletal system: Secondary | ICD-10-CM | POA: Diagnosis not present

## 2021-04-01 DIAGNOSIS — R000 Tachycardia, unspecified: Secondary | ICD-10-CM | POA: Diagnosis not present

## 2021-04-01 DIAGNOSIS — R4182 Altered mental status, unspecified: Secondary | ICD-10-CM | POA: Diagnosis not present

## 2021-04-01 DIAGNOSIS — G038 Meningitis due to other specified causes: Secondary | ICD-10-CM | POA: Diagnosis not present

## 2021-04-01 DIAGNOSIS — M545 Low back pain, unspecified: Secondary | ICD-10-CM | POA: Diagnosis not present

## 2021-04-01 DIAGNOSIS — R836 Abnormal cytological findings in cerebrospinal fluid: Secondary | ICD-10-CM | POA: Diagnosis not present

## 2021-04-01 DIAGNOSIS — R519 Headache, unspecified: Secondary | ICD-10-CM | POA: Diagnosis not present

## 2021-04-01 DIAGNOSIS — R5383 Other fatigue: Secondary | ICD-10-CM | POA: Diagnosis not present

## 2021-04-01 DIAGNOSIS — T8131XA Disruption of external operation (surgical) wound, not elsewhere classified, initial encounter: Secondary | ICD-10-CM | POA: Diagnosis not present

## 2021-04-01 DIAGNOSIS — M5080 Other cervical disc disorders, unspecified cervical region: Secondary | ICD-10-CM | POA: Diagnosis not present

## 2021-04-02 DIAGNOSIS — G9341 Metabolic encephalopathy: Secondary | ICD-10-CM | POA: Diagnosis not present

## 2021-04-02 DIAGNOSIS — L89522 Pressure ulcer of left ankle, stage 2: Secondary | ICD-10-CM | POA: Diagnosis not present

## 2021-04-02 DIAGNOSIS — G038 Meningitis due to other specified causes: Secondary | ICD-10-CM | POA: Diagnosis not present

## 2021-04-02 DIAGNOSIS — Z9889 Other specified postprocedural states: Secondary | ICD-10-CM | POA: Diagnosis not present

## 2021-04-02 DIAGNOSIS — T8131XA Disruption of external operation (surgical) wound, not elsewhere classified, initial encounter: Secondary | ICD-10-CM | POA: Diagnosis not present

## 2021-04-02 DIAGNOSIS — G009 Bacterial meningitis, unspecified: Secondary | ICD-10-CM | POA: Diagnosis not present

## 2021-04-02 DIAGNOSIS — J690 Pneumonitis due to inhalation of food and vomit: Secondary | ICD-10-CM | POA: Diagnosis not present

## 2021-04-02 DIAGNOSIS — S300XXA Contusion of lower back and pelvis, initial encounter: Secondary | ICD-10-CM | POA: Diagnosis not present

## 2021-04-02 DIAGNOSIS — Z9689 Presence of other specified functional implants: Secondary | ICD-10-CM | POA: Diagnosis not present

## 2021-04-02 DIAGNOSIS — J9621 Acute and chronic respiratory failure with hypoxia: Secondary | ICD-10-CM | POA: Diagnosis not present

## 2021-04-03 DIAGNOSIS — G9341 Metabolic encephalopathy: Secondary | ICD-10-CM | POA: Diagnosis not present

## 2021-04-03 DIAGNOSIS — L89522 Pressure ulcer of left ankle, stage 2: Secondary | ICD-10-CM | POA: Diagnosis not present

## 2021-04-03 DIAGNOSIS — G038 Meningitis due to other specified causes: Secondary | ICD-10-CM | POA: Diagnosis not present

## 2021-04-03 DIAGNOSIS — S300XXA Contusion of lower back and pelvis, initial encounter: Secondary | ICD-10-CM | POA: Diagnosis not present

## 2021-04-03 DIAGNOSIS — J9621 Acute and chronic respiratory failure with hypoxia: Secondary | ICD-10-CM | POA: Diagnosis not present

## 2021-04-03 DIAGNOSIS — G009 Bacterial meningitis, unspecified: Secondary | ICD-10-CM | POA: Diagnosis not present

## 2021-04-03 DIAGNOSIS — J690 Pneumonitis due to inhalation of food and vomit: Secondary | ICD-10-CM | POA: Diagnosis not present

## 2021-04-03 DIAGNOSIS — T8131XA Disruption of external operation (surgical) wound, not elsewhere classified, initial encounter: Secondary | ICD-10-CM | POA: Diagnosis not present

## 2021-04-03 DIAGNOSIS — Z9689 Presence of other specified functional implants: Secondary | ICD-10-CM | POA: Diagnosis not present

## 2021-04-03 DIAGNOSIS — Z9889 Other specified postprocedural states: Secondary | ICD-10-CM | POA: Diagnosis not present

## 2021-04-04 DIAGNOSIS — J9621 Acute and chronic respiratory failure with hypoxia: Secondary | ICD-10-CM | POA: Diagnosis not present

## 2021-04-04 DIAGNOSIS — G9341 Metabolic encephalopathy: Secondary | ICD-10-CM | POA: Diagnosis not present

## 2021-04-04 DIAGNOSIS — J690 Pneumonitis due to inhalation of food and vomit: Secondary | ICD-10-CM | POA: Diagnosis not present

## 2021-04-04 DIAGNOSIS — Z9689 Presence of other specified functional implants: Secondary | ICD-10-CM | POA: Diagnosis not present

## 2021-04-04 DIAGNOSIS — Z9889 Other specified postprocedural states: Secondary | ICD-10-CM | POA: Diagnosis not present

## 2021-04-04 DIAGNOSIS — G038 Meningitis due to other specified causes: Secondary | ICD-10-CM | POA: Diagnosis not present

## 2021-04-04 DIAGNOSIS — G009 Bacterial meningitis, unspecified: Secondary | ICD-10-CM | POA: Diagnosis not present

## 2021-04-04 DIAGNOSIS — L89522 Pressure ulcer of left ankle, stage 2: Secondary | ICD-10-CM | POA: Diagnosis not present

## 2021-04-04 DIAGNOSIS — S300XXA Contusion of lower back and pelvis, initial encounter: Secondary | ICD-10-CM | POA: Diagnosis not present

## 2021-04-05 DIAGNOSIS — S300XXA Contusion of lower back and pelvis, initial encounter: Secondary | ICD-10-CM | POA: Diagnosis not present

## 2021-04-05 DIAGNOSIS — G9341 Metabolic encephalopathy: Secondary | ICD-10-CM | POA: Diagnosis not present

## 2021-04-05 DIAGNOSIS — J690 Pneumonitis due to inhalation of food and vomit: Secondary | ICD-10-CM | POA: Diagnosis not present

## 2021-04-05 DIAGNOSIS — Z9889 Other specified postprocedural states: Secondary | ICD-10-CM | POA: Diagnosis not present

## 2021-04-05 DIAGNOSIS — J9621 Acute and chronic respiratory failure with hypoxia: Secondary | ICD-10-CM | POA: Diagnosis not present

## 2021-04-05 DIAGNOSIS — T8189XA Other complications of procedures, not elsewhere classified, initial encounter: Secondary | ICD-10-CM | POA: Diagnosis not present

## 2021-04-05 DIAGNOSIS — L89522 Pressure ulcer of left ankle, stage 2: Secondary | ICD-10-CM | POA: Diagnosis not present

## 2021-04-05 DIAGNOSIS — G038 Meningitis due to other specified causes: Secondary | ICD-10-CM | POA: Diagnosis not present

## 2021-04-06 DIAGNOSIS — G038 Meningitis due to other specified causes: Secondary | ICD-10-CM | POA: Diagnosis not present

## 2021-04-06 DIAGNOSIS — Z9889 Other specified postprocedural states: Secondary | ICD-10-CM | POA: Diagnosis not present

## 2021-04-06 DIAGNOSIS — J690 Pneumonitis due to inhalation of food and vomit: Secondary | ICD-10-CM | POA: Diagnosis not present

## 2021-04-06 DIAGNOSIS — G9341 Metabolic encephalopathy: Secondary | ICD-10-CM | POA: Diagnosis not present

## 2021-04-06 DIAGNOSIS — J9621 Acute and chronic respiratory failure with hypoxia: Secondary | ICD-10-CM | POA: Diagnosis not present

## 2021-04-06 DIAGNOSIS — S300XXA Contusion of lower back and pelvis, initial encounter: Secondary | ICD-10-CM | POA: Diagnosis not present

## 2021-04-06 DIAGNOSIS — T8189XA Other complications of procedures, not elsewhere classified, initial encounter: Secondary | ICD-10-CM | POA: Diagnosis not present

## 2021-04-06 DIAGNOSIS — L89522 Pressure ulcer of left ankle, stage 2: Secondary | ICD-10-CM | POA: Diagnosis not present

## 2021-04-07 DIAGNOSIS — Z9889 Other specified postprocedural states: Secondary | ICD-10-CM | POA: Diagnosis not present

## 2021-04-07 DIAGNOSIS — S300XXA Contusion of lower back and pelvis, initial encounter: Secondary | ICD-10-CM | POA: Diagnosis not present

## 2021-04-07 DIAGNOSIS — J9621 Acute and chronic respiratory failure with hypoxia: Secondary | ICD-10-CM | POA: Diagnosis not present

## 2021-04-07 DIAGNOSIS — G038 Meningitis due to other specified causes: Secondary | ICD-10-CM | POA: Diagnosis not present

## 2021-04-07 DIAGNOSIS — T8189XA Other complications of procedures, not elsewhere classified, initial encounter: Secondary | ICD-10-CM | POA: Diagnosis not present

## 2021-04-07 DIAGNOSIS — L89522 Pressure ulcer of left ankle, stage 2: Secondary | ICD-10-CM | POA: Diagnosis not present

## 2021-04-07 DIAGNOSIS — J690 Pneumonitis due to inhalation of food and vomit: Secondary | ICD-10-CM | POA: Diagnosis not present

## 2021-04-08 DIAGNOSIS — J690 Pneumonitis due to inhalation of food and vomit: Secondary | ICD-10-CM | POA: Diagnosis not present

## 2021-04-08 DIAGNOSIS — J9621 Acute and chronic respiratory failure with hypoxia: Secondary | ICD-10-CM | POA: Diagnosis not present

## 2021-04-08 DIAGNOSIS — L89522 Pressure ulcer of left ankle, stage 2: Secondary | ICD-10-CM | POA: Diagnosis not present

## 2021-04-08 DIAGNOSIS — T8189XA Other complications of procedures, not elsewhere classified, initial encounter: Secondary | ICD-10-CM | POA: Diagnosis not present

## 2021-04-08 DIAGNOSIS — Z9889 Other specified postprocedural states: Secondary | ICD-10-CM | POA: Diagnosis not present

## 2021-04-08 DIAGNOSIS — S300XXA Contusion of lower back and pelvis, initial encounter: Secondary | ICD-10-CM | POA: Diagnosis not present

## 2021-04-08 DIAGNOSIS — G038 Meningitis due to other specified causes: Secondary | ICD-10-CM | POA: Diagnosis not present

## 2021-04-09 DIAGNOSIS — T50905D Adverse effect of unspecified drugs, medicaments and biological substances, subsequent encounter: Secondary | ICD-10-CM | POA: Diagnosis not present

## 2021-04-09 DIAGNOSIS — T8463XA Infection and inflammatory reaction due to internal fixation device of spine, initial encounter: Secondary | ICD-10-CM | POA: Diagnosis not present

## 2021-04-09 DIAGNOSIS — S22089D Unspecified fracture of T11-T12 vertebra, subsequent encounter for fracture with routine healing: Secondary | ICD-10-CM | POA: Diagnosis not present

## 2021-04-09 DIAGNOSIS — G8929 Other chronic pain: Secondary | ICD-10-CM | POA: Diagnosis not present

## 2021-04-09 DIAGNOSIS — J8410 Pulmonary fibrosis, unspecified: Secondary | ICD-10-CM | POA: Diagnosis not present

## 2021-04-09 DIAGNOSIS — Z9181 History of falling: Secondary | ICD-10-CM | POA: Diagnosis not present

## 2021-04-09 DIAGNOSIS — J440 Chronic obstructive pulmonary disease with acute lower respiratory infection: Secondary | ICD-10-CM | POA: Diagnosis not present

## 2021-04-09 DIAGNOSIS — Z79891 Long term (current) use of opiate analgesic: Secondary | ICD-10-CM | POA: Diagnosis not present

## 2021-04-09 DIAGNOSIS — K5903 Drug induced constipation: Secondary | ICD-10-CM | POA: Diagnosis not present

## 2021-04-09 DIAGNOSIS — W19XXXD Unspecified fall, subsequent encounter: Secondary | ICD-10-CM | POA: Diagnosis not present

## 2021-04-09 DIAGNOSIS — L89522 Pressure ulcer of left ankle, stage 2: Secondary | ICD-10-CM | POA: Diagnosis not present

## 2021-04-09 DIAGNOSIS — J189 Pneumonia, unspecified organism: Secondary | ICD-10-CM | POA: Diagnosis not present

## 2021-04-09 DIAGNOSIS — F17210 Nicotine dependence, cigarettes, uncomplicated: Secondary | ICD-10-CM | POA: Diagnosis not present

## 2021-04-09 DIAGNOSIS — I2510 Atherosclerotic heart disease of native coronary artery without angina pectoris: Secondary | ICD-10-CM | POA: Diagnosis not present

## 2021-04-09 DIAGNOSIS — Z452 Encounter for adjustment and management of vascular access device: Secondary | ICD-10-CM | POA: Diagnosis not present

## 2021-04-09 DIAGNOSIS — G039 Meningitis, unspecified: Secondary | ICD-10-CM | POA: Diagnosis not present

## 2021-04-09 DIAGNOSIS — Z5181 Encounter for therapeutic drug level monitoring: Secondary | ICD-10-CM | POA: Diagnosis not present

## 2021-04-09 DIAGNOSIS — J9621 Acute and chronic respiratory failure with hypoxia: Secondary | ICD-10-CM | POA: Diagnosis not present

## 2021-04-09 DIAGNOSIS — S32031D Stable burst fracture of third lumbar vertebra, subsequent encounter for fracture with routine healing: Secondary | ICD-10-CM | POA: Diagnosis not present

## 2021-04-09 DIAGNOSIS — T8142XA Infection following a procedure, deep incisional surgical site, initial encounter: Secondary | ICD-10-CM | POA: Diagnosis not present

## 2021-04-09 DIAGNOSIS — Z981 Arthrodesis status: Secondary | ICD-10-CM | POA: Diagnosis not present

## 2021-04-09 DIAGNOSIS — Z792 Long term (current) use of antibiotics: Secondary | ICD-10-CM | POA: Diagnosis not present

## 2021-04-09 DIAGNOSIS — M4626 Osteomyelitis of vertebra, lumbar region: Secondary | ICD-10-CM | POA: Diagnosis not present

## 2021-04-10 DIAGNOSIS — S32031D Stable burst fracture of third lumbar vertebra, subsequent encounter for fracture with routine healing: Secondary | ICD-10-CM | POA: Diagnosis not present

## 2021-04-10 DIAGNOSIS — S22089D Unspecified fracture of T11-T12 vertebra, subsequent encounter for fracture with routine healing: Secondary | ICD-10-CM | POA: Diagnosis not present

## 2021-04-10 DIAGNOSIS — G039 Meningitis, unspecified: Secondary | ICD-10-CM | POA: Diagnosis not present

## 2021-04-10 DIAGNOSIS — T8142XA Infection following a procedure, deep incisional surgical site, initial encounter: Secondary | ICD-10-CM | POA: Diagnosis not present

## 2021-04-10 DIAGNOSIS — T8463XA Infection and inflammatory reaction due to internal fixation device of spine, initial encounter: Secondary | ICD-10-CM | POA: Diagnosis not present

## 2021-04-10 DIAGNOSIS — M4626 Osteomyelitis of vertebra, lumbar region: Secondary | ICD-10-CM | POA: Diagnosis not present

## 2021-04-13 ENCOUNTER — Emergency Department (HOSPITAL_COMMUNITY)
Admission: EM | Admit: 2021-04-13 | Discharge: 2021-04-13 | Disposition: A | Discharge status: Home / Self Care | Payer: Medicare Other | Attending: Emergency Medicine | Admitting: Emergency Medicine

## 2021-04-13 ENCOUNTER — Encounter (HOSPITAL_COMMUNITY): Payer: Self-pay | Admitting: *Deleted

## 2021-04-13 ENCOUNTER — Other Ambulatory Visit: Payer: Self-pay

## 2021-04-13 ENCOUNTER — Emergency Department (HOSPITAL_COMMUNITY): Payer: Medicare Other

## 2021-04-13 ENCOUNTER — Other Ambulatory Visit (HOSPITAL_COMMUNITY)
Admission: RE | Admit: 2021-04-13 | Discharge: 2021-04-13 | Disposition: A | Discharge status: Home / Self Care | Payer: Medicare Other | Attending: Adult Health | Admitting: Adult Health

## 2021-04-13 DIAGNOSIS — G8929 Other chronic pain: Secondary | ICD-10-CM | POA: Diagnosis not present

## 2021-04-13 DIAGNOSIS — I5031 Acute diastolic (congestive) heart failure: Secondary | ICD-10-CM | POA: Insufficient documentation

## 2021-04-13 DIAGNOSIS — G039 Meningitis, unspecified: Secondary | ICD-10-CM | POA: Diagnosis not present

## 2021-04-13 DIAGNOSIS — S22089D Unspecified fracture of T11-T12 vertebra, subsequent encounter for fracture with routine healing: Secondary | ICD-10-CM | POA: Diagnosis not present

## 2021-04-13 DIAGNOSIS — Z20822 Contact with and (suspected) exposure to covid-19: Secondary | ICD-10-CM | POA: Diagnosis not present

## 2021-04-13 DIAGNOSIS — F17210 Nicotine dependence, cigarettes, uncomplicated: Secondary | ICD-10-CM | POA: Diagnosis not present

## 2021-04-13 DIAGNOSIS — M4626 Osteomyelitis of vertebra, lumbar region: Secondary | ICD-10-CM | POA: Diagnosis not present

## 2021-04-13 DIAGNOSIS — T3691XA Poisoning by unspecified systemic antibiotic, accidental (unintentional), initial encounter: Secondary | ICD-10-CM | POA: Diagnosis not present

## 2021-04-13 DIAGNOSIS — A419 Sepsis, unspecified organism: Secondary | ICD-10-CM | POA: Insufficient documentation

## 2021-04-13 DIAGNOSIS — S32031D Stable burst fracture of third lumbar vertebra, subsequent encounter for fracture with routine healing: Secondary | ICD-10-CM | POA: Diagnosis not present

## 2021-04-13 DIAGNOSIS — T50Z91A Poisoning by other vaccines and biological substances, accidental (unintentional), initial encounter: Secondary | ICD-10-CM | POA: Diagnosis not present

## 2021-04-13 DIAGNOSIS — M549 Dorsalgia, unspecified: Secondary | ICD-10-CM | POA: Insufficient documentation

## 2021-04-13 DIAGNOSIS — T8463XA Infection and inflammatory reaction due to internal fixation device of spine, initial encounter: Secondary | ICD-10-CM | POA: Diagnosis not present

## 2021-04-13 DIAGNOSIS — T368X1A Poisoning by other systemic antibiotics, accidental (unintentional), initial encounter: Secondary | ICD-10-CM

## 2021-04-13 DIAGNOSIS — R799 Abnormal finding of blood chemistry, unspecified: Secondary | ICD-10-CM | POA: Diagnosis present

## 2021-04-13 DIAGNOSIS — J449 Chronic obstructive pulmonary disease, unspecified: Secondary | ICD-10-CM | POA: Insufficient documentation

## 2021-04-13 DIAGNOSIS — R9431 Abnormal electrocardiogram [ECG] [EKG]: Secondary | ICD-10-CM | POA: Diagnosis not present

## 2021-04-13 DIAGNOSIS — T8142XA Infection following a procedure, deep incisional surgical site, initial encounter: Secondary | ICD-10-CM | POA: Diagnosis not present

## 2021-04-13 DIAGNOSIS — R0602 Shortness of breath: Secondary | ICD-10-CM

## 2021-04-13 LAB — COMPREHENSIVE METABOLIC PANEL
ALT: 9 U/L (ref 0–44)
ALT: 8 U/L (ref 0–44)
AST: 15 U/L (ref 15–41)
AST: 13 U/L — ABNORMAL LOW (ref 15–41)
Albumin: 2.5 g/dL — ABNORMAL LOW (ref 3.5–5.0)
Albumin: 2.6 g/dL — ABNORMAL LOW (ref 3.5–5.0)
Alkaline Phosphatase: 89 U/L (ref 38–126)
Alkaline Phosphatase: 90 U/L (ref 38–126)
Anion gap: 8 (ref 5–15)
Anion gap: 8 (ref 5–15)
BUN: 19 mg/dL (ref 8–23)
BUN: 21 mg/dL (ref 8–23)
CO2: 28 mmol/L (ref 22–32)
CO2: 28 mmol/L (ref 22–32)
Calcium: 8.8 mg/dL — ABNORMAL LOW (ref 8.9–10.3)
Calcium: 8.9 mg/dL (ref 8.9–10.3)
Chloride: 100 mmol/L (ref 98–111)
Chloride: 101 mmol/L (ref 98–111)
Creatinine, Ser: 1.55 mg/dL — ABNORMAL HIGH (ref 0.61–1.24)
Creatinine, Ser: 1.63 mg/dL — ABNORMAL HIGH (ref 0.61–1.24)
GFR, Estimated: 49 mL/min — ABNORMAL LOW (ref >60)
GFR, Estimated: 46 mL/min — ABNORMAL LOW (ref >60)
Glucose, Bld: 114 mg/dL — ABNORMAL HIGH (ref 70–99)
Glucose, Bld: 96 mg/dL (ref 70–99)
Potassium: 3.9 mmol/L (ref 3.5–5.1)
Potassium: 4.3 mmol/L (ref 3.5–5.1)
Sodium: 136 mmol/L (ref 135–145)
Sodium: 137 mmol/L (ref 135–145)
Total Bilirubin: 0.5 mg/dL (ref 0.3–1.2)
Total Bilirubin: 0.6 mg/dL (ref 0.3–1.2)
Total Protein: 5.9 g/dL — ABNORMAL LOW (ref 6.5–8.1)
Total Protein: 5.8 g/dL — ABNORMAL LOW (ref 6.5–8.1)

## 2021-04-13 LAB — URINALYSIS, ROUTINE W REFLEX MICROSCOPIC
Bacteria, UA: NONE SEEN
Bilirubin Urine: NEGATIVE
Glucose, UA: NEGATIVE mg/dL
Ketones, ur: 5 mg/dL — AB
Leukocytes,Ua: NEGATIVE
Nitrite: NEGATIVE
Protein, ur: 30 mg/dL — AB
Specific Gravity, Urine: 1.014 (ref 1.005–1.030)
pH: 5 (ref 5.0–8.0)

## 2021-04-13 LAB — CBC WITH DIFFERENTIAL/PLATELET
Abs Immature Granulocytes: 0.07 10*3/uL (ref 0.00–0.07)
Abs Immature Granulocytes: 0.08 10*3/uL — ABNORMAL HIGH (ref 0.00–0.07)
Basophils Absolute: 0.1 10*3/uL (ref 0.0–0.1)
Basophils Absolute: 0.1 10*3/uL (ref 0.0–0.1)
Basophils Relative: 1 %
Basophils Relative: 1 %
Eosinophils Absolute: 0.4 10*3/uL (ref 0.0–0.5)
Eosinophils Absolute: 0.2 10*3/uL (ref 0.0–0.5)
Eosinophils Relative: 3 %
Eosinophils Relative: 1 %
HCT: 33.8 % — ABNORMAL LOW (ref 39.0–52.0)
HCT: 32.5 % — ABNORMAL LOW (ref 39.0–52.0)
Hemoglobin: 11.1 g/dL — ABNORMAL LOW (ref 13.0–17.0)
Hemoglobin: 10.7 g/dL — ABNORMAL LOW (ref 13.0–17.0)
Immature Granulocytes: 1 %
Immature Granulocytes: 1 %
Lymphocytes Relative: 26 %
Lymphocytes Relative: 26 %
Lymphs Abs: 3.3 10*3/uL (ref 0.7–4.0)
Lymphs Abs: 2.9 10*3/uL (ref 0.7–4.0)
MCH: 32.8 pg (ref 26.0–34.0)
MCH: 32.5 pg (ref 26.0–34.0)
MCHC: 32.8 g/dL (ref 30.0–36.0)
MCHC: 32.9 g/dL (ref 30.0–36.0)
MCV: 100 fL (ref 80.0–100.0)
MCV: 98.8 fL (ref 80.0–100.0)
Monocytes Absolute: 2.1 10*3/uL — ABNORMAL HIGH (ref 0.1–1.0)
Monocytes Absolute: 1.7 10*3/uL — ABNORMAL HIGH (ref 0.1–1.0)
Monocytes Relative: 16 %
Monocytes Relative: 15 %
Neutro Abs: 6.6 10*3/uL (ref 1.7–7.7)
Neutro Abs: 6.1 10*3/uL (ref 1.7–7.7)
Neutrophils Relative %: 53 %
Neutrophils Relative %: 56 %
Platelets: 437 10*3/uL — ABNORMAL HIGH (ref 150–400)
Platelets: 399 10*3/uL (ref 150–400)
RBC: 3.38 MIL/uL — ABNORMAL LOW (ref 4.22–5.81)
RBC: 3.29 MIL/uL — ABNORMAL LOW (ref 4.22–5.81)
RDW: 14.2 % (ref 11.5–15.5)
RDW: 14 % (ref 11.5–15.5)
WBC: 12.6 10*3/uL — ABNORMAL HIGH (ref 4.0–10.5)
WBC: 11 10*3/uL — ABNORMAL HIGH (ref 4.0–10.5)
nRBC: 0 % (ref 0.0–0.2)
nRBC: 0 % (ref 0.0–0.2)

## 2021-04-13 LAB — VANCOMYCIN, RANDOM: Vancomycin Rm: 44

## 2021-04-13 LAB — VANCOMYCIN, TROUGH: Vancomycin Tr: 37 ug/mL (ref 15–20)

## 2021-04-13 LAB — C-REACTIVE PROTEIN: CRP: 0.6 mg/dL (ref <1.0)

## 2021-04-13 LAB — SEDIMENTATION RATE: Sed Rate: 71 mm/hr — ABNORMAL HIGH (ref 0–16)

## 2021-04-13 MED ORDER — SODIUM CHLORIDE 0.9 % IV BOLUS
1000.0000 mL | Freq: Once | INTRAVENOUS | Status: AC
  Administered 2021-04-13: 1000 mL via INTRAVENOUS

## 2021-04-13 NOTE — ED Triage Notes (Signed)
REFERRED TO THE  ED FOR EVALUATION. NOT SURE WHY HE WAS ASKED TO COME IN, PATIENT IS ON HOME ANTIBIOTICS

## 2021-04-13 NOTE — ED Notes (Signed)
Pt urine sample at bedside 

## 2021-04-13 NOTE — ED Notes (Signed)
Attempted to collect blood for testing from PICC line, red port will draw slowly with pts left hand behind his head but not enough for blood sampling, both ports flush well without difficulty.

## 2021-04-13 NOTE — Discharge Instructions (Signed)
Spoke with the pharmacist at [facility] who, based on the time that he received his vancomycin, and his current kidney function, recommends that he have a Vancomycin trough level, as well as creatinine labs repeated tomorrow morning.  Please call his surgeon, or whoever is prescribing his antibiotics to discuss these results.  Please call his surgeon if you have any further questions about his antibiotics.  Please do not administer any more vancomycin until your able to speak to whoever is prescribing this medication, and have repeat labs drawn.

## 2021-04-13 NOTE — ED Provider Notes (Signed)
[facility] Provider Note   CSN: [id number] Arrival date & time: 04/13/21  1543     History No chief complaint on file.   Jordan May is a 65 y.o. male with a past medical history significant for recent 30-day admission for back surgery with CNS versus lower extremity infection who was sent home on the 26th with a PICC line in place to receive vancomycin and 1 other antibiotic.  Patient was sent here via EMS with no instructions saying that one of his lab values was abnormal.  With some significant investigation it was found that patient's bank trough level this morning was 37 prior to being administered another dose of vancomycin.  Patient was told to discontinue his vancomycin at this time, and report to the emergency department.  Patient denies any symptoms, or complaints, wishes to go home at this time.  No pain, no treatment.  Patient did discontinue his vancomycin after being told to.  Has idiopathic pulmonary fibrosis, emphysema, lung issues at baseline.  HPI     Past Medical History:  Diagnosis Date   Arthritis    Chronic back pain    Emphysema/COPD (HCC)    Hypercholesterolemia    IPF (idiopathic pulmonary fibrosis) ([place])    Tobacco use     Patient Active Problem List   Diagnosis Date Noted   Leukocytosis 50/02/3817   Acute diastolic CHF (congestive heart failure) ([place]) 02/23/2018   Acute on chronic respiratory failure with hypoxia (HCC)    Malnutrition of moderate degree 02/20/2018   Pulmonary fibrosis ([place])    Hyperglycemia 02/19/2018   COPD with acute exacerbation ([place]) 02/19/2018   Elevated bilirubin 02/19/2018   Chronic back pain 02/19/2018   Hypercholesterolemia 02/19/2018   Tobacco use 02/19/2018   Hypomagnesemia 02/19/2018   Hypophosphatemia 02/19/2018   Sepsis due to undetermined organism ([place]) 02/19/2018   AKI (acute kidney injury) ([place]) 02/19/2018   Pleuritic chest pain 02/19/2018   Abnormal EKG 02/19/2018    Past Surgical  History:  Procedure Laterality Date   APPENDECTOMY     BACK SURGERY     NECK SURGERY         Family History  Problem Relation Age of Onset   Heart disease Mother        Pacemaker placement   Throat cancer Father    COPD Brother    Kidney disease Brother     Social History   Tobacco Use   Smoking status: Every Day    Packs/day: 1.00    Years: 50.00    Pack years: 50.00    Types: Cigarettes   Smokeless tobacco: Never  Vaping Use   Vaping Use: Never used  Substance Use Topics   Alcohol use: No   Drug use: No    Home Medications Prior to Admission medications   Medication Sig Start Date End Date Taking? Authorizing Provider  ceFEPIme (MAXIPIME) 1 GM/50ML SOLN Inject 6 g into the vein daily. Infuse 6 g into the vein daily. Infuse Cefpime 6 gms/240 mls as a continuous infusion over 24 hours via elastomeric device at 10 ml/hr. End date 05/20/21 change device once every 24 hours .   Yes [provider]  COLCRYS 0.6 MG tablet Take 0.6 mg by mouth 2 (two) times daily. 02/27/19  Yes [provider]  HYDROcodone-acetaminophen (NORCO) 10-325 MG per tablet Take 1 tablet by mouth every 6 (six) hours as needed. pain 01/31/14  Yes [provider]  predniSONE (DELTASONE) 10 MG tablet  Take 10 mg by mouth daily with breakfast.   Yes [provider]  sildenafil (REVATIO) 20 MG tablet Take 20 mg by mouth daily. 06/06/19  Yes [provider]  simvastatin (ZOCOR) 10 MG tablet Take 10 mg by mouth at bedtime. 01/31/14  Yes [provider]  vancomycin 1,500 mg in sodium chloride 0.9 % 250 mL Inject 1,500 mg into the vein every 12 (twelve) hours. Infuse 1000 mg into the vein every 100 ml NS over 1 hours, once every 12 hours at 173m/hr, via elastomeric device. End date 05/20/21 Weekly labs.: CMP,CBCcD,ESR. Non-cardiac   Yes [provider]  colchicine 0.6 MG tablet Take by mouth. Patient not taking: No sig reported    [provider]  gabapentin (NEURONTIN) 100 MG capsule Take 1 capsule by mouth daily.  Patient not taking: Reported on 04/13/2021 12/27/17   [provider]  nabumetone (RELAFEN) 750 MG tablet Take 750 mg by mouth daily.  Patient not taking: No sig reported 12/28/18   [provider]  ondansetron (ZOFRAN) 4 MG tablet Take 1 tablet (4 mg total) by mouth every 8 (eight) hours as needed for nausea or vomiting. Patient not taking: Reported on 06/20/2019 04/30/16   WClayton Bibles PA-C  predniSONE (DELTASONE) 20 MG tablet Take 3 tablets (60 mg total) by mouth daily with breakfast. And decrease by one tablet daily Patient not taking: No sig reported 02/25/18   TOrson Eva MD  simvastatin (ZOCOR) 10 MG tablet Take by mouth. Patient not taking: No sig reported    [provider]    Allergies    Gabapentin, Penicillins, Oxycodone, and Penicillamine  Review of Systems   Review of Systems  All other systems reviewed and are negative.  Physical Exam Updated Vital Signs BP (!) 141/84   Pulse 80   Temp 97.8 F (36.6 C) (Oral)   Resp (!) 22   SpO2 95%   Physical Exam Vitals and nursing note reviewed.  Constitutional:      General: He is not in acute distress.    Appearance: Normal appearance. He is ill-appearing.  HENT:     Head: Normocephalic and atraumatic.  Eyes:     General:        Right eye: No discharge.        Left eye: No discharge.  Cardiovascular:     Rate and Rhythm: Normal rate and regular rhythm.     Heart sounds: No murmur heard.   No friction rub. No gallop.  Pulmonary:     Effort: Pulmonary effort is normal. No respiratory distress.     Breath sounds: Normal breath sounds.     Comments: Some tachypnea Abdominal:     General: Bowel sounds are normal.     Palpations: Abdomen is soft.     Tenderness: There is no abdominal tenderness.  Musculoskeletal:     Comments: Patient with back brace, evidence of recent back surgery.  Skin:    General: Skin is warm and  dry.     Capillary Refill: Capillary refill takes less than 2 seconds.  Neurological:     Mental Status: He is alert. Mental status is at baseline.  Psychiatric:        Mood and Affect: Mood normal.        Behavior: Behavior normal.    ED Results / Procedures / Treatments   Labs (all labs ordered are listed, but only abnormal results are displayed) Labs Reviewed  CBC WITH DIFFERENTIAL/PLATELET - Abnormal;  Notable for the following components:      Result Value   WBC 11.0 (*)    RBC 3.29 (*)    Hemoglobin 10.7 (*)    HCT 32.5 (*)    Monocytes Absolute 1.7 (*)    Abs Immature Granulocytes 0.08 (*)    All other components within normal limits  COMPREHENSIVE METABOLIC PANEL - Abnormal; Notable for the following components:   Creatinine, Ser 1.63 (*)    Total Protein 5.8 (*)    Albumin 2.6 (*)    AST 13 (*)    GFR, Estimated 46 (*)    All other components within normal limits  URINALYSIS, ROUTINE W REFLEX MICROSCOPIC - Abnormal; Notable for the following components:   APPearance HAZY (*)    Hgb urine dipstick SMALL (*)    Ketones, ur 5 (*)    Protein, ur 30 (*)    All other components within normal limits  URINE CULTURE  VANCOMYCIN, RANDOM    EKG None  Radiology DG Chest Port 1 View  Result Date: 04/13/2021 CLINICAL DATA:  Shortness of breath EXAM: PORTABLE CHEST 1 VIEW COMPARISON:  Chest x-ray 06/23/2020, CT chest 06/23/2020 FINDINGS: Left PICC with tip overlying the expected region of the superior cavoatrial junction. The heart and mediastinal contours are unchanged. Aortic calcification. Cystic changes of the lungs. No focal consolidation. Chronic coarsened interstitial markings. No overt pulmonary edema. Trace persistent left pleural effusion. No right pleural effusion. No pneumothorax. No acute osseous abnormality. IMPRESSION: 1. Trace persistent left pleural effusion. 2. Emphysema (ICD10-J43.9). Electronically Signed   By: Iven Finn M.D.   On: 04/13/2021 21:52     Procedures Procedures   Medications Ordered in ED Medications  sodium chloride 0.9 % bolus 1,000 mL (1,000 mLs Intravenous New Bag/Given 04/13/21 2239)    ED Course  I have reviewed the triage vital signs and the nursing notes.  Pertinent labs & imaging results that were available during my care of the patient were reviewed by me and considered in my medical decision making (see chart for details).    MDM Rules/Calculators/A&P                         I discussed this case with my attending physician who cosigned this note including patient's presenting symptoms, physical exam, and planned diagnostics and interventions. Attending physician stated agreement with plan or made changes to plan which were implemented.   Briefly patient was sent to the emergency department with no complaints due to abnormal lab.  With investigation it was found that abnormal lab was his vancomycin trough level which was 37, up from a maximum high value of 15 this morning prior to receiving another dose of 1000 mg vancomycin.  Patient did discontinue vancomycin at this time.  Patient has no complaints.  Concern for acute kidney injury, patient reports that he is still making urine, no urinary symptoms at this time.  No confusion, no peripheral edema.  Spoke with pharmacist at [facility] who reviewed patient's lab work, creatinine value of 1.63 up from 1.55 this morning.  Given patient is not having acute decrease in urine output, and no other complaints at this time recommended fluid bolus, discharged in stable condition, repeat creatinine, and vancomycin trough in the morning, and follow-up with whoever is prescribing his antibiotics.  Patient understands and agrees to plan.  Wife informed of the plan.  Return precautions given. Final Clinical Impression(s) / ED Diagnoses Final diagnoses:  Accidental vancomycin poisoning, initial encounter    Rx / DC Orders ED Discharge Orders     None        Dorien Chihuahua 04/13/21 2312    [place] Ferguson, MD 04/17/21 [phone]

## 2021-04-14 ENCOUNTER — Other Ambulatory Visit (HOSPITAL_COMMUNITY)
Admission: RE | Admit: 2021-04-14 | Discharge: 2021-04-14 | Disposition: A | Discharge status: Home / Self Care | Payer: Medicare Other | Source: Other Acute Inpatient Hospital | Attending: Internal Medicine | Admitting: Internal Medicine

## 2021-04-14 DIAGNOSIS — T8463XA Infection and inflammatory reaction due to internal fixation device of spine, initial encounter: Secondary | ICD-10-CM | POA: Diagnosis not present

## 2021-04-14 DIAGNOSIS — T8142XA Infection following a procedure, deep incisional surgical site, initial encounter: Secondary | ICD-10-CM | POA: Diagnosis not present

## 2021-04-14 DIAGNOSIS — S32031D Stable burst fracture of third lumbar vertebra, subsequent encounter for fracture with routine healing: Secondary | ICD-10-CM | POA: Diagnosis not present

## 2021-04-14 DIAGNOSIS — S22089D Unspecified fracture of T11-T12 vertebra, subsequent encounter for fracture with routine healing: Secondary | ICD-10-CM | POA: Diagnosis not present

## 2021-04-14 DIAGNOSIS — G039 Meningitis, unspecified: Secondary | ICD-10-CM | POA: Diagnosis not present

## 2021-04-14 DIAGNOSIS — M869 Osteomyelitis, unspecified: Secondary | ICD-10-CM | POA: Diagnosis not present

## 2021-04-14 DIAGNOSIS — M4626 Osteomyelitis of vertebra, lumbar region: Secondary | ICD-10-CM | POA: Diagnosis not present

## 2021-04-14 LAB — BASIC METABOLIC PANEL
Anion gap: 7 (ref 5–15)
BUN: 23 mg/dL (ref 8–23)
CO2: 28 mmol/L (ref 22–32)
Calcium: 8.9 mg/dL (ref 8.9–10.3)
Chloride: 100 mmol/L (ref 98–111)
Creatinine, Ser: 1.64 mg/dL — ABNORMAL HIGH (ref 0.61–1.24)
GFR, Estimated: 46 mL/min — ABNORMAL LOW (ref >60)
Glucose, Bld: 178 mg/dL — ABNORMAL HIGH (ref 70–99)
Potassium: 4.8 mmol/L (ref 3.5–5.1)
Sodium: 135 mmol/L (ref 135–145)

## 2021-04-14 LAB — VANCOMYCIN, RANDOM: Vancomycin Rm: 30

## 2021-04-15 DIAGNOSIS — J449 Chronic obstructive pulmonary disease, unspecified: Secondary | ICD-10-CM | POA: Diagnosis not present

## 2021-04-15 DIAGNOSIS — M1991 Primary osteoarthritis, unspecified site: Secondary | ICD-10-CM | POA: Diagnosis not present

## 2021-04-15 DIAGNOSIS — E782 Mixed hyperlipidemia: Secondary | ICD-10-CM | POA: Diagnosis not present

## 2021-04-15 DIAGNOSIS — G894 Chronic pain syndrome: Secondary | ICD-10-CM | POA: Diagnosis not present

## 2021-04-15 LAB — URINE CULTURE: Culture: NO GROWTH

## 2021-04-16 DIAGNOSIS — S32031D Stable burst fracture of third lumbar vertebra, subsequent encounter for fracture with routine healing: Secondary | ICD-10-CM | POA: Diagnosis not present

## 2021-04-16 DIAGNOSIS — N179 Acute kidney failure, unspecified: Secondary | ICD-10-CM | POA: Diagnosis not present

## 2021-04-16 DIAGNOSIS — T8463XA Infection and inflammatory reaction due to internal fixation device of spine, initial encounter: Secondary | ICD-10-CM | POA: Diagnosis not present

## 2021-04-16 DIAGNOSIS — T82594A Other mechanical complication of infusion catheter, initial encounter: Secondary | ICD-10-CM | POA: Diagnosis not present

## 2021-04-16 DIAGNOSIS — Z969 Presence of functional implant, unspecified: Secondary | ICD-10-CM | POA: Diagnosis not present

## 2021-04-16 DIAGNOSIS — Z792 Long term (current) use of antibiotics: Secondary | ICD-10-CM | POA: Diagnosis not present

## 2021-04-16 DIAGNOSIS — T8142XA Infection following a procedure, deep incisional surgical site, initial encounter: Secondary | ICD-10-CM | POA: Diagnosis not present

## 2021-04-16 DIAGNOSIS — J449 Chronic obstructive pulmonary disease, unspecified: Secondary | ICD-10-CM | POA: Diagnosis not present

## 2021-04-16 DIAGNOSIS — J8410 Pulmonary fibrosis, unspecified: Secondary | ICD-10-CM | POA: Diagnosis not present

## 2021-04-16 DIAGNOSIS — M4626 Osteomyelitis of vertebra, lumbar region: Secondary | ICD-10-CM | POA: Diagnosis not present

## 2021-04-16 DIAGNOSIS — A86 Unspecified viral encephalitis: Secondary | ICD-10-CM | POA: Diagnosis not present

## 2021-04-16 DIAGNOSIS — S22089D Unspecified fracture of T11-T12 vertebra, subsequent encounter for fracture with routine healing: Secondary | ICD-10-CM | POA: Diagnosis not present

## 2021-04-16 DIAGNOSIS — G039 Meningitis, unspecified: Secondary | ICD-10-CM | POA: Diagnosis not present

## 2021-04-17 DIAGNOSIS — A86 Unspecified viral encephalitis: Secondary | ICD-10-CM | POA: Diagnosis not present

## 2021-04-17 DIAGNOSIS — Z79891 Long term (current) use of opiate analgesic: Secondary | ICD-10-CM | POA: Diagnosis not present

## 2021-04-17 DIAGNOSIS — T8142XA Infection following a procedure, deep incisional surgical site, initial encounter: Secondary | ICD-10-CM | POA: Diagnosis not present

## 2021-04-17 DIAGNOSIS — S32031D Stable burst fracture of third lumbar vertebra, subsequent encounter for fracture with routine healing: Secondary | ICD-10-CM | POA: Diagnosis not present

## 2021-04-17 DIAGNOSIS — Z969 Presence of functional implant, unspecified: Secondary | ICD-10-CM | POA: Diagnosis not present

## 2021-04-17 DIAGNOSIS — T82594A Other mechanical complication of infusion catheter, initial encounter: Secondary | ICD-10-CM | POA: Diagnosis not present

## 2021-04-17 DIAGNOSIS — T8463XA Infection and inflammatory reaction due to internal fixation device of spine, initial encounter: Secondary | ICD-10-CM | POA: Diagnosis not present

## 2021-04-17 DIAGNOSIS — N179 Acute kidney failure, unspecified: Secondary | ICD-10-CM | POA: Diagnosis not present

## 2021-04-17 DIAGNOSIS — G039 Meningitis, unspecified: Secondary | ICD-10-CM | POA: Diagnosis not present

## 2021-04-17 DIAGNOSIS — Z720 Tobacco use: Secondary | ICD-10-CM | POA: Diagnosis not present

## 2021-04-17 DIAGNOSIS — J8410 Pulmonary fibrosis, unspecified: Secondary | ICD-10-CM | POA: Diagnosis not present

## 2021-04-17 DIAGNOSIS — M4626 Osteomyelitis of vertebra, lumbar region: Secondary | ICD-10-CM | POA: Diagnosis not present

## 2021-04-17 DIAGNOSIS — Z792 Long term (current) use of antibiotics: Secondary | ICD-10-CM | POA: Diagnosis not present

## 2021-04-17 DIAGNOSIS — S22089D Unspecified fracture of T11-T12 vertebra, subsequent encounter for fracture with routine healing: Secondary | ICD-10-CM | POA: Diagnosis not present

## 2021-04-17 DIAGNOSIS — J449 Chronic obstructive pulmonary disease, unspecified: Secondary | ICD-10-CM | POA: Diagnosis not present

## 2021-04-20 ENCOUNTER — Other Ambulatory Visit (HOSPITAL_COMMUNITY)
Admission: RE | Admit: 2021-04-20 | Discharge: 2021-04-20 | Disposition: A | Discharge status: Home / Self Care | Payer: Medicare Other | Source: Other Acute Inpatient Hospital | Attending: Internal Medicine | Admitting: Internal Medicine

## 2021-04-20 DIAGNOSIS — M869 Osteomyelitis, unspecified: Secondary | ICD-10-CM | POA: Insufficient documentation

## 2021-04-20 DIAGNOSIS — S32031D Stable burst fracture of third lumbar vertebra, subsequent encounter for fracture with routine healing: Secondary | ICD-10-CM | POA: Diagnosis not present

## 2021-04-20 DIAGNOSIS — M4626 Osteomyelitis of vertebra, lumbar region: Secondary | ICD-10-CM | POA: Diagnosis not present

## 2021-04-20 DIAGNOSIS — S22089D Unspecified fracture of T11-T12 vertebra, subsequent encounter for fracture with routine healing: Secondary | ICD-10-CM | POA: Diagnosis not present

## 2021-04-20 DIAGNOSIS — T8142XA Infection following a procedure, deep incisional surgical site, initial encounter: Secondary | ICD-10-CM | POA: Diagnosis not present

## 2021-04-20 DIAGNOSIS — G039 Meningitis, unspecified: Secondary | ICD-10-CM | POA: Diagnosis not present

## 2021-04-20 DIAGNOSIS — T8463XA Infection and inflammatory reaction due to internal fixation device of spine, initial encounter: Secondary | ICD-10-CM | POA: Diagnosis not present

## 2021-04-20 LAB — CBC WITH DIFFERENTIAL/PLATELET
Abs Immature Granulocytes: 0.08 10*3/uL — ABNORMAL HIGH (ref 0.00–0.07)
Basophils Absolute: 0.1 10*3/uL (ref 0.0–0.1)
Basophils Relative: 1 %
Eosinophils Absolute: 0.3 10*3/uL (ref 0.0–0.5)
Eosinophils Relative: 2 %
HCT: 36.6 % — ABNORMAL LOW (ref 39.0–52.0)
Hemoglobin: 12 g/dL — ABNORMAL LOW (ref 13.0–17.0)
Immature Granulocytes: 1 %
Lymphocytes Relative: 24 %
Lymphs Abs: 3.1 10*3/uL (ref 0.7–4.0)
MCH: 32.6 pg (ref 26.0–34.0)
MCHC: 32.8 g/dL (ref 30.0–36.0)
MCV: 99.5 fL (ref 80.0–100.0)
Monocytes Absolute: 1.6 10*3/uL — ABNORMAL HIGH (ref 0.1–1.0)
Monocytes Relative: 12 %
Neutro Abs: 8 10*3/uL — ABNORMAL HIGH (ref 1.7–7.7)
Neutrophils Relative %: 60 %
Platelets: 342 10*3/uL (ref 150–400)
RBC: 3.68 MIL/uL — ABNORMAL LOW (ref 4.22–5.81)
RDW: 13.4 % (ref 11.5–15.5)
WBC: 13.2 10*3/uL — ABNORMAL HIGH (ref 4.0–10.5)
nRBC: 0 % (ref 0.0–0.2)

## 2021-04-20 LAB — COMPREHENSIVE METABOLIC PANEL
ALT: 10 U/L (ref 0–44)
AST: 12 U/L — ABNORMAL LOW (ref 15–41)
Albumin: 3 g/dL — ABNORMAL LOW (ref 3.5–5.0)
Alkaline Phosphatase: 84 U/L (ref 38–126)
Anion gap: 10 (ref 5–15)
BUN: 21 mg/dL (ref 8–23)
CO2: 30 mmol/L (ref 22–32)
Calcium: 9.5 mg/dL (ref 8.9–10.3)
Chloride: 97 mmol/L — ABNORMAL LOW (ref 98–111)
Creatinine, Ser: 1.72 mg/dL — ABNORMAL HIGH (ref 0.61–1.24)
GFR, Estimated: 43 mL/min — ABNORMAL LOW (ref >60)
Glucose, Bld: 107 mg/dL — ABNORMAL HIGH (ref 70–99)
Potassium: 3.9 mmol/L (ref 3.5–5.1)
Sodium: 137 mmol/L (ref 135–145)
Total Bilirubin: 0.3 mg/dL (ref 0.3–1.2)
Total Protein: 6.9 g/dL (ref 6.5–8.1)

## 2021-04-20 LAB — VANCOMYCIN, RANDOM: Vancomycin Rm: 7

## 2021-04-20 LAB — C-REACTIVE PROTEIN: CRP: 3.5 mg/dL — ABNORMAL HIGH (ref <1.0)

## 2021-04-20 LAB — SEDIMENTATION RATE: Sed Rate: 90 mm/hr — ABNORMAL HIGH (ref 0–16)

## 2021-04-21 DIAGNOSIS — S32031D Stable burst fracture of third lumbar vertebra, subsequent encounter for fracture with routine healing: Secondary | ICD-10-CM | POA: Diagnosis not present

## 2021-04-21 DIAGNOSIS — G039 Meningitis, unspecified: Secondary | ICD-10-CM | POA: Diagnosis not present

## 2021-04-21 DIAGNOSIS — T8142XA Infection following a procedure, deep incisional surgical site, initial encounter: Secondary | ICD-10-CM | POA: Diagnosis not present

## 2021-04-21 DIAGNOSIS — M4626 Osteomyelitis of vertebra, lumbar region: Secondary | ICD-10-CM | POA: Diagnosis not present

## 2021-04-21 DIAGNOSIS — S22089D Unspecified fracture of T11-T12 vertebra, subsequent encounter for fracture with routine healing: Secondary | ICD-10-CM | POA: Diagnosis not present

## 2021-04-21 DIAGNOSIS — T8463XA Infection and inflammatory reaction due to internal fixation device of spine, initial encounter: Secondary | ICD-10-CM | POA: Diagnosis not present

## 2021-04-22 DIAGNOSIS — T8142XA Infection following a procedure, deep incisional surgical site, initial encounter: Secondary | ICD-10-CM | POA: Diagnosis not present

## 2021-04-22 DIAGNOSIS — G039 Meningitis, unspecified: Secondary | ICD-10-CM | POA: Diagnosis not present

## 2021-04-22 DIAGNOSIS — S22089D Unspecified fracture of T11-T12 vertebra, subsequent encounter for fracture with routine healing: Secondary | ICD-10-CM | POA: Diagnosis not present

## 2021-04-22 DIAGNOSIS — S32031D Stable burst fracture of third lumbar vertebra, subsequent encounter for fracture with routine healing: Secondary | ICD-10-CM | POA: Diagnosis not present

## 2021-04-22 DIAGNOSIS — M4626 Osteomyelitis of vertebra, lumbar region: Secondary | ICD-10-CM | POA: Diagnosis not present

## 2021-04-22 DIAGNOSIS — T8463XA Infection and inflammatory reaction due to internal fixation device of spine, initial encounter: Secondary | ICD-10-CM | POA: Diagnosis not present

## 2021-04-23 ENCOUNTER — Other Ambulatory Visit (HOSPITAL_COMMUNITY)
Admission: RE | Admit: 2021-04-23 | Discharge: 2021-04-23 | Disposition: A | Discharge status: Home / Self Care | Payer: Medicare Other | Source: Other Acute Inpatient Hospital | Attending: Internal Medicine | Admitting: Internal Medicine

## 2021-04-23 DIAGNOSIS — S22089D Unspecified fracture of T11-T12 vertebra, subsequent encounter for fracture with routine healing: Secondary | ICD-10-CM | POA: Diagnosis not present

## 2021-04-23 DIAGNOSIS — G039 Meningitis, unspecified: Secondary | ICD-10-CM | POA: Diagnosis not present

## 2021-04-23 DIAGNOSIS — T8463XA Infection and inflammatory reaction due to internal fixation device of spine, initial encounter: Secondary | ICD-10-CM | POA: Diagnosis not present

## 2021-04-23 DIAGNOSIS — M4626 Osteomyelitis of vertebra, lumbar region: Secondary | ICD-10-CM | POA: Diagnosis not present

## 2021-04-23 DIAGNOSIS — S32031D Stable burst fracture of third lumbar vertebra, subsequent encounter for fracture with routine healing: Secondary | ICD-10-CM | POA: Diagnosis not present

## 2021-04-23 DIAGNOSIS — T8142XA Infection following a procedure, deep incisional surgical site, initial encounter: Secondary | ICD-10-CM | POA: Diagnosis not present

## 2021-04-23 LAB — COMPREHENSIVE METABOLIC PANEL
ALT: 11 U/L (ref 0–44)
AST: 14 U/L — ABNORMAL LOW (ref 15–41)
Albumin: 2.8 g/dL — ABNORMAL LOW (ref 3.5–5.0)
Alkaline Phosphatase: 82 U/L (ref 38–126)
Anion gap: 10 (ref 5–15)
BUN: 21 mg/dL (ref 8–23)
CO2: 30 mmol/L (ref 22–32)
Calcium: 9.2 mg/dL (ref 8.9–10.3)
Chloride: 98 mmol/L (ref 98–111)
Creatinine, Ser: 1.5 mg/dL — ABNORMAL HIGH (ref 0.61–1.24)
GFR, Estimated: 51 mL/min — ABNORMAL LOW (ref >60)
Glucose, Bld: 139 mg/dL — ABNORMAL HIGH (ref 70–99)
Potassium: 4 mmol/L (ref 3.5–5.1)
Sodium: 138 mmol/L (ref 135–145)
Total Bilirubin: 0.6 mg/dL (ref 0.3–1.2)
Total Protein: 5.9 g/dL — ABNORMAL LOW (ref 6.5–8.1)

## 2021-04-23 LAB — VANCOMYCIN, RANDOM: Vancomycin Rm: 8

## 2021-04-24 DIAGNOSIS — T8463XA Infection and inflammatory reaction due to internal fixation device of spine, initial encounter: Secondary | ICD-10-CM | POA: Diagnosis not present

## 2021-04-24 DIAGNOSIS — T8142XA Infection following a procedure, deep incisional surgical site, initial encounter: Secondary | ICD-10-CM | POA: Diagnosis not present

## 2021-04-24 DIAGNOSIS — S22089D Unspecified fracture of T11-T12 vertebra, subsequent encounter for fracture with routine healing: Secondary | ICD-10-CM | POA: Diagnosis not present

## 2021-04-24 DIAGNOSIS — G039 Meningitis, unspecified: Secondary | ICD-10-CM | POA: Diagnosis not present

## 2021-04-24 DIAGNOSIS — M4626 Osteomyelitis of vertebra, lumbar region: Secondary | ICD-10-CM | POA: Diagnosis not present

## 2021-04-24 DIAGNOSIS — S32031D Stable burst fracture of third lumbar vertebra, subsequent encounter for fracture with routine healing: Secondary | ICD-10-CM | POA: Diagnosis not present

## 2021-04-27 ENCOUNTER — Other Ambulatory Visit (HOSPITAL_COMMUNITY)
Admission: RE | Admit: 2021-04-27 | Discharge: 2021-04-27 | Disposition: A | Discharge status: Home / Self Care | Payer: Medicare Other | Source: Other Acute Inpatient Hospital | Attending: Internal Medicine | Admitting: Internal Medicine

## 2021-04-27 DIAGNOSIS — S22089D Unspecified fracture of T11-T12 vertebra, subsequent encounter for fracture with routine healing: Secondary | ICD-10-CM | POA: Diagnosis not present

## 2021-04-27 DIAGNOSIS — T8142XA Infection following a procedure, deep incisional surgical site, initial encounter: Secondary | ICD-10-CM | POA: Diagnosis not present

## 2021-04-27 DIAGNOSIS — M4626 Osteomyelitis of vertebra, lumbar region: Secondary | ICD-10-CM | POA: Diagnosis not present

## 2021-04-27 DIAGNOSIS — M869 Osteomyelitis, unspecified: Secondary | ICD-10-CM | POA: Diagnosis not present

## 2021-04-27 DIAGNOSIS — T8463XA Infection and inflammatory reaction due to internal fixation device of spine, initial encounter: Secondary | ICD-10-CM | POA: Diagnosis not present

## 2021-04-27 DIAGNOSIS — S32031D Stable burst fracture of third lumbar vertebra, subsequent encounter for fracture with routine healing: Secondary | ICD-10-CM | POA: Diagnosis not present

## 2021-04-27 DIAGNOSIS — G039 Meningitis, unspecified: Secondary | ICD-10-CM | POA: Diagnosis not present

## 2021-04-27 LAB — CBC WITH DIFFERENTIAL/PLATELET
Abs Immature Granulocytes: 0.03 10*3/uL (ref 0.00–0.07)
Basophils Absolute: 0.1 10*3/uL (ref 0.0–0.1)
Basophils Relative: 1 %
Eosinophils Absolute: 0.3 10*3/uL (ref 0.0–0.5)
Eosinophils Relative: 3 %
HCT: 33.7 % — ABNORMAL LOW (ref 39.0–52.0)
Hemoglobin: 11 g/dL — ABNORMAL LOW (ref 13.0–17.0)
Immature Granulocytes: 0 %
Lymphocytes Relative: 30 %
Lymphs Abs: 3.3 10*3/uL (ref 0.7–4.0)
MCH: 32 pg (ref 26.0–34.0)
MCHC: 32.6 g/dL (ref 30.0–36.0)
MCV: 98 fL (ref 80.0–100.0)
Monocytes Absolute: 1.3 10*3/uL — ABNORMAL HIGH (ref 0.1–1.0)
Monocytes Relative: 12 %
Neutro Abs: 6 10*3/uL (ref 1.7–7.7)
Neutrophils Relative %: 54 %
Platelets: 301 10*3/uL (ref 150–400)
RBC: 3.44 MIL/uL — ABNORMAL LOW (ref 4.22–5.81)
RDW: 13.2 % (ref 11.5–15.5)
WBC: 11 10*3/uL — ABNORMAL HIGH (ref 4.0–10.5)
nRBC: 0 % (ref 0.0–0.2)

## 2021-04-27 LAB — BASIC METABOLIC PANEL
Anion gap: 8 (ref 5–15)
BUN: 20 mg/dL (ref 8–23)
CO2: 28 mmol/L (ref 22–32)
Calcium: 8.9 mg/dL (ref 8.9–10.3)
Chloride: 100 mmol/L (ref 98–111)
Creatinine, Ser: 1.51 mg/dL — ABNORMAL HIGH (ref 0.61–1.24)
GFR, Estimated: 50 mL/min — ABNORMAL LOW (ref >60)
Glucose, Bld: 97 mg/dL (ref 70–99)
Potassium: 4.5 mmol/L (ref 3.5–5.1)
Sodium: 136 mmol/L (ref 135–145)

## 2021-04-27 LAB — VANCOMYCIN, RANDOM: Vancomycin Rm: 12

## 2021-04-27 LAB — C-REACTIVE PROTEIN: CRP: 2.1 mg/dL — ABNORMAL HIGH (ref <1.0)

## 2021-04-27 LAB — SEDIMENTATION RATE: Sed Rate: 80 mm/hr — ABNORMAL HIGH (ref 0–16)

## 2021-04-29 DIAGNOSIS — G039 Meningitis, unspecified: Secondary | ICD-10-CM | POA: Diagnosis not present

## 2021-04-29 DIAGNOSIS — S32031D Stable burst fracture of third lumbar vertebra, subsequent encounter for fracture with routine healing: Secondary | ICD-10-CM | POA: Diagnosis not present

## 2021-04-29 DIAGNOSIS — T8463XA Infection and inflammatory reaction due to internal fixation device of spine, initial encounter: Secondary | ICD-10-CM | POA: Diagnosis not present

## 2021-04-29 DIAGNOSIS — M4626 Osteomyelitis of vertebra, lumbar region: Secondary | ICD-10-CM | POA: Diagnosis not present

## 2021-04-29 DIAGNOSIS — T8142XA Infection following a procedure, deep incisional surgical site, initial encounter: Secondary | ICD-10-CM | POA: Diagnosis not present

## 2021-04-29 DIAGNOSIS — S22089D Unspecified fracture of T11-T12 vertebra, subsequent encounter for fracture with routine healing: Secondary | ICD-10-CM | POA: Diagnosis not present

## 2021-04-30 DIAGNOSIS — G039 Meningitis, unspecified: Secondary | ICD-10-CM | POA: Diagnosis not present

## 2021-04-30 DIAGNOSIS — S32031D Stable burst fracture of third lumbar vertebra, subsequent encounter for fracture with routine healing: Secondary | ICD-10-CM | POA: Diagnosis not present

## 2021-04-30 DIAGNOSIS — T8463XA Infection and inflammatory reaction due to internal fixation device of spine, initial encounter: Secondary | ICD-10-CM | POA: Diagnosis not present

## 2021-04-30 DIAGNOSIS — S22089D Unspecified fracture of T11-T12 vertebra, subsequent encounter for fracture with routine healing: Secondary | ICD-10-CM | POA: Diagnosis not present

## 2021-04-30 DIAGNOSIS — M4626 Osteomyelitis of vertebra, lumbar region: Secondary | ICD-10-CM | POA: Diagnosis not present

## 2021-04-30 DIAGNOSIS — T8142XA Infection following a procedure, deep incisional surgical site, initial encounter: Secondary | ICD-10-CM | POA: Diagnosis not present

## 2021-05-01 DIAGNOSIS — T8142XA Infection following a procedure, deep incisional surgical site, initial encounter: Secondary | ICD-10-CM | POA: Diagnosis not present

## 2021-05-01 DIAGNOSIS — M4626 Osteomyelitis of vertebra, lumbar region: Secondary | ICD-10-CM | POA: Diagnosis not present

## 2021-05-01 DIAGNOSIS — G039 Meningitis, unspecified: Secondary | ICD-10-CM | POA: Diagnosis not present

## 2021-05-01 DIAGNOSIS — T8463XA Infection and inflammatory reaction due to internal fixation device of spine, initial encounter: Secondary | ICD-10-CM | POA: Diagnosis not present

## 2021-05-01 DIAGNOSIS — S22089D Unspecified fracture of T11-T12 vertebra, subsequent encounter for fracture with routine healing: Secondary | ICD-10-CM | POA: Diagnosis not present

## 2021-05-01 DIAGNOSIS — S32031D Stable burst fracture of third lumbar vertebra, subsequent encounter for fracture with routine healing: Secondary | ICD-10-CM | POA: Diagnosis not present

## 2021-05-04 ENCOUNTER — Other Ambulatory Visit (HOSPITAL_COMMUNITY)
Admission: RE | Admit: 2021-05-04 | Discharge: 2021-05-04 | Disposition: A | Discharge status: Home / Self Care | Payer: Medicare Other | Source: Other Acute Inpatient Hospital | Attending: Internal Medicine | Admitting: Internal Medicine

## 2021-05-04 DIAGNOSIS — M4626 Osteomyelitis of vertebra, lumbar region: Secondary | ICD-10-CM | POA: Diagnosis not present

## 2021-05-04 DIAGNOSIS — M869 Osteomyelitis, unspecified: Secondary | ICD-10-CM | POA: Insufficient documentation

## 2021-05-04 DIAGNOSIS — T8463XA Infection and inflammatory reaction due to internal fixation device of spine, initial encounter: Secondary | ICD-10-CM | POA: Diagnosis not present

## 2021-05-04 DIAGNOSIS — S22089D Unspecified fracture of T11-T12 vertebra, subsequent encounter for fracture with routine healing: Secondary | ICD-10-CM | POA: Diagnosis not present

## 2021-05-04 DIAGNOSIS — T8142XA Infection following a procedure, deep incisional surgical site, initial encounter: Secondary | ICD-10-CM | POA: Diagnosis not present

## 2021-05-04 DIAGNOSIS — G039 Meningitis, unspecified: Secondary | ICD-10-CM | POA: Diagnosis not present

## 2021-05-04 DIAGNOSIS — S32031D Stable burst fracture of third lumbar vertebra, subsequent encounter for fracture with routine healing: Secondary | ICD-10-CM | POA: Diagnosis not present

## 2021-05-04 LAB — COMPREHENSIVE METABOLIC PANEL
ALT: 10 U/L (ref 0–44)
AST: 15 U/L (ref 15–41)
Albumin: 2.7 g/dL — ABNORMAL LOW (ref 3.5–5.0)
Alkaline Phosphatase: 71 U/L (ref 38–126)
Anion gap: 9 (ref 5–15)
BUN: 22 mg/dL (ref 8–23)
CO2: 27 mmol/L (ref 22–32)
Calcium: 9 mg/dL (ref 8.9–10.3)
Chloride: 100 mmol/L (ref 98–111)
Creatinine, Ser: 1.74 mg/dL — ABNORMAL HIGH (ref 0.61–1.24)
GFR, Estimated: 42 mL/min — ABNORMAL LOW (ref >60)
Glucose, Bld: 98 mg/dL (ref 70–99)
Potassium: 4.6 mmol/L (ref 3.5–5.1)
Sodium: 136 mmol/L (ref 135–145)
Total Bilirubin: 0.8 mg/dL (ref 0.3–1.2)
Total Protein: 5.8 g/dL — ABNORMAL LOW (ref 6.5–8.1)

## 2021-05-04 LAB — CBC WITH DIFFERENTIAL/PLATELET
Abs Immature Granulocytes: 0.08 10*3/uL — ABNORMAL HIGH (ref 0.00–0.07)
Basophils Absolute: 0.1 10*3/uL (ref 0.0–0.1)
Basophils Relative: 1 %
Eosinophils Absolute: 0.5 10*3/uL (ref 0.0–0.5)
Eosinophils Relative: 4 %
HCT: 35.3 % — ABNORMAL LOW (ref 39.0–52.0)
Hemoglobin: 11.6 g/dL — ABNORMAL LOW (ref 13.0–17.0)
Immature Granulocytes: 1 %
Lymphocytes Relative: 31 %
Lymphs Abs: 4.3 10*3/uL — ABNORMAL HIGH (ref 0.7–4.0)
MCH: 31.9 pg (ref 26.0–34.0)
MCHC: 32.9 g/dL (ref 30.0–36.0)
MCV: 97 fL (ref 80.0–100.0)
Monocytes Absolute: 1.7 10*3/uL — ABNORMAL HIGH (ref 0.1–1.0)
Monocytes Relative: 12 %
Neutro Abs: 7.2 10*3/uL (ref 1.7–7.7)
Neutrophils Relative %: 51 %
Platelets: 270 10*3/uL (ref 150–400)
RBC: 3.64 MIL/uL — ABNORMAL LOW (ref 4.22–5.81)
RDW: 13.2 % (ref 11.5–15.5)
WBC: 13.9 10*3/uL — ABNORMAL HIGH (ref 4.0–10.5)
nRBC: 0 % (ref 0.0–0.2)

## 2021-05-04 LAB — VANCOMYCIN, RANDOM: Vancomycin Rm: 12

## 2021-05-04 LAB — SEDIMENTATION RATE: Sed Rate: 70 mm/hr — ABNORMAL HIGH (ref 0–16)

## 2021-05-04 LAB — C-REACTIVE PROTEIN: CRP: 2.9 mg/dL — ABNORMAL HIGH (ref <1.0)

## 2021-05-05 DIAGNOSIS — G039 Meningitis, unspecified: Secondary | ICD-10-CM | POA: Diagnosis not present

## 2021-05-05 DIAGNOSIS — T8142XA Infection following a procedure, deep incisional surgical site, initial encounter: Secondary | ICD-10-CM | POA: Diagnosis not present

## 2021-05-05 DIAGNOSIS — M4626 Osteomyelitis of vertebra, lumbar region: Secondary | ICD-10-CM | POA: Diagnosis not present

## 2021-05-05 DIAGNOSIS — S22089D Unspecified fracture of T11-T12 vertebra, subsequent encounter for fracture with routine healing: Secondary | ICD-10-CM | POA: Diagnosis not present

## 2021-05-05 DIAGNOSIS — S32031D Stable burst fracture of third lumbar vertebra, subsequent encounter for fracture with routine healing: Secondary | ICD-10-CM | POA: Diagnosis not present

## 2021-05-05 DIAGNOSIS — T8463XA Infection and inflammatory reaction due to internal fixation device of spine, initial encounter: Secondary | ICD-10-CM | POA: Diagnosis not present

## 2021-05-09 DIAGNOSIS — M4626 Osteomyelitis of vertebra, lumbar region: Secondary | ICD-10-CM | POA: Diagnosis not present

## 2021-05-09 DIAGNOSIS — T50905D Adverse effect of unspecified drugs, medicaments and biological substances, subsequent encounter: Secondary | ICD-10-CM | POA: Diagnosis not present

## 2021-05-09 DIAGNOSIS — Z792 Long term (current) use of antibiotics: Secondary | ICD-10-CM | POA: Diagnosis not present

## 2021-05-09 DIAGNOSIS — G894 Chronic pain syndrome: Secondary | ICD-10-CM | POA: Diagnosis not present

## 2021-05-09 DIAGNOSIS — T8142XA Infection following a procedure, deep incisional surgical site, initial encounter: Secondary | ICD-10-CM | POA: Diagnosis not present

## 2021-05-09 DIAGNOSIS — S22089D Unspecified fracture of T11-T12 vertebra, subsequent encounter for fracture with routine healing: Secondary | ICD-10-CM | POA: Diagnosis not present

## 2021-05-09 DIAGNOSIS — G8929 Other chronic pain: Secondary | ICD-10-CM | POA: Diagnosis not present

## 2021-05-09 DIAGNOSIS — Z9181 History of falling: Secondary | ICD-10-CM | POA: Diagnosis not present

## 2021-05-09 DIAGNOSIS — F17210 Nicotine dependence, cigarettes, uncomplicated: Secondary | ICD-10-CM | POA: Diagnosis not present

## 2021-05-09 DIAGNOSIS — I2510 Atherosclerotic heart disease of native coronary artery without angina pectoris: Secondary | ICD-10-CM | POA: Diagnosis not present

## 2021-05-09 DIAGNOSIS — Z79891 Long term (current) use of opiate analgesic: Secondary | ICD-10-CM | POA: Diagnosis not present

## 2021-05-09 DIAGNOSIS — L89522 Pressure ulcer of left ankle, stage 2: Secondary | ICD-10-CM | POA: Diagnosis not present

## 2021-05-09 DIAGNOSIS — J189 Pneumonia, unspecified organism: Secondary | ICD-10-CM | POA: Diagnosis not present

## 2021-05-09 DIAGNOSIS — Z981 Arthrodesis status: Secondary | ICD-10-CM | POA: Diagnosis not present

## 2021-05-09 DIAGNOSIS — W19XXXD Unspecified fall, subsequent encounter: Secondary | ICD-10-CM | POA: Diagnosis not present

## 2021-05-09 DIAGNOSIS — Z5181 Encounter for therapeutic drug level monitoring: Secondary | ICD-10-CM | POA: Diagnosis not present

## 2021-05-09 DIAGNOSIS — K5903 Drug induced constipation: Secondary | ICD-10-CM | POA: Diagnosis not present

## 2021-05-09 DIAGNOSIS — J8410 Pulmonary fibrosis, unspecified: Secondary | ICD-10-CM | POA: Diagnosis not present

## 2021-05-09 DIAGNOSIS — G039 Meningitis, unspecified: Secondary | ICD-10-CM | POA: Diagnosis not present

## 2021-05-09 DIAGNOSIS — S32031D Stable burst fracture of third lumbar vertebra, subsequent encounter for fracture with routine healing: Secondary | ICD-10-CM | POA: Diagnosis not present

## 2021-05-09 DIAGNOSIS — J440 Chronic obstructive pulmonary disease with acute lower respiratory infection: Secondary | ICD-10-CM | POA: Diagnosis not present

## 2021-05-09 DIAGNOSIS — Z452 Encounter for adjustment and management of vascular access device: Secondary | ICD-10-CM | POA: Diagnosis not present

## 2021-05-09 DIAGNOSIS — T8463XA Infection and inflammatory reaction due to internal fixation device of spine, initial encounter: Secondary | ICD-10-CM | POA: Diagnosis not present

## 2021-05-09 DIAGNOSIS — J9621 Acute and chronic respiratory failure with hypoxia: Secondary | ICD-10-CM | POA: Diagnosis not present

## 2021-05-11 ENCOUNTER — Other Ambulatory Visit (HOSPITAL_COMMUNITY)
Admission: RE | Admit: 2021-05-11 | Discharge: 2021-05-11 | Disposition: A | Discharge status: Home / Self Care | Payer: Medicare Other | Source: Skilled Nursing Facility | Attending: Psychiatry | Admitting: Psychiatry

## 2021-05-11 DIAGNOSIS — E782 Mixed hyperlipidemia: Secondary | ICD-10-CM | POA: Diagnosis not present

## 2021-05-11 DIAGNOSIS — J439 Emphysema, unspecified: Secondary | ICD-10-CM | POA: Diagnosis present

## 2021-05-11 DIAGNOSIS — T361X5A Adverse effect of cephalosporins and other beta-lactam antibiotics, initial encounter: Secondary | ICD-10-CM | POA: Diagnosis present

## 2021-05-11 DIAGNOSIS — Z825 Family history of asthma and other chronic lower respiratory diseases: Secondary | ICD-10-CM | POA: Diagnosis not present

## 2021-05-11 DIAGNOSIS — M109 Gout, unspecified: Secondary | ICD-10-CM | POA: Diagnosis present

## 2021-05-11 DIAGNOSIS — M869 Osteomyelitis, unspecified: Secondary | ICD-10-CM | POA: Insufficient documentation

## 2021-05-11 DIAGNOSIS — G928 Other toxic encephalopathy: Secondary | ICD-10-CM | POA: Diagnosis present

## 2021-05-11 DIAGNOSIS — G9341 Metabolic encephalopathy: Secondary | ICD-10-CM | POA: Diagnosis not present

## 2021-05-11 DIAGNOSIS — G894 Chronic pain syndrome: Secondary | ICD-10-CM | POA: Diagnosis not present

## 2021-05-11 DIAGNOSIS — A86 Unspecified viral encephalitis: Secondary | ICD-10-CM | POA: Diagnosis not present

## 2021-05-11 DIAGNOSIS — T8142XA Infection following a procedure, deep incisional surgical site, initial encounter: Secondary | ICD-10-CM | POA: Diagnosis not present

## 2021-05-11 DIAGNOSIS — Z808 Family history of malignant neoplasm of other organs or systems: Secondary | ICD-10-CM | POA: Diagnosis not present

## 2021-05-11 DIAGNOSIS — S22089D Unspecified fracture of T11-T12 vertebra, subsequent encounter for fracture with routine healing: Secondary | ICD-10-CM | POA: Diagnosis not present

## 2021-05-11 DIAGNOSIS — G009 Bacterial meningitis, unspecified: Secondary | ICD-10-CM | POA: Diagnosis not present

## 2021-05-11 DIAGNOSIS — Z20822 Contact with and (suspected) exposure to covid-19: Secondary | ICD-10-CM | POA: Diagnosis present

## 2021-05-11 DIAGNOSIS — M4626 Osteomyelitis of vertebra, lumbar region: Secondary | ICD-10-CM | POA: Diagnosis not present

## 2021-05-11 DIAGNOSIS — G039 Meningitis, unspecified: Secondary | ICD-10-CM | POA: Diagnosis not present

## 2021-05-11 DIAGNOSIS — F17210 Nicotine dependence, cigarettes, uncomplicated: Secondary | ICD-10-CM | POA: Diagnosis present

## 2021-05-11 DIAGNOSIS — Z79899 Other long term (current) drug therapy: Secondary | ICD-10-CM | POA: Diagnosis not present

## 2021-05-11 DIAGNOSIS — G0490 Encephalitis and encephalomyelitis, unspecified: Secondary | ICD-10-CM | POA: Diagnosis present

## 2021-05-11 DIAGNOSIS — J9611 Chronic respiratory failure with hypoxia: Secondary | ICD-10-CM | POA: Diagnosis present

## 2021-05-11 DIAGNOSIS — S32031D Stable burst fracture of third lumbar vertebra, subsequent encounter for fracture with routine healing: Secondary | ICD-10-CM | POA: Diagnosis not present

## 2021-05-11 DIAGNOSIS — Z8249 Family history of ischemic heart disease and other diseases of the circulatory system: Secondary | ICD-10-CM | POA: Diagnosis not present

## 2021-05-11 DIAGNOSIS — M549 Dorsalgia, unspecified: Secondary | ICD-10-CM | POA: Diagnosis present

## 2021-05-11 DIAGNOSIS — T8463XA Infection and inflammatory reaction due to internal fixation device of spine, initial encounter: Secondary | ICD-10-CM | POA: Diagnosis not present

## 2021-05-11 DIAGNOSIS — M1990 Unspecified osteoarthritis, unspecified site: Secondary | ICD-10-CM | POA: Diagnosis present

## 2021-05-11 DIAGNOSIS — J84112 Idiopathic pulmonary fibrosis: Secondary | ICD-10-CM | POA: Diagnosis present

## 2021-05-11 DIAGNOSIS — Z841 Family history of disorders of kidney and ureter: Secondary | ICD-10-CM | POA: Diagnosis not present

## 2021-05-11 DIAGNOSIS — Z885 Allergy status to narcotic agent status: Secondary | ICD-10-CM | POA: Diagnosis not present

## 2021-05-11 DIAGNOSIS — Z880 Allergy status to penicillin: Secondary | ICD-10-CM | POA: Diagnosis not present

## 2021-05-11 DIAGNOSIS — Z7952 Long term (current) use of systemic steroids: Secondary | ICD-10-CM | POA: Diagnosis not present

## 2021-05-11 DIAGNOSIS — R519 Headache, unspecified: Secondary | ICD-10-CM | POA: Diagnosis not present

## 2021-05-11 DIAGNOSIS — I5032 Chronic diastolic (congestive) heart failure: Secondary | ICD-10-CM | POA: Diagnosis not present

## 2021-05-11 DIAGNOSIS — R4182 Altered mental status, unspecified: Secondary | ICD-10-CM | POA: Diagnosis not present

## 2021-05-11 DIAGNOSIS — G8929 Other chronic pain: Secondary | ICD-10-CM | POA: Diagnosis present

## 2021-05-11 DIAGNOSIS — N1832 Chronic kidney disease, stage 3b: Secondary | ICD-10-CM | POA: Diagnosis present

## 2021-05-11 DIAGNOSIS — J449 Chronic obstructive pulmonary disease, unspecified: Secondary | ICD-10-CM | POA: Diagnosis not present

## 2021-05-11 LAB — CBC WITH DIFFERENTIAL/PLATELET
Abs Immature Granulocytes: 0.07 10*3/uL (ref 0.00–0.07)
Basophils Absolute: 0.1 10*3/uL (ref 0.0–0.1)
Basophils Relative: 1 %
Eosinophils Absolute: 0.5 10*3/uL (ref 0.0–0.5)
Eosinophils Relative: 4 %
HCT: 35.8 % — ABNORMAL LOW (ref 39.0–52.0)
Hemoglobin: 11.9 g/dL — ABNORMAL LOW (ref 13.0–17.0)
Immature Granulocytes: 1 %
Lymphocytes Relative: 38 %
Lymphs Abs: 4.3 10*3/uL — ABNORMAL HIGH (ref 0.7–4.0)
MCH: 31.4 pg (ref 26.0–34.0)
MCHC: 33.2 g/dL (ref 30.0–36.0)
MCV: 94.5 fL (ref 80.0–100.0)
Monocytes Absolute: 1.3 10*3/uL — ABNORMAL HIGH (ref 0.1–1.0)
Monocytes Relative: 12 %
Neutro Abs: 5.2 10*3/uL (ref 1.7–7.7)
Neutrophils Relative %: 44 %
Platelets: 316 10*3/uL (ref 150–400)
RBC: 3.79 MIL/uL — ABNORMAL LOW (ref 4.22–5.81)
RDW: 13 % (ref 11.5–15.5)
WBC: 11.5 10*3/uL — ABNORMAL HIGH (ref 4.0–10.5)
nRBC: 0 % (ref 0.0–0.2)

## 2021-05-11 LAB — VANCOMYCIN, TROUGH: Vancomycin Tr: 14 ug/mL — ABNORMAL LOW (ref 15–20)

## 2021-05-11 LAB — COMPREHENSIVE METABOLIC PANEL
ALT: 15 U/L (ref 0–44)
AST: 14 U/L — ABNORMAL LOW (ref 15–41)
Albumin: 2.7 g/dL — ABNORMAL LOW (ref 3.5–5.0)
Alkaline Phosphatase: 71 U/L (ref 38–126)
Anion gap: 10 (ref 5–15)
BUN: 26 mg/dL — ABNORMAL HIGH (ref 8–23)
CO2: 26 mmol/L (ref 22–32)
Calcium: 9.3 mg/dL (ref 8.9–10.3)
Chloride: 100 mmol/L (ref 98–111)
Creatinine, Ser: 1.54 mg/dL — ABNORMAL HIGH (ref 0.61–1.24)
GFR, Estimated: 49 mL/min — ABNORMAL LOW (ref >60)
Glucose, Bld: 108 mg/dL — ABNORMAL HIGH (ref 70–99)
Potassium: 4.5 mmol/L (ref 3.5–5.1)
Sodium: 136 mmol/L (ref 135–145)
Total Bilirubin: 0.6 mg/dL (ref 0.3–1.2)
Total Protein: 6.1 g/dL — ABNORMAL LOW (ref 6.5–8.1)

## 2021-05-11 LAB — SEDIMENTATION RATE: Sed Rate: 69 mm/hr — ABNORMAL HIGH (ref 0–16)

## 2021-05-11 LAB — C-REACTIVE PROTEIN: CRP: 1.9 mg/dL — ABNORMAL HIGH (ref <1.0)

## 2021-05-12 ENCOUNTER — Encounter (HOSPITAL_COMMUNITY): Payer: Self-pay

## 2021-05-12 ENCOUNTER — Emergency Department (HOSPITAL_COMMUNITY): Payer: Medicare Other

## 2021-05-12 ENCOUNTER — Other Ambulatory Visit: Payer: Self-pay

## 2021-05-12 ENCOUNTER — Inpatient Hospital Stay (HOSPITAL_COMMUNITY)
Admission: EM | Admit: 2021-05-12 | Discharge: 2021-05-14 | DRG: 091 | Disposition: A | Discharge status: Home Health | Payer: Medicare Other | Attending: Internal Medicine | Admitting: Internal Medicine

## 2021-05-12 DIAGNOSIS — Z880 Allergy status to penicillin: Secondary | ICD-10-CM

## 2021-05-12 DIAGNOSIS — R4189 Other symptoms and signs involving cognitive functions and awareness: Secondary | ICD-10-CM | POA: Diagnosis present

## 2021-05-12 DIAGNOSIS — J439 Emphysema, unspecified: Secondary | ICD-10-CM | POA: Diagnosis present

## 2021-05-12 DIAGNOSIS — G009 Bacterial meningitis, unspecified: Secondary | ICD-10-CM | POA: Diagnosis present

## 2021-05-12 DIAGNOSIS — S22089D Unspecified fracture of T11-T12 vertebra, subsequent encounter for fracture with routine healing: Secondary | ICD-10-CM | POA: Diagnosis not present

## 2021-05-12 DIAGNOSIS — T361X5A Adverse effect of cephalosporins and other beta-lactam antibiotics, initial encounter: Secondary | ICD-10-CM | POA: Diagnosis present

## 2021-05-12 DIAGNOSIS — Z7952 Long term (current) use of systemic steroids: Secondary | ICD-10-CM

## 2021-05-12 DIAGNOSIS — Z8249 Family history of ischemic heart disease and other diseases of the circulatory system: Secondary | ICD-10-CM

## 2021-05-12 DIAGNOSIS — G928 Other toxic encephalopathy: Principal | ICD-10-CM | POA: Diagnosis present

## 2021-05-12 DIAGNOSIS — M549 Dorsalgia, unspecified: Secondary | ICD-10-CM | POA: Diagnosis present

## 2021-05-12 DIAGNOSIS — G0490 Encephalitis and encephalomyelitis, unspecified: Secondary | ICD-10-CM | POA: Diagnosis present

## 2021-05-12 DIAGNOSIS — R4587 Impulsiveness: Secondary | ICD-10-CM | POA: Diagnosis present

## 2021-05-12 DIAGNOSIS — J84112 Idiopathic pulmonary fibrosis: Secondary | ICD-10-CM | POA: Diagnosis present

## 2021-05-12 DIAGNOSIS — Z20822 Contact with and (suspected) exposure to covid-19: Secondary | ICD-10-CM | POA: Diagnosis present

## 2021-05-12 DIAGNOSIS — J9611 Chronic respiratory failure with hypoxia: Secondary | ICD-10-CM | POA: Diagnosis present

## 2021-05-12 DIAGNOSIS — Z841 Family history of disorders of kidney and ureter: Secondary | ICD-10-CM

## 2021-05-12 DIAGNOSIS — Z808 Family history of malignant neoplasm of other organs or systems: Secondary | ICD-10-CM

## 2021-05-12 DIAGNOSIS — Z885 Allergy status to narcotic agent status: Secondary | ICD-10-CM

## 2021-05-12 DIAGNOSIS — M1990 Unspecified osteoarthritis, unspecified site: Secondary | ICD-10-CM | POA: Diagnosis present

## 2021-05-12 DIAGNOSIS — G9341 Metabolic encephalopathy: Secondary | ICD-10-CM | POA: Diagnosis present

## 2021-05-12 DIAGNOSIS — R4182 Altered mental status, unspecified: Secondary | ICD-10-CM

## 2021-05-12 DIAGNOSIS — T8463XA Infection and inflammatory reaction due to internal fixation device of spine, initial encounter: Secondary | ICD-10-CM | POA: Diagnosis not present

## 2021-05-12 DIAGNOSIS — G039 Meningitis, unspecified: Secondary | ICD-10-CM | POA: Diagnosis not present

## 2021-05-12 DIAGNOSIS — S32031D Stable burst fracture of third lumbar vertebra, subsequent encounter for fracture with routine healing: Secondary | ICD-10-CM | POA: Diagnosis not present

## 2021-05-12 DIAGNOSIS — G8929 Other chronic pain: Secondary | ICD-10-CM | POA: Diagnosis present

## 2021-05-12 DIAGNOSIS — Z825 Family history of asthma and other chronic lower respiratory diseases: Secondary | ICD-10-CM

## 2021-05-12 DIAGNOSIS — M109 Gout, unspecified: Secondary | ICD-10-CM | POA: Diagnosis present

## 2021-05-12 DIAGNOSIS — A86 Unspecified viral encephalitis: Secondary | ICD-10-CM | POA: Diagnosis present

## 2021-05-12 DIAGNOSIS — F17210 Nicotine dependence, cigarettes, uncomplicated: Secondary | ICD-10-CM | POA: Diagnosis present

## 2021-05-12 DIAGNOSIS — E782 Mixed hyperlipidemia: Secondary | ICD-10-CM | POA: Diagnosis present

## 2021-05-12 DIAGNOSIS — Z79899 Other long term (current) drug therapy: Secondary | ICD-10-CM

## 2021-05-12 DIAGNOSIS — T8142XA Infection following a procedure, deep incisional surgical site, initial encounter: Secondary | ICD-10-CM | POA: Diagnosis not present

## 2021-05-12 DIAGNOSIS — M4626 Osteomyelitis of vertebra, lumbar region: Secondary | ICD-10-CM | POA: Diagnosis present

## 2021-05-12 DIAGNOSIS — J449 Chronic obstructive pulmonary disease, unspecified: Secondary | ICD-10-CM | POA: Diagnosis present

## 2021-05-12 DIAGNOSIS — I5032 Chronic diastolic (congestive) heart failure: Secondary | ICD-10-CM | POA: Diagnosis present

## 2021-05-12 DIAGNOSIS — N1832 Chronic kidney disease, stage 3b: Secondary | ICD-10-CM | POA: Diagnosis present

## 2021-05-12 LAB — URINALYSIS, ROUTINE W REFLEX MICROSCOPIC
Bilirubin Urine: NEGATIVE
Glucose, UA: NEGATIVE mg/dL
Ketones, ur: NEGATIVE mg/dL
Leukocytes,Ua: NEGATIVE
Nitrite: NEGATIVE
Protein, ur: 30 mg/dL — AB
Specific Gravity, Urine: 1.02 (ref 1.005–1.030)
pH: 6.5 (ref 5.0–8.0)

## 2021-05-12 LAB — COMPREHENSIVE METABOLIC PANEL
ALT: 16 U/L (ref 0–44)
AST: 12 U/L — ABNORMAL LOW (ref 15–41)
Albumin: 3 g/dL — ABNORMAL LOW (ref 3.5–5.0)
Alkaline Phosphatase: 70 U/L (ref 38–126)
Anion gap: 11 (ref 5–15)
BUN: 24 mg/dL — ABNORMAL HIGH (ref 8–23)
CO2: 26 mmol/L (ref 22–32)
Calcium: 9 mg/dL (ref 8.9–10.3)
Chloride: 98 mmol/L (ref 98–111)
Creatinine, Ser: 1.63 mg/dL — ABNORMAL HIGH (ref 0.61–1.24)
GFR, Estimated: 46 mL/min — ABNORMAL LOW (ref >60)
Glucose, Bld: 104 mg/dL — ABNORMAL HIGH (ref 70–99)
Potassium: 4.5 mmol/L (ref 3.5–5.1)
Sodium: 135 mmol/L (ref 135–145)
Total Bilirubin: 0.6 mg/dL (ref 0.3–1.2)
Total Protein: 6.7 g/dL (ref 6.5–8.1)

## 2021-05-12 LAB — RAPID URINE DRUG SCREEN, HOSP PERFORMED
Amphetamines: NOT DETECTED
Barbiturates: NOT DETECTED
Benzodiazepines: NOT DETECTED
Cocaine: NOT DETECTED
Opiates: POSITIVE — AB
Tetrahydrocannabinol: NOT DETECTED

## 2021-05-12 LAB — CBC
HCT: 34.2 % — ABNORMAL LOW (ref 39.0–52.0)
Hemoglobin: 11.5 g/dL — ABNORMAL LOW (ref 13.0–17.0)
MCH: 32.1 pg (ref 26.0–34.0)
MCHC: 33.6 g/dL (ref 30.0–36.0)
MCV: 95.5 fL (ref 80.0–100.0)
Platelets: 301 10*3/uL (ref 150–400)
RBC: 3.58 MIL/uL — ABNORMAL LOW (ref 4.22–5.81)
RDW: 13 % (ref 11.5–15.5)
WBC: 11.1 10*3/uL — ABNORMAL HIGH (ref 4.0–10.5)
nRBC: 0 % (ref 0.0–0.2)

## 2021-05-12 LAB — URINALYSIS, MICROSCOPIC (REFLEX)

## 2021-05-12 LAB — BLOOD GAS, VENOUS
Acid-Base Excess: 4.5 mmol/L — ABNORMAL HIGH (ref 0.0–2.0)
Bicarbonate: 27.6 mmol/L (ref 20.0–28.0)
Drawn by: 6352
FIO2: 28
O2 Saturation: 81.6 %
Patient temperature: 36.9
pCO2, Ven: 49 mmHg (ref 44.0–60.0)
pH, Ven: 7.393 (ref 7.250–7.430)
pO2, Ven: 50 mmHg — ABNORMAL HIGH (ref 32.0–45.0)

## 2021-05-12 LAB — PROTIME-INR
INR: 1.1 (ref 0.8–1.2)
Prothrombin Time: 14.3 seconds (ref 11.4–15.2)

## 2021-05-12 LAB — CBG MONITORING, ED: Glucose-Capillary: 107 mg/dL — ABNORMAL HIGH (ref 70–99)

## 2021-05-12 LAB — ETHANOL: Alcohol, Ethyl (B): <10 mg/dL (ref <10)

## 2021-05-12 LAB — LACTIC ACID, PLASMA: Lactic Acid, Venous: 1 mmol/L (ref 0.5–1.9)

## 2021-05-12 LAB — VANCOMYCIN, TROUGH: Vancomycin Tr: 17 ug/mL (ref 15–20)

## 2021-05-12 MED ORDER — LORAZEPAM 2 MG/ML IJ SOLN
INTRAMUSCULAR | Status: AC
  Administered 2021-05-12: 1 mg via INTRAVENOUS
  Filled 2021-05-12: qty 1

## 2021-05-12 MED ORDER — LORAZEPAM 2 MG/ML IJ SOLN: 1.0000 mg | Freq: Once | INTRAMUSCULAR | Status: AC

## 2021-05-12 MED ORDER — SODIUM CHLORIDE 0.9 % IV SOLN
2.0000 g | Freq: Two times a day (BID) | INTRAVENOUS | Status: DC
  Administered 2021-05-12: 23:00:00 2 g via INTRAVENOUS
  Filled 2021-05-12: qty 2
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
## 2021-05-12 NOTE — Progress Notes (Signed)
Pharmacy Antibiotic Note  Jordan May is a 65 y.o. male admitted on 05/12/2021 with AMS, to continue outpatient IV ABX for  osteomyelitis .  Pharmacy has been consulted for vancomycin and cefepime dosing.  Plan: Will obtain random vanc level with am labs. Cefepime 2g IV Q12H.  Height: 5\' 8"  (172.7 cm) Weight: 77.6 kg (171 lb) IBW/kg (Calculated) : 68.4  Temp (24hrs), Avg:98.3 F (36.8 C), Min:98.3 F (36.8 C), Max:98.3 F (36.8 C)  Recent Labs  Lab 05/11/21 0905 05/11/21 0906 05/12/21 2153  WBC 11.5*  --  11.1*  CREATININE 1.54*  --  1.63*  LATICACIDVEN  --   --  1.0  VANCOTROUGH  --  14* 17    Estimated Creatinine Clearance: 42.5 mL/min (A) (by C-G formula based on SCr of 1.63 mg/dL (H)).    Allergies  Allergen Reactions   Gabapentin Other (See Comments)    hallucinates hallucinates   Penicillins Other (See Comments)    Unknown reaction Childhood allergy    Oxycodone Other (See Comments)    hallucinations Hallucinations 10/9 @1936  patient said he hs no allergies to Oxycodone   Penicillamine Other (See Comments)    Thank you for allowing pharmacy to be a part of this patient's care.  Wynona Neat, PharmD, BCPS  05/12/2021 10:59 PM

## 2021-05-12 NOTE — ED Notes (Signed)
Per edp, stopped cefepime. Edp to consult with pharmacy

## 2021-05-13 ENCOUNTER — Encounter (HOSPITAL_COMMUNITY): Payer: Self-pay | Admitting: Internal Medicine

## 2021-05-13 ENCOUNTER — Inpatient Hospital Stay (HOSPITAL_COMMUNITY): Payer: Medicare Other

## 2021-05-13 DIAGNOSIS — M549 Dorsalgia, unspecified: Secondary | ICD-10-CM | POA: Diagnosis present

## 2021-05-13 DIAGNOSIS — Z841 Family history of disorders of kidney and ureter: Secondary | ICD-10-CM | POA: Diagnosis not present

## 2021-05-13 DIAGNOSIS — E782 Mixed hyperlipidemia: Secondary | ICD-10-CM

## 2021-05-13 DIAGNOSIS — G9341 Metabolic encephalopathy: Secondary | ICD-10-CM | POA: Diagnosis present

## 2021-05-13 DIAGNOSIS — R4182 Altered mental status, unspecified: Secondary | ICD-10-CM | POA: Diagnosis present

## 2021-05-13 DIAGNOSIS — A86 Unspecified viral encephalitis: Secondary | ICD-10-CM

## 2021-05-13 DIAGNOSIS — J84112 Idiopathic pulmonary fibrosis: Secondary | ICD-10-CM | POA: Diagnosis present

## 2021-05-13 DIAGNOSIS — G8929 Other chronic pain: Secondary | ICD-10-CM | POA: Diagnosis present

## 2021-05-13 DIAGNOSIS — G0490 Encephalitis and encephalomyelitis, unspecified: Secondary | ICD-10-CM | POA: Diagnosis present

## 2021-05-13 DIAGNOSIS — J439 Emphysema, unspecified: Secondary | ICD-10-CM | POA: Diagnosis present

## 2021-05-13 DIAGNOSIS — I5032 Chronic diastolic (congestive) heart failure: Secondary | ICD-10-CM | POA: Diagnosis present

## 2021-05-13 DIAGNOSIS — M4626 Osteomyelitis of vertebra, lumbar region: Secondary | ICD-10-CM | POA: Diagnosis present

## 2021-05-13 DIAGNOSIS — N1832 Chronic kidney disease, stage 3b: Secondary | ICD-10-CM | POA: Diagnosis present

## 2021-05-13 DIAGNOSIS — Z880 Allergy status to penicillin: Secondary | ICD-10-CM | POA: Diagnosis not present

## 2021-05-13 DIAGNOSIS — R519 Headache, unspecified: Secondary | ICD-10-CM | POA: Diagnosis not present

## 2021-05-13 DIAGNOSIS — Z79899 Other long term (current) drug therapy: Secondary | ICD-10-CM | POA: Diagnosis not present

## 2021-05-13 DIAGNOSIS — J9611 Chronic respiratory failure with hypoxia: Secondary | ICD-10-CM | POA: Diagnosis present

## 2021-05-13 DIAGNOSIS — M1990 Unspecified osteoarthritis, unspecified site: Secondary | ICD-10-CM | POA: Diagnosis present

## 2021-05-13 DIAGNOSIS — G928 Other toxic encephalopathy: Secondary | ICD-10-CM | POA: Diagnosis present

## 2021-05-13 DIAGNOSIS — J449 Chronic obstructive pulmonary disease, unspecified: Secondary | ICD-10-CM

## 2021-05-13 DIAGNOSIS — F17210 Nicotine dependence, cigarettes, uncomplicated: Secondary | ICD-10-CM | POA: Diagnosis present

## 2021-05-13 DIAGNOSIS — Z7952 Long term (current) use of systemic steroids: Secondary | ICD-10-CM | POA: Diagnosis not present

## 2021-05-13 DIAGNOSIS — G009 Bacterial meningitis, unspecified: Secondary | ICD-10-CM

## 2021-05-13 DIAGNOSIS — Z885 Allergy status to narcotic agent status: Secondary | ICD-10-CM | POA: Diagnosis not present

## 2021-05-13 DIAGNOSIS — Z8249 Family history of ischemic heart disease and other diseases of the circulatory system: Secondary | ICD-10-CM | POA: Diagnosis not present

## 2021-05-13 DIAGNOSIS — Z825 Family history of asthma and other chronic lower respiratory diseases: Secondary | ICD-10-CM | POA: Diagnosis not present

## 2021-05-13 DIAGNOSIS — Z20822 Contact with and (suspected) exposure to covid-19: Secondary | ICD-10-CM | POA: Diagnosis present

## 2021-05-13 DIAGNOSIS — M109 Gout, unspecified: Secondary | ICD-10-CM | POA: Diagnosis present

## 2021-05-13 DIAGNOSIS — Z808 Family history of malignant neoplasm of other organs or systems: Secondary | ICD-10-CM | POA: Diagnosis not present

## 2021-05-13 DIAGNOSIS — T361X5A Adverse effect of cephalosporins and other beta-lactam antibiotics, initial encounter: Secondary | ICD-10-CM | POA: Diagnosis present

## 2021-05-13 LAB — COMPREHENSIVE METABOLIC PANEL
ALT: 13 U/L (ref 0–44)
AST: 11 U/L — ABNORMAL LOW (ref 15–41)
Albumin: 2.9 g/dL — ABNORMAL LOW (ref 3.5–5.0)
Alkaline Phosphatase: 67 U/L (ref 38–126)
Anion gap: 9 (ref 5–15)
BUN: 23 mg/dL (ref 8–23)
CO2: 27 mmol/L (ref 22–32)
Calcium: 9.1 mg/dL (ref 8.9–10.3)
Chloride: 100 mmol/L (ref 98–111)
Creatinine, Ser: 1.53 mg/dL — ABNORMAL HIGH (ref 0.61–1.24)
GFR, Estimated: 50 mL/min — ABNORMAL LOW (ref >60)
Glucose, Bld: 88 mg/dL (ref 70–99)
Potassium: 4.2 mmol/L (ref 3.5–5.1)
Sodium: 136 mmol/L (ref 135–145)
Total Bilirubin: 0.6 mg/dL (ref 0.3–1.2)
Total Protein: 6.3 g/dL — ABNORMAL LOW (ref 6.5–8.1)

## 2021-05-13 LAB — CBC WITH DIFFERENTIAL/PLATELET
Abs Immature Granulocytes: 0.05 10*3/uL (ref 0.00–0.07)
Basophils Absolute: 0.1 10*3/uL (ref 0.0–0.1)
Basophils Relative: 1 %
Eosinophils Absolute: 0.3 10*3/uL (ref 0.0–0.5)
Eosinophils Relative: 2 %
HCT: 33.4 % — ABNORMAL LOW (ref 39.0–52.0)
Hemoglobin: 10.8 g/dL — ABNORMAL LOW (ref 13.0–17.0)
Immature Granulocytes: 0 %
Lymphocytes Relative: 29 %
Lymphs Abs: 3.5 10*3/uL (ref 0.7–4.0)
MCH: 31 pg (ref 26.0–34.0)
MCHC: 32.3 g/dL (ref 30.0–36.0)
MCV: 96 fL (ref 80.0–100.0)
Monocytes Absolute: 1.6 10*3/uL — ABNORMAL HIGH (ref 0.1–1.0)
Monocytes Relative: 13 %
Neutro Abs: 6.6 10*3/uL (ref 1.7–7.7)
Neutrophils Relative %: 55 %
Platelets: 308 10*3/uL (ref 150–400)
RBC: 3.48 MIL/uL — ABNORMAL LOW (ref 4.22–5.81)
RDW: 13 % (ref 11.5–15.5)
WBC: 12.1 10*3/uL — ABNORMAL HIGH (ref 4.0–10.5)
nRBC: 0 % (ref 0.0–0.2)

## 2021-05-13 LAB — RESP PANEL BY RT-PCR (FLU A&B, COVID) ARPGX2
Influenza A by PCR: NEGATIVE
Influenza B by PCR: NEGATIVE
SARS Coronavirus 2 by RT PCR: NEGATIVE

## 2021-05-13 LAB — FOLATE: Folate: 4.7 ng/mL — ABNORMAL LOW (ref >5.9)

## 2021-05-13 LAB — RPR: RPR Ser Ql: NONREACTIVE

## 2021-05-13 LAB — VANCOMYCIN, RANDOM: Vancomycin Rm: 16

## 2021-05-13 LAB — SEDIMENTATION RATE: Sed Rate: 59 mm/hr — ABNORMAL HIGH (ref 0–16)

## 2021-05-13 LAB — LACTIC ACID, PLASMA: Lactic Acid, Venous: 0.8 mmol/L (ref 0.5–1.9)

## 2021-05-13 LAB — TSH: TSH: 0.612 u[IU]/mL (ref 0.350–4.500)

## 2021-05-13 LAB — VANCOMYCIN, TROUGH: Vancomycin Tr: 12 ug/mL — ABNORMAL LOW (ref 15–20)

## 2021-05-13 LAB — HIV ANTIBODY (ROUTINE TESTING W REFLEX): HIV Screen 4th Generation wRfx: NONREACTIVE

## 2021-05-13 LAB — AMMONIA: Ammonia: 24 umol/L (ref 9–35)

## 2021-05-13 LAB — MAGNESIUM: Magnesium: 2.1 mg/dL (ref 1.7–2.4)

## 2021-05-13 LAB — VITAMIN B12: Vitamin B-12: 217 pg/mL (ref 180–914)

## 2021-05-13 LAB — C-REACTIVE PROTEIN: CRP: 1.1 mg/dL — ABNORMAL HIGH (ref <1.0)

## 2021-05-13 MED ORDER — VITAMIN B-12 100 MCG PO TABS
500.0000 ug | ORAL_TABLET | Freq: Every day | ORAL | Status: DC
  Administered 2021-05-14: 500 ug via ORAL
  Filled 2021-05-13: qty 5

## 2021-05-13 MED ORDER — COLCHICINE 0.6 MG PO TABS: 0.6000 mg | ORAL_TABLET | Freq: Two times a day (BID) | ORAL | Status: DC

## 2021-05-13 MED ORDER — SIMVASTATIN 10 MG PO TABS
10.0000 mg | ORAL_TABLET | Freq: Every day | ORAL | Status: DC
  Administered 2021-05-13: 10 mg via ORAL
  Filled 2021-05-13: qty 1

## 2021-05-13 MED ORDER — VANCOMYCIN HCL 1250 MG/250ML IV SOLN: 1250.0000 mg | INTRAVENOUS | Status: DC

## 2021-05-13 MED ORDER — CHLORHEXIDINE GLUCONATE CLOTH 2 % EX PADS
6.0000 | MEDICATED_PAD | Freq: Every day | CUTANEOUS | Status: DC
  Administered 2021-05-14: 6 via TOPICAL

## 2021-05-13 MED ORDER — SODIUM CHLORIDE 0.9 % IV SOLN
2.0000 g | Freq: Two times a day (BID) | INTRAVENOUS | Status: DC
  Administered 2021-05-13 – 2021-05-14 (×3): 2 g via INTRAVENOUS
  Filled 2021-05-13 (×5): qty 2

## 2021-05-13 MED ORDER — PREDNISONE 20 MG PO TABS
10.0000 mg | ORAL_TABLET | Freq: Every day | ORAL | Status: DC
  Administered 2021-05-13 – 2021-05-14 (×2): 10 mg via ORAL
  Filled 2021-05-13 (×2): qty 1

## 2021-05-13 MED ORDER — FOLIC ACID 5 MG/ML IJ SOLN
1.0000 mg | Freq: Once | INTRAMUSCULAR | Status: AC
  Administered 2021-05-13: 1 mg via INTRAVENOUS
  Filled 2021-05-13: qty 0.2

## 2021-05-13 MED ORDER — HYDROCODONE-ACETAMINOPHEN 10-325 MG PO TABS
1.0000 | ORAL_TABLET | Freq: Four times a day (QID) | ORAL | Status: DC | PRN
  Administered 2021-05-13: 1 via ORAL
  Filled 2021-05-13 (×2): qty 1

## 2021-05-13 MED ORDER — POLYETHYLENE GLYCOL 3350 17 G PO PACK: 17.0000 g | PACK | Freq: Every day | ORAL | Status: DC | PRN

## 2021-05-13 MED ORDER — ALBUTEROL SULFATE (2.5 MG/3ML) 0.083% IN NEBU: 2.5000 mg | INHALATION_SOLUTION | RESPIRATORY_TRACT | Status: DC | PRN

## 2021-05-13 MED ORDER — VANCOMYCIN HCL 500 MG IV SOLR
INTRAVENOUS | Status: AC
  Filled 2021-05-13: qty 10

## 2021-05-13 MED ORDER — ENOXAPARIN SODIUM 40 MG/0.4ML IJ SOSY
40.0000 mg | PREFILLED_SYRINGE | INTRAMUSCULAR | Status: DC
  Administered 2021-05-13 – 2021-05-14 (×2): 40 mg via SUBCUTANEOUS
  Filled 2021-05-13 (×3): qty 0.4

## 2021-05-13 MED ORDER — VANCOMYCIN HCL 10 G IV SOLR
250.0000 mg | Freq: Once | INTRAVENOUS | Status: AC
  Administered 2021-05-13: 250 mg via INTRAVENOUS
  Filled 2021-05-13: qty 2.5

## 2021-05-13 MED ORDER — FOLIC ACID 1 MG PO TABS
1.0000 mg | ORAL_TABLET | Freq: Every day | ORAL | Status: DC
  Administered 2021-05-14: 1 mg via ORAL
  Filled 2021-05-13: qty 1

## 2021-05-13 MED ORDER — VANCOMYCIN HCL 1000 MG/200ML IV SOLN
1000.0000 mg | INTRAVENOUS | Status: DC
  Administered 2021-05-13: 1000 mg via INTRAVENOUS
  Filled 2021-05-13: qty 200

## 2021-05-13 MED ORDER — ACETAMINOPHEN 325 MG PO TABS
650.0000 mg | ORAL_TABLET | Freq: Four times a day (QID) | ORAL | Status: DC | PRN
  Administered 2021-05-14: 650 mg via ORAL
  Filled 2021-05-13: qty 2

## 2021-05-13 MED ORDER — ACETAMINOPHEN 650 MG RE SUPP: 650.0000 mg | Freq: Four times a day (QID) | RECTAL | Status: DC | PRN

## 2021-05-13 MED ORDER — SODIUM CHLORIDE 0.9 % IV SOLN: 1500.0000 mg | Freq: Two times a day (BID) | INTRAVENOUS | Status: DC

## 2021-05-13 MED ORDER — HYDROCODONE-ACETAMINOPHEN 10-325 MG PO TABS
2.0000 | ORAL_TABLET | Freq: Four times a day (QID) | ORAL | Status: DC | PRN
  Administered 2021-05-13 – 2021-05-14 (×5): 2 via ORAL
  Filled 2021-05-13 (×5): qty 2

## 2021-05-13 MED ORDER — ONDANSETRON HCL 4 MG/2ML IJ SOLN: 4.0000 mg | Freq: Four times a day (QID) | INTRAMUSCULAR | Status: DC | PRN

## 2021-05-13 MED ORDER — CYANOCOBALAMIN 1000 MCG/ML IJ SOLN
1000.0000 ug | Freq: Once | INTRAMUSCULAR | Status: AC
  Administered 2021-05-13: 1000 ug via INTRAMUSCULAR
  Filled 2021-05-13: qty 1

## 2021-05-13 MED ORDER — GADOBUTROL 1 MMOL/ML IV SOLN
7.5000 mL | Freq: Once | INTRAVENOUS | Status: AC | PRN
  Administered 2021-05-13: 7.5 mL via INTRAVENOUS

## 2021-05-13 MED ORDER — VANCOMYCIN VARIABLE DOSE PER UNSTABLE RENAL FUNCTION (PHARMACIST DOSING): Status: DC

## 2021-05-13 MED ORDER — SODIUM CHLORIDE 0.9 % IV SOLN: 1.0000 mg | Freq: Once | INTRAVENOUS | Status: DC

## 2021-05-13 MED ORDER — ONDANSETRON HCL 4 MG PO TABS: 4.0000 mg | ORAL_TABLET | Freq: Four times a day (QID) | ORAL | Status: DC | PRN

## 2021-05-13 NOTE — Progress Notes (Signed)
Pharmacy Antibiotic Note  Jordan May is a 65 y.o. male admitted on 05/12/2021 with AMS, to continue outpatient IV ABX (cefepime and vancomycin for  osteomyelitis (end date originally 12/7) . Changing Cefepime to Meropenem for r/o meningitis.  Pharmacy has been consulted for vancomycin and meropenem dosing.  Pt was on Vancomycin 1gm IV q48h as o/p - last dose given 1500 11/28. (Per [facility] infusion pharmacy records)  Vanc random 17 mcg/ml (drawn about 29 hrs post last dose) Another random level 16 mcg/ml (drawn 3 hours later)  Plan: Meropenem 2gm IV q12h Will get true vancomycin trough today at 1600 to evaluate o/p vancomycin dosing   Height: 5\' 8"  (172.7 cm) Weight: 77.6 kg (171 lb) IBW/kg (Calculated) : 68.4  Temp (24hrs), Avg:98.3 F (36.8 C), Min:98.3 F (36.8 C), Max:98.3 F (36.8 C)  Recent Labs  Lab 05/11/21 0905 05/11/21 0906 05/12/21 2153 05/13/21 0010 05/13/21 0116  WBC 11.5*  --  11.1*  --   --   CREATININE 1.54*  --  1.63*  --   --   LATICACIDVEN  --   --  1.0 0.8  --   VANCOTROUGH  --  14* 17  --   --   VANCORANDOM  --   --   --   --  16     Estimated Creatinine Clearance: 42.5 mL/min (A) (by C-G formula based on SCr of 1.63 mg/dL (H)).    Allergies  Allergen Reactions   Gabapentin Other (See Comments)    hallucinates hallucinates   Penicillins Other (See Comments)    Unknown reaction Childhood allergy    Oxycodone Other (See Comments)    hallucinations Hallucinations 10/9 @1936  patient said he hs no allergies to Oxycodone   Penicillamine Other (See Comments)    Thank you for allowing pharmacy to be a part of this patient's care.  Sherlon Handing, PharmD, BCPS Please see amion for complete clinical pharmacist phone list 05/13/2021 2:28 AM

## 2021-05-13 NOTE — ED Notes (Signed)
Pt given graham crackers and ginger ale ?

## 2021-05-13 NOTE — Assessment & Plan Note (Signed)
·   Please see assessment and plan above °

## 2021-05-13 NOTE — Progress Notes (Addendum)
Pharmacy Antibiotic Note  Jordan May is a 65 y.o. male admitted on 05/12/2021 with acute metabolic encephalopathy, to continue outpatient IV ABX (cefepime and vancomycin for  osteomyelitis , bacterial meningitis/ ventriculitis (end date originally 12/7) ).  Cefepime was changed to meropenem for R/O meningitis.  Pharmacy was consulted for vancomycin and meropenem dosing.  Pt was on Vancomycin 1 gm IV Q 48 hrs as outpatient - last dose was given at 1500 11/28, per [facility] infusion pharmacy records.  Vanc random 17 mg/L (drawn about 31 hrs post last dose) Another random level drawn ~3 hrs later was 16 mg/L Next random vancomycin level drawn ~48 hrs after last dose was 12 mg/L.   WBC 12.1, afebrile; Scr 1.53, CrCl 45.3 mL/min (renal function ~stable)  Plan: Meropenem 2 gm IV Q 12 hrs Pt rec'd vancomycin 1 gm IV X 1 at 1737 PM today (11/30) while waiting for vanc trough to result; given concern for meningitis/ventriculitis, want vancomycin trough in 15-20 mg/L range (closer to 20 mg/L); increase vancomycin to 1250 mg IV Q 48 hrs (give additional 250 mg dose to make total dose of 1250 mg IV today) Monitor WBC, temp, clinical course, cultures, renal function, vancomycin levels  Height: 5\' 8"  (172.7 cm) Weight: 77.7 kg (171 lb 4.8 oz) IBW/kg (Calculated) : 68.4  Temp (24hrs), Avg:98.1 F (36.7 C), Min:97.7 F (36.5 C), Max:98.3 F (36.8 C)  Recent Labs  Lab 05/11/21 0905 05/11/21 0906 05/12/21 2153 05/13/21 0010 05/13/21 0110 05/13/21 0116 05/13/21 1614  WBC 11.5*  --  11.1*  --  12.1*  --   --   CREATININE 1.54*  --  1.63*  --  1.53*  --   --   LATICACIDVEN  --   --  1.0 0.8  --   --   --   VANCOTROUGH  --    < > 17  --   --   --  12*  VANCORANDOM  --   --   --   --   --  16  --    < > = values in this interval not displayed.     Estimated Creatinine Clearance: 45.3 mL/min (A) (by C-G formula based on SCr of 1.53 mg/dL (H)).    Allergies  Allergen Reactions    Gabapentin Other (See Comments)    hallucinates hallucinates   Penicillins Other (See Comments)    Unknown reaction Childhood allergy    Oxycodone Other (See Comments)    hallucinations Hallucinations 10/9 @1936  patient said he hs no allergies to Oxycodone   Penicillamine Other (See Comments)    Antimicrobials This Admission: Cefepime 10/11 on PTA > 11/30 Meropenem 11/30 >> Vancomycin 10/11 on PTA >>  Microbiology Data: 11/30 COVID, flu A, flu B, HIV screen: negative 11/29 Bld cx X 1: NGTD  Thank you for allowing pharmacy to be a part of this patient's care.  Gillermina Hu, PharmD, BCPS, [facility] Clinical Pharmacist 05/13/2021 5:39 PM

## 2021-05-13 NOTE — Assessment & Plan Note (Signed)
   Patient has been switched to intravenous meropenem alongside intravenous vancomycin  Remainder of assessment and plan as above

## 2021-05-13 NOTE — Assessment & Plan Note (Signed)
.   Continuing home regimen of lipid lowering therapy.  

## 2021-05-13 NOTE — Assessment & Plan Note (Signed)
   No evidence of COPD exacerbation this time  As needed bronchodilator therapy for episodic shortness of breath and wheezing.  

## 2021-05-13 NOTE — Assessment & Plan Note (Signed)
•   No clinical evidence of cardiogenic volume overload ° °

## 2021-05-13 NOTE — Progress Notes (Signed)
Pharmacy Antibiotic Note  Jordan May is a 65 y.o. male admitted on 05/12/2021 with AMS, to continue outpatient IV ABX (cefepime and vancomycin for  osteomyelitis (end date originally 12/7) . Changing Cefepime to Meropenem for r/o meningitis.  Pharmacy has been consulted for vancomycin and meropenem dosing.  Pt was on Vancomycin 1gm IV q48h as o/p - last dose given 1500 11/28. (Per [facility] infusion pharmacy records)  Vanc random 17 mcg/ml (drawn about 29 hrs post last dose) Another random level 16 mcg/ml (drawn 3 hours later) 11/30 1700 Issues with lab for VT, safe to infuse 1gm IV. Will continue current dose to not delay therapy  Plan: Meropenem 2gm IV q12h Vancomycin 1gm IV q48h F/u cxs and clinical progress Monitor V/S, labs and levels as indicated  Height: 5\' 8"  (172.7 cm) Weight: 77.7 kg (171 lb 4.8 oz) IBW/kg (Calculated) : 68.4  Temp (24hrs), Avg:98.1 F (36.7 C), Min:97.7 F (36.5 C), Max:98.3 F (36.8 C)  Recent Labs  Lab 05/11/21 0905 05/11/21 0906 05/12/21 2153 05/13/21 0010 05/13/21 0110 05/13/21 0116  WBC 11.5*  --  11.1*  --  12.1*  --   CREATININE 1.54*  --  1.63*  --  1.53*  --   LATICACIDVEN  --   --  1.0 0.8  --   --   VANCOTROUGH  --  14* 17  --   --   --   VANCORANDOM  --   --   --   --   --  16     Estimated Creatinine Clearance: 45.3 mL/min (A) (by C-G formula based on SCr of 1.53 mg/dL (H)).    Allergies  Allergen Reactions   Gabapentin Other (See Comments)    hallucinates hallucinates   Penicillins Other (See Comments)    Unknown reaction Childhood allergy    Oxycodone Other (See Comments)    hallucinations Hallucinations 10/9 @1936  patient said he hs no allergies to Oxycodone   Penicillamine Other (See Comments)   Antibiotics: Cefepime 10/11 on PTA> 11/30 Merrem 11/30>> Vancomycin 10/11 on PTA>>  Cultures: 11/29 BCX: ngtd  Thank you for allowing pharmacy to be a part of this patient's care.  Isac Sarna, BS [place],  [place] Clinical Pharmacist Pager [phone] 05/13/2021 4:55 PM

## 2021-05-13 NOTE — Assessment & Plan Note (Addendum)
   Patient presenting with 3-week history of gradually progressive confusion and disorientation with 3 to 4-day history of particularly severe aggression and change in behavior.  Etiology is not entirely clear at this time, performing extensive work-up in this patient with recent CNS infection  We must determine as to whether patient's osteomyelitis/meningitis/ventriculitis are worsening.  Obtaining inflammatory markers, blood cultures.   MRI brain with contrast later today considering patient's 3-day history of severe headaches according to the wife.  On physical examination, neck is supple and back is nontender with dressing clean and dry and low back surgical wound not revealing any evidence of active infection  Obtaining standard encephalopathy work-up including electrolytes, TSH, B12, folate, LFTs all of this portion of the work-up is likely to be unremarkable  Finally, if all the work-up above ends up being negative it is quite possible that the patient's longstanding use of cefepime could be the culprit as this medication can cause confusion with some significant frequency.  To address this possibility I have discontinued cefepime and instead I am treating patient with scheduled meropenem instead  Patient would likely benefit from infectious disease consultation on day shift prior to discharge

## 2021-05-13 NOTE — Progress Notes (Signed)
PROGRESS NOTE  Jordan May FIE:[id number] DOB: 06-14-1956 DOA: 05/12/2021 PCP: Redmond School, MD  Brief History:  65 year old male with a history of COPD, pulmonary fibrosis, chronic respiratory failure on 2 L, hyperlipidemia, and chronic back pain presented with increasing confusion and aggressive behavior. The patient has a rather complicated history with recent admissions to the hospital at [facility].  Notably, the patient was admitted to [facility] from 03/10/2021 to 03/23/2021 when he fell off a tractor resulting in L3 burst fracture with adjacent epidural hematoma as well as a T12 fracture.  He was taken to the OR on 03/03/2021 for L2-3 laminectomy and foraminotomy, repair of dural CSF leak and a posterior lateral L2-L4 fusion.  Postoperatively, he developed an ileus which required NG decompression as well as uncontrolled pain.  He gradually improved and was subsequently discharged to inpatient rehab on 03/23/2021.  On 03/24/2021, rapid response was called and the patient was found to be lethargic and tachycardic and hypoxic.  He was initially started on clindamycin by rehabilitation medicine.  The patient was transferred back to acute hospital setting and treated for sepsis which likely included infection of his hardware, UTI, and pneumonia.  MRI of the brain and MRI of the lumbar spine showed concerning findings for purulen material layering in the occipital horns and lateral ventricles with mild communicating hydrocephalus and leptomeningeal enhancement concerning for bacterial meningitis.  There was also infectious osteomyelitis of T12 and L3 vertebral bodies without evidence of epidural abscess.  There is also concern for surgical site and infection.  Marland Kitchen MRI of T and C-spine revealed nonspecific nodular enhancement in the posterior aspect of C7-T1 interspinous interval less likely concerning for osteomyelitis.  IR subsequently performed an LP.  Meningitis panel was negative.  Cultures are  negative.  CSF showed 173 WBC, glucose 15, protein 132.  The patient was discharged with vancomycin, cefepime, and metronidazole with the end date of 05/20/2021.  It was noted during his hospitalization that he had encephalopathy and agitation that was managed with Ativan as well as Seroquel twice daily 25 mg.  It was noted that the patient did demonstrate some cognitive as manifested by intermittent impulsive behavior as well as impulsiveness.  He was subsequently discharged home on 04/08/2021 with home health.  Since discharge from the hospital, the wife stated that the patient has become progressively more forgetful and "not his usual self".  In the last 3 to 4 days, the patient has exhibited increasing agitation and aggressive behavior.  He has been complained of headaches for the past 3 days but denies photophobia, neck pain, or neck stiffness.  There is no focal extremity weakness  Upon evaluation in the emergency department patient was continued to exhibit waxing and waning confusion.  Patient was administered a single dose of 1 mg of intravenous Ativan.  Chest x-ray revealed no evidence of pneumonia.  VBG was unremarkable.  CT imaging of the head was not performed due to unavailability of CT imaging right now.  Hospitalist group was then called to assess patient for admission the hospital for continued confusion.  Assessment/Plan: Acute metabolic encephalopathy -Etiology unclear however concern is for medication for secondary to her cefepime and metronidazole as possible opioids -Serum B12--217 (low normal) -Folic acid 4.7 (low) -TSH 0.612 -Ammonia--24 -UA negative for pyuria -Doubt worsening infection as the patient's ESR has gradually decreased in the past 2 weeks 80>>70>59 and patient has been afebrile -UDS--positive opiate -Follow-up MRI brain -Personally reviewed  chest x-ray--negative for pyuria -No signs of surgical site infection or meningismus -11/30>> patient is alert and  oriented x3 -It was noted during his hospitalizations at [facility] that he had some cognitive deficits as manifested by intermittent impulsive behavior -Plan to repeat B12 as it is low normal as well as supplement folate -also patient noted to have some cognitive deficits/decline endorsed by spouse which may represent some progression of underlying memory--"got his days/nights" mixed up since come home from [facility]  Bacterial meningitis and ventriculitis -Patient has been on vancomycin and cefepime since 03/24/2021 and metronidazole since 03/28/2021 -Cefepime and metronidazole have been discontinued with mild improvement of his mental status -Continue meropenem -Tentative stop date of his antibiotics 05/20/2021  Acute osteomyelitis lumbar spine -Continue vancomycin and meropenem for now -ESR trending down -He had previous MRI finding of osteomyelitis of T12 and L3  CKD stage IIIb -Baseline creatinine 1.5-1.7  Pulmonary fibrosis -Stable on room air  COPD -Stable -Continue as needed bronchodilator therapy  Chronic diastolic CHF -Clinically euvolemic  Hyperlipidemia -Continue statin      Status is: Inpatient  Remains inpatient appropriate because: altered mental status        Family Communication:   spouse updated 11/30  Consultants:  none  Code Status:  FULL   DVT Prophylaxis:  Borden Lovenox   Procedures: As Listed in Progress Note Above  Antibiotics: As above   Total time spent 45 minutes.  Greater than 50% spent face to face counseling and coordinating care.    Subjective: Patient denies fevers, chills, headache, chest pain, dyspnea, nausea, vomiting, diarrhea, abdominal pain, dysuria, hematuria, hematochezia, and melena. Denies worsening back pain  Objective: Vitals:   05/12/21 2315 05/13/21 0130 05/13/21 0300 05/13/21 0337  BP: (!) 149/89  118/66 (!) 162/84  Pulse: 84 74 71 84  Resp: 20 16 14 20   Temp:    97.7 F (36.5 C)  TempSrc:    Oral   SpO2: 95% 97% 96% 98%  Weight:    77.7 kg  Height:    5' 8"  (1.727 m)    Intake/Output Summary (Last 24 hours) at 05/13/2021 7209 Last data filed at 05/13/2021 0500 Gross per 24 hour  Intake 273.42 ml  Output --  Net 273.42 ml   Weight change:  Exam:  General:  Pt is alert, follows commands appropriately, not in acute distress HEENT: No icterus, No thrush, No neck mass, [place]/AT Cardiovascular: RRR, S1/S2, no rubs, no gallops Respiratory: diminshed breath sounds.  Scattered rales Abdomen: Soft/+BS, non tender, non distended, no guarding Extremities: No edema, No lymphangitis, No petechiae, No rashes, no synovitis Neuro:  CN II-XII intact, strength 4/5 in RUE, RLE, strength 4/5 LUE, LLE; sensation intact bilateral; no dysmetria; babinski equivocal    Data Reviewed: I have personally reviewed following labs and imaging studies Basic Metabolic Panel: Recent Labs  Lab 05/11/21 0905 05/12/21 2153 05/13/21 0110  NA 136 135 136  K 4.5 4.5 4.2  CL 100 98 100  CO2 26 26 27   GLUCOSE 108* 104* 88  BUN 26* 24* 23  CREATININE 1.54* 1.63* 1.53*  CALCIUM 9.3 9.0 9.1  MG  --   --  2.1   Liver Function Tests: Recent Labs  Lab 05/11/21 0905 05/12/21 2153 05/13/21 0110  AST 14* 12* 11*  ALT 15 16 13   ALKPHOS 71 70 67  BILITOT 0.6 0.6 0.6  PROT 6.1* 6.7 6.3*  ALBUMIN 2.7* 3.0* 2.9*   No results for input(s): LIPASE, AMYLASE in the last  168 hours. Recent Labs  Lab 05/13/21 0116  AMMONIA 24   Coagulation Profile: Recent Labs  Lab 05/12/21 2153  INR 1.1   CBC: Recent Labs  Lab 05/11/21 0905 05/12/21 2153 05/13/21 0110  WBC 11.5* 11.1* 12.1*  NEUTROABS 5.2  --  6.6  HGB 11.9* 11.5* 10.8*  HCT 35.8* 34.2* 33.4*  MCV 94.5 95.5 96.0  PLT 316 301 308   Cardiac Enzymes: No results for input(s): CKTOTAL, CKMB, CKMBINDEX, TROPONINI in the last 168 hours. BNP: Invalid input(s): POCBNP CBG: Recent Labs  Lab 05/12/21 2118  GLUCAP 107*   HbA1C: No results for  input(s): HGBA1C in the last 72 hours. Urine analysis:    Component Value Date/Time   COLORURINE YELLOW 05/12/2021 2327   APPEARANCEUR CLEAR 05/12/2021 2327   LABSPEC 1.020 05/12/2021 2327   PHURINE 6.5 05/12/2021 2327   GLUCOSEU NEGATIVE 05/12/2021 2327   HGBUR SMALL (A) 05/12/2021 2327   BILIRUBINUR NEGATIVE 05/12/2021 [place] 05/12/2021 2327   PROTEINUR 30 (A) 05/12/2021 2327   UROBILINOGEN 0.2 01/30/2010 1534   NITRITE NEGATIVE 05/12/2021 2327   LEUKOCYTESUR NEGATIVE 05/12/2021 2327   Sepsis Labs: @LABRCNTIP (procalcitonin:4,lacticidven:4) ) Recent Results (from the past 240 hour(s))  Blood Culture (routine x 2)     Status: None (Preliminary result)   Collection Time: 05/12/21  9:53 PM   Specimen: Blood  Result Value Ref Range Status   Specimen Description BLOOD LEFT HAND  Final   Special Requests   Final    BOTTLES DRAWN AEROBIC AND ANAEROBIC Blood Culture adequate volume Performed at [facility], [address] [place], [place] [address]    Culture PENDING  Incomplete   Report Status PENDING  Incomplete  Resp Panel by RT-PCR (Flu A&B, Covid) Nasopharyngeal Swab     Status: None   Collection Time: 05/13/21  2:10 AM   Specimen: Nasopharyngeal Swab; Nasopharyngeal(NP) swabs in vial transport medium  Result Value Ref Range Status   SARS Coronavirus 2 by RT PCR NEGATIVE NEGATIVE Final    Comment: (NOTE) SARS-CoV-2 target nucleic acids are NOT DETECTED.  The SARS-CoV-2 RNA is generally detectable in upper respiratory specimens during the acute phase of infection. The lowest concentration of SARS-CoV-2 viral copies this assay can detect is 138 copies/mL. A negative result does not preclude SARS-Cov-2 infection and should not be used as the sole basis for treatment or other patient management decisions. A negative result may occur with  improper specimen collection/handling, submission of specimen other than nasopharyngeal swab, presence of viral  mutation(s) within the areas targeted by this assay, and inadequate number of viral copies(<138 copies/mL). A negative result must be combined with clinical observations, patient history, and epidemiological information. The expected result is Negative.  Fact Sheet for Patients:  EntrepreneurPulse.com.au  Fact Sheet for Healthcare Providers:  IncredibleEmployment.be  This test is no t yet approved or cleared by the [place] FDA and  has been authorized for detection and/or diagnosis of SARS-CoV-2 by FDA under an Emergency Use Authorization (EUA). This EUA will remain  in effect (meaning this test can be used) for the duration of the COVID-19 declaration under Section 564(b)(1) of the Act, 21 U.S.C.section 360bbb-3(b)(1), unless the authorization is terminated  or revoked sooner.       Influenza A by PCR NEGATIVE NEGATIVE Final   Influenza B by PCR NEGATIVE NEGATIVE Final    Comment: (NOTE) The Xpert Xpress SARS-CoV-2/FLU/RSV plus assay is intended as an aid in the diagnosis of influenza from Nasopharyngeal  swab specimens and should not be used as a sole basis for treatment. Nasal washings and aspirates are unacceptable for Xpert Xpress SARS-CoV-2/FLU/RSV testing.  Fact Sheet for Patients: EntrepreneurPulse.com.au  Fact Sheet for Healthcare Providers: IncredibleEmployment.be  This test is not yet approved or cleared by the [place] FDA and has been authorized for detection and/or diagnosis of SARS-CoV-2 by FDA under an Emergency Use Authorization (EUA). This EUA will remain in effect (meaning this test can be used) for the duration of the COVID-19 declaration under Section 564(b)(1) of the Act, 21 U.S.C. section 360bbb-3(b)(1), unless the authorization is terminated or revoked.  Performed at [facility], [address], [place], [place] [address]      Scheduled Meds:  colchicine  0.6 mg  Oral BID   enoxaparin (LOVENOX) injection  40 mg Subcutaneous Q24H   predniSONE  10 mg Oral Q breakfast   simvastatin  10 mg Oral QHS   vancomycin variable dose per unstable renal function (pharmacist dosing)   Does not apply See admin instructions   Continuous Infusions:  meropenem (MERREM) IV 2 g (05/13/21 0555)    Procedures/Studies: DG Chest Port 1 View  Result Date: 05/12/2021 CLINICAL DATA:  Questionable sepsis. EXAM: PORTABLE CHEST 1 VIEW COMPARISON:  Chest x-ray 04/13/2021. FINDINGS: Emphysematous changes are again noted. Left upper extremity PICC terminates in the mid SVC. Chronic appearing interstitial changes are stable in the lung bases. There is no lung infiltrate, pleural effusion or pneumothorax. There is stable blunting/scarring of the left costophrenic angle. Cervical spinal fusion plate is present. IMPRESSION: 1. Stable emphysematous and chronic changes. No evidence for acute infiltrate. Electronically Signed   By: Ronney Asters M.D.   On: 05/12/2021 21:46   DG Chest Port 1 View  Result Date: 04/13/2021 CLINICAL DATA:  Shortness of breath EXAM: PORTABLE CHEST 1 VIEW COMPARISON:  Chest x-ray 06/23/2020, CT chest 06/23/2020 FINDINGS: Left PICC with tip overlying the expected region of the superior cavoatrial junction. The heart and mediastinal contours are unchanged. Aortic calcification. Cystic changes of the lungs. No focal consolidation. Chronic coarsened interstitial markings. No overt pulmonary edema. Trace persistent left pleural effusion. No right pleural effusion. No pneumothorax. No acute osseous abnormality. IMPRESSION: 1. Trace persistent left pleural effusion. 2. Emphysema (ICD10-J43.9). Electronically Signed   By: Iven Finn M.D.   On: 04/13/2021 21:52    Orson Eva, DO  [facility]  If 7PM-7AM, please contact night-coverage [email or web address] Password [email or web address] 05/13/2021, 6:48 AM   LOS: 0 days

## 2021-05-13 NOTE — H&P (Addendum)
History and Physical    Jordan May MRN:[id number] DOB: 08/03/1955 DOA: 05/12/2021  PCP: Fusco, Lawrence, MD  Patient coming from: Home   Chief Complaint:  Chief Complaint  Patient presents with   Altered Mental Status     HPI:    65-year-old male with a past medical history of chronic back pain, COPD, diastolic congestive heart failure (Echo 02/2018 EF 65-70%),  hyperlipidemia, pulmonary fibrosis who presents to [facility] emergency department with his wife due to concerns over abnormal behaviors including increasing aggressiveness and agitation.  Of note, patient was admitted to [facility] on 03/10/2021 with an L3 burst fracture with adjacent epidural hematoma as well as T12 fracture.  Patient underwent a L2-L3 laminectomy and foraminotomy on 9/28 [place] of a dural CSF leak and L2-L4 fusion.  Patient was then discharged to inpatient rehab on 10/10 but had to be readmitted to the hospital on 10/11 with encephalopathy.  Patient was then diagnosed with meningitis, ventriculitis and osteomyelitis of the L3 vertebral body.  Patient was then placed on broad-spectrum intravenous antibiotics due to concerns for bacterial meningitis although cultures revealed no growth.  Patient was eventually discharged home 10/26 on intravenous vancomycin and cefepime and oral metronidazole.  Patient has since been following with [facility] infectious disease clinic since discharge.  Patient has been slowly clinically improving and was last seen in infectious disease clinic on 11/4.  Wife explains that for approximately past 3 weeks she has noticed that her husband has become progressively more forgetful and "not his usual self."  In the past 3 to 4 days however, the patient has exhibited increasingly worsening agitation aggressiveness and confusion according to the wife.  Patient is not exhibiting any fevers.  Patient denies any change in his chronic back pain.  Patient has been complaining of  "severe headaches" for the past 3 days in particular but denies photophobia or neck pain or stiffness per the wife.  Patient has not developed any focal weakness, severe headache or neck stiffness.  Patient is complaining of substantial low back pain at this is not a change compared to prior.  There is bizarre behavior as well prompted bring the patient to [facility] hospital emergency department for evaluation.    Upon evaluation in the emergency department patient was continued to exhibit waxing and waning confusion.  Patient was administered a single dose of 1 mg of intravenous Ativan.  Chest x-ray revealed no evidence of pneumonia.  VBG was unremarkable.  CT imaging of the head was not performed due to unavailability of CT imaging right now.  Hospitalist group was then called to assess patient for admission the hospital for continued confusion.  Review of Systems:   Review of Systems  Unable to perform ROS: Mental status change   Past Medical History:  Diagnosis Date   Arthritis    Chronic back pain    Emphysema/COPD (HCC)    Hypercholesterolemia    IPF (idiopathic pulmonary fibrosis) (HCC)    Tobacco use     Past Surgical History:  Procedure Laterality Date   APPENDECTOMY     BACK SURGERY     NECK SURGERY       reports that he has been smoking cigarettes. He has a 50.00 pack-year smoking history. He has never used smokeless tobacco. He reports that he does not drink alcohol and does not use drugs.  Allergies  Allergen Reactions   Gabapentin Other (See Comments)    hallucinates hallucinates   Penicillins Other (See   Comments)    Unknown reaction Childhood allergy    Oxycodone Other (See Comments)    hallucinations Hallucinations 10/9 @1936 patient said he hs no allergies to Oxycodone   Penicillamine Other (See Comments)    Family History  Problem Relation Age of Onset   Heart disease Mother        Pacemaker placement   Throat cancer Father    COPD Brother    Kidney  disease Brother      Prior to Admission medications   Medication Sig Start Date End Date Taking? Authorizing Provider  ceFEPIme (MAXIPIME) 1 GM/50ML SOLN Inject 6 g into the vein daily. Infuse 6 g into the vein daily. Infuse Cefpime 6 gms/240 mls as a continuous infusion over 24 hours via elastomeric device at 10 ml/hr. End date 05/20/21 change device once every 24 hours .    [provider]  colchicine 0.6 MG tablet Take by mouth. Patient not taking: No sig reported    [provider]  COLCRYS 0.6 MG tablet Take 0.6 mg by mouth 2 (two) times daily. 02/27/19   [provider]  gabapentin (NEURONTIN) 100 MG capsule Take 1 capsule by mouth daily.  Patient not taking: Reported on 04/13/2021 12/27/17   [provider]  HYDROcodone-acetaminophen (NORCO) 10-325 MG per tablet Take 1 tablet by mouth every 6 (six) hours as needed. pain 01/31/14   [provider]  nabumetone (RELAFEN) 750 MG tablet Take 750 mg by mouth daily.  Patient not taking: No sig reported 12/28/18   [provider]  ondansetron (ZOFRAN) 4 MG tablet Take 1 tablet (4 mg total) by mouth every 8 (eight) hours as needed for nausea or vomiting. Patient not taking: Reported on 06/20/2019 04/30/16   West, Emily, PA-C  predniSONE (DELTASONE) 10 MG tablet Take 10 mg by mouth daily with breakfast.    [provider]  predniSONE (DELTASONE) 20 MG tablet Take 3 tablets (60 mg total) by mouth daily with breakfast. And decrease by one tablet daily Patient not taking: No sig reported 02/25/18   Tat, Rodolfo, MD  sildenafil (REVATIO) 20 MG tablet Take 20 mg by mouth daily. 06/06/19   [provider]  simvastatin (ZOCOR) 10 MG tablet Take 10 mg by mouth at bedtime. 01/31/14   [provider]  simvastatin (ZOCOR) 10 MG tablet Take by mouth. Patient not taking: No sig reported    [provider]  vancomycin 1,500 mg in sodium chloride 0.9 % 250 mL Inject 1,500 mg into  the vein every 12 (twelve) hours. Infuse 1000 mg into the vein every 100 ml NS over 1 hours, once every 12 hours at 100ml/hr, via elastomeric device. End date 05/20/21 Weekly labs.: CMP,CBCcD,ESR. Non-cardiac    [provider]    Physical Exam: Vitals:   05/12/21 2315 05/13/21 0130 05/13/21 0300 05/13/21 0337  BP: (!) 149/89  118/66 (!) 162/84  Pulse: 84 74 71 84  Resp: 20 16 14 20  Temp:    97.7 F (36.5 C)  TempSrc:    Oral  SpO2: 95% 97% 96% 98%  Weight:      Height:        Constitutional: Awake alert and oriented x 2 no associated distress.   Skin: Dressing along midline lower back is clean dry and intact.  No rashes, no lesions, slightly poor skin turgor noted. Eyes: Pupils are equally reactive to light.  No evidence of scleral icterus or conjunctival pallor.  ENMT: Moist mucous membranes noted.    Posterior pharynx clear of any exudate or lesions.   Neck: normal, supple, no masses, no thyromegaly.  No evidence of jugular venous distension.   Respiratory: clear to auscultation bilaterally, no wheezing, no crackles. Normal respiratory effort. No accessory muscle use.  Cardiovascular: Regular rate and rhythm, no murmurs / rubs / gallops. No extremity edema. 2+ pedal pulses. No carotid bruits.  Chest:   Nontender without crepitus or deformity.   Back:   Nontender without crepitus or deformity. Abdomen: Abdomen is soft and nontender.  No evidence of intra-abdominal masses.  Positive bowel sounds noted in all quadrants.   Musculoskeletal: No joint deformity upper and lower extremities. Good ROM, no contractures. Normal muscle tone.  Neurologic: Sensation intact.  Patient moving all 4 extremities spontaneously.  Patient is following all commands.  Patient is responsive to verbal stimuli.   Psychiatric: Patient exhibits angry mood with labile affect.  Patient currently does not seem to possess insight as to his current situation.   Labs on Admission: I have personally reviewed  following labs and imaging studies -   CBC: Recent Labs  Lab 05/11/21 0905 05/12/21 2153 05/13/21 0110  WBC 11.5* 11.1* 12.1*  NEUTROABS 5.2  --  6.6  HGB 11.9* 11.5* 10.8*  HCT 35.8* 34.2* 33.4*  MCV 94.5 95.5 96.0  PLT 316 301 389   Basic Metabolic Panel: Recent Labs  Lab 05/11/21 0905 05/12/21 2153 05/13/21 0110  NA 136 135 136  K 4.5 4.5 4.2  CL 100 98 100  CO2 _0 GLUCOSE 108* 104* 88  BUN 26* 24* 23  CREATININE 1.54* 1.63* 1.53*  CALCIUM 9.3 9.0 9.1  MG  --   --  2.1   GFR: Estimated Creatinine Clearance: 45.3 mL/min (A) (by C-G formula based on SCr of 1.53 mg/dL (H)). Liver Function Tests: Recent Labs  Lab 05/11/21 0905 05/12/21 2153 05/13/21 0110  AST 14* 12* 11*  ALT _1 ALKPHOS 71 70 67  BILITOT 0.6 0.6 0.6  PROT 6.1* 6.7 6.3*  ALBUMIN 2.7* 3.0* 2.9*   No results for input(s): LIPASE, AMYLASE in the last 168 hours. Recent Labs  Lab 05/13/21 0116  AMMONIA 24   Coagulation Profile: Recent Labs  Lab 05/12/21 2153  INR 1.1   Cardiac Enzymes: No results for input(s): CKTOTAL, CKMB, CKMBINDEX, TROPONINI in the last 168 hours. BNP (last 3 results) No results for input(s): PROBNP in the last 8760 hours. HbA1C: No results for input(s): HGBA1C in the last 72 hours. CBG: Recent Labs  Lab 05/12/21 2118  GLUCAP 107*   Lipid Profile: No results for input(s): CHOL, HDL, LDLCALC, TRIG, CHOLHDL, LDLDIRECT in the last 72 hours. Thyroid Function Tests: Recent Labs    05/12/21 2153  TSH 0.612   Anemia Panel: Recent Labs    05/12/21 2153  VITAMINB12 217  FOLATE 4.7*   Urine analysis:    Component Value Date/Time   COLORURINE YELLOW 05/12/2021 2327   APPEARANCEUR CLEAR 05/12/2021 2327   LABSPEC 1.020 05/12/2021 2327   PHURINE 6.5 05/12/2021 2327   GLUCOSEU NEGATIVE 05/12/2021 2327   HGBUR SMALL (A) 05/12/2021 2327   BILIRUBINUR NEGATIVE 05/12/2021 2327   KETONESUR NEGATIVE 05/12/2021 2327   PROTEINUR 30 (A) 05/12/2021  2327   UROBILINOGEN 0.2 01/30/2010 1534   NITRITE NEGATIVE 05/12/2021 2327   LEUKOCYTESUR NEGATIVE 05/12/2021 2327    Radiological Exams on Admission - Personally Reviewed: DG Chest Port 1 View  Result Date: 05/12/2021 CLINICAL DATA:  Questionable sepsis. EXAM:  PORTABLE CHEST 1 VIEW COMPARISON:  Chest x-ray 04/13/2021. FINDINGS: Emphysematous changes are again noted. Left upper extremity PICC terminates in the mid SVC. Chronic appearing interstitial changes are stable in the lung bases. There is no lung infiltrate, pleural effusion or pneumothorax. There is stable blunting/scarring of the left costophrenic angle. Cervical spinal fusion plate is present. IMPRESSION: 1. Stable emphysematous and chronic changes. No evidence for acute infiltrate. Electronically Signed   By: Ronney Asters M.D.   On: 05/12/2021 21:46    Telemetry: Personally reviewed.  Rhythm is NSR with heart rate of 81BPM.    Assessment/Plan  * Acute metabolic encephalopathy Patient presenting with 3-week history of gradually progressive confusion and disorientation with 3 to 4-day history of particularly severe aggression and change in behavior. Etiology is not entirely clear at this time, performing extensive work-up in this patient with recent CNS infection We must determine as to whether patient's osteomyelitis/meningitis/ventriculitis are worsening.  Obtaining inflammatory markers, blood cultures.   MRI brain with contrast later today considering patient's 3-day history of severe headaches according to the wife.  On physical examination, neck is supple and back is nontender with dressing clean and dry and low back surgical wound not revealing any evidence of active infection Obtaining standard encephalopathy work-up including electrolytes, TSH, B12, folate, LFTs all of this portion of the work-up is likely to be unremarkable Finally, if all the work-up above ends up being negative it is quite possible that the patient's  longstanding use of cefepime could be the culprit as this medication can cause confusion with some significant frequency.  To address this possibility I have discontinued cefepime and instead I am treating patient with scheduled meropenem instead Patient would likely benefit from infectious disease consultation on day shift prior to discharge  Ventriculitis of brain due to infection Please see assessment and plan above  Acute bacterial meningitis Patient has been switched to intravenous meropenem alongside intravenous vancomycin Remainder of assessment and plan as above   Osteomyelitis of lumbar spine ([place]) Please see assessment and plan above  COPD (chronic obstructive pulmonary disease) ([place]) No evidence of COPD exacerbation this time As needed bronchodilator therapy for episodic shortness of breath and wheezing.   Chronic diastolic CHF (congestive heart failure) (HCC) No clinical evidence of cardiogenic volume overload     Hyperlipidemia, mixed Continuing home regimen of lipid lowering therapy.        Code Status:  Full code  code status decision has been confirmed with: spouse Family Communication: Plan of care has been discussed with the wife via phone conversation who is the decision maker for this patient.    Status is: Observation  The patient will require care spanning > 2 midnights and should be moved to inpatient because: Metabolic encephalopathy in a patient complicated by active CNS infection requiring extensive work-up, serial neurologic checks and potential need for expert consultation including infectious disease.     Vernelle Emerald MD [facility] Pager [phone]  If 7PM-7AM, please contact night-coverage [email or web address] Use universal [place] password for that web site. If you do not have the password, please call the hospital operator.  05/13/2021, 6:02 AM

## 2021-05-14 DIAGNOSIS — G9341 Metabolic encephalopathy: Secondary | ICD-10-CM | POA: Diagnosis not present

## 2021-05-14 DIAGNOSIS — A86 Unspecified viral encephalitis: Secondary | ICD-10-CM | POA: Diagnosis not present

## 2021-05-14 DIAGNOSIS — M4626 Osteomyelitis of vertebra, lumbar region: Secondary | ICD-10-CM | POA: Diagnosis not present

## 2021-05-14 DIAGNOSIS — I5032 Chronic diastolic (congestive) heart failure: Secondary | ICD-10-CM | POA: Diagnosis not present

## 2021-05-14 MED ORDER — VANCOMYCIN IV (FOR PTA / DISCHARGE USE ONLY): 1250.0000 mg | INTRAVENOUS | 0 refills | Status: AC

## 2021-05-14 MED ORDER — CYANOCOBALAMIN 500 MCG PO TABS: 500.0000 ug | ORAL_TABLET | Freq: Every day | ORAL | Status: AC

## 2021-05-14 MED ORDER — MEROPENEM IV (FOR PTA / DISCHARGE USE ONLY): 2.0000 g | Freq: Two times a day (BID) | INTRAVENOUS | 0 refills | Status: AC

## 2021-05-14 MED ORDER — FOLIC ACID 1 MG PO TABS: 1.0000 mg | ORAL_TABLET | Freq: Every day | ORAL | Status: AC

## 2021-05-14 MED ORDER — TRAZODONE HCL 50 MG PO TABS: 25.0000 mg | ORAL_TABLET | Freq: Once | ORAL | Status: DC

## 2021-05-14 MED ORDER — SODIUM CHLORIDE 0.9 % IV SOLN: 2.0000 g | Freq: Two times a day (BID) | INTRAVENOUS | Status: AC

## 2021-05-14 NOTE — Progress Notes (Signed)
Pt transported via wheelchair to main entrance for discharge.

## 2021-05-14 NOTE — Progress Notes (Addendum)
PHARMACY CONSULT NOTE FOR:  OUTPATIENT  PARENTERAL ANTIBIOTIC THERAPY (OPAT)  Indication: meningitis/ventriculitis  Regimen: Meropenem 2000 mg IV every 12 hours. Vancomycin 1250 mg IV every 48 hours. Next dose due 12/2 1800 End date: Last day of therapy 05/20/2021  IV antibiotic discharge orders are pended. To discharging provider:  please sign these orders via discharge navigator,  Select New Orders & click on the button choice - Manage This Unsigned Work.     Thank you for allowing pharmacy to be a part of this patient's care.  Ramond Craver 05/14/2021, 11:47 AM

## 2021-05-14 NOTE — Discharge Summary (Signed)
Physician Discharge Summary  Jordan May ZSW:[id number] DOB: 1955/11/13 DOA: 05/12/2021  PCP: Redmond School, MD  Admit date: 05/12/2021 Discharge date: 05/14/2021  Admitted From: Home Disposition:  Home   Recommendations for Outpatient Follow-up:  Follow up with PCP in 1-2 weeks Please obtain BMP/CBC in one week   Home Health: YES Equipment/Devices:HHRN  Discharge Condition: Stable CODE STATUS:FULL Diet recommendation: Heart Healthy   Brief/Interim Summary: 65 year old male with a history of COPD, pulmonary fibrosis, chronic respiratory failure on 2 L, hyperlipidemia, and chronic back pain presented with increasing confusion and aggressive behavior. The patient has a rather complicated history with recent admissions to the hospital at [facility].  Notably, the patient was admitted to [facility] from 03/10/2021 to 03/23/2021 when he fell off a tractor resulting in L3 burst fracture with adjacent epidural hematoma as well as a T12 fracture.  He was taken to the OR on 03/03/2021 for L2-3 laminectomy and foraminotomy, repair of dural CSF leak and a posterior lateral L2-L4 fusion.  Postoperatively, he developed an ileus which required NG decompression as well as uncontrolled pain.  He gradually improved and was subsequently discharged to inpatient rehab on 03/23/2021.  On 03/24/2021, rapid response was called and the patient was found to be lethargic and tachycardic and hypoxic.  He was initially started on clindamycin by rehabilitation medicine.  The patient was transferred back to acute hospital setting and treated for sepsis which likely included infection of his hardware, UTI, and pneumonia.  MRI of the brain and MRI of the lumbar spine showed concerning findings for purulen material layering in the occipital horns and lateral ventricles with mild communicating hydrocephalus and leptomeningeal enhancement concerning for bacterial meningitis.  There was also infectious osteomyelitis of T12 and L3  vertebral bodies without evidence of epidural abscess.  There is also concern for surgical site and infection.  Marland Kitchen MRI of T and C-spine revealed nonspecific nodular enhancement in the posterior aspect of C7-T1 interspinous interval less likely concerning for osteomyelitis.  IR subsequently performed an LP.  Meningitis panel was negative.  Cultures are negative.  CSF showed 173 WBC, glucose 15, protein 132.  The patient was discharged with vancomycin, cefepime, and metronidazole with the end date of 05/20/2021.   It was noted during his hospitalization that he had encephalopathy and agitation that was managed with Ativan as well as Seroquel twice daily 25 mg.  It was noted that the patient did demonstrate some cognitive impairment as manifested by intermittent impulsive behavior as well as impulsiveness.  He was subsequently discharged home on 04/08/2021 with home health.   Since discharge from the hospital, the wife stated that the patient has become progressively more forgetful and "not his usual self".  In the last 3 to 4 days, the patient has exhibited increasing agitation and aggressive behavior.  He has been complained of headaches for the past 3 days but denies photophobia, neck pain, or neck stiffness.  There is no focal extremity weakness   Upon evaluation in the emergency department patient was continued to exhibit waxing and waning confusion.  Patient was administered a single dose of 1 mg of intravenous Ativan.  Chest x-ray revealed no evidence of pneumonia.  VBG was unremarkable.  CT imaging of the head was not performed due to unavailability of CT imaging right now.  Hospitalist group was then called to assess patient for admission the hospital for continued confusion.  His cefepime and metronidazole were stopped and metabolic work up undertaken.  He was started on meropenem  in lieu of his cefepime and metronidazole. Work only reviewed low B12 and low folate which were repleted.  His mental  status improved and was near his baseline on day of discharge.  Discharge Diagnoses:  Acute metabolic encephalopathy -Etiology multifactorial including low B12/folate, neurotoxicity from cefepime and likely progression of his underlying cognitive impairement -Serum B12--217 (low normal) -Folic acid 4.7 (low) -TSH 0.612 -Ammonia--24 -UA negative for pyuria -Doubt worsening infection as the patient's ESR has gradually decreased in the past 2 weeks 80>>70>59 and patient has been afebrile -UDS--positive opiate -11/30 MRI brain--Trace material in the occipital horn of the right lateral ventricle which may reflect residua of the patient's recently treated meningitis and ventriculitis. No other current findings of intracranial infection. No hydrocephalus. -Personally reviewed chest x-ray--negative for pyuria -No signs of surgical site infection or meningismus -11/30>> patient is alert and oriented x3 -It was noted during his hospitalizations at [facility] that he had some cognitive deficits as manifested by intermittent impulsive behavior -Plan to repeat B12 as it is low normal as well as supplement folate -also patient noted to have some cognitive deficits/decline endorsed by spouse which may represent some progression of underlying memory--"got his days/nights" mixed up since come home from [facility] -05/14/21-mental status near baseline    Bacterial meningitis and ventriculitis -Patient has been on vancomycin and cefepime since 03/24/2021 and metronidazole since 03/28/2021 -Cefepime and metronidazole have been discontinued with mild improvement of his mental status -Continued meropenem -Tentative stop date of his antibiotics 05/20/2021   Acute osteomyelitis lumbar spine -Continue vancomycin and meropenem for now -ESR trending down -He had previous MRI finding of osteomyelitis of T12 and L3   CKD stage IIIb -Baseline creatinine 1.5-1.7   Pulmonary fibrosis -Stable on room air    COPD -Stable -Continue as needed bronchodilator therapy   Chronic diastolic CHF -Clinically euvolemic   Hyperlipidemia -Continue statin      Discharge Instructions  Discharge Instructions     [facility] pharmacist to adjust dose for Vancomycin, Aminoglycosides and other anti-infective therapies as requested by physician.   Complete by: As directed    [facility] to provide Cath Flo 70m   Complete by: As directed    Administer for PICC line occlusion and as ordered by physician for other access device issues.   Anaphylaxis Kit: Provided to treat any anaphylactic reaction to the medication being provided to the patient if First Dose or when requested by physician   Complete by: As directed    Epinephrine 155mml vial / amp: Administer 0.32m31m0.32ml65mubcutaneously once for moderate to severe anaphylaxis, nurse to call physician and pharmacy when reaction occurs and call 911 if needed for immediate care   Diphenhydramine 50mg52mIV vial: Administer 25-50mg 28mM PRN for first dose reaction, rash, itching, mild reaction, nurse to call physician and pharmacy when reaction occurs   Sodium Chloride 0.9% NS 500ml I51mdminister if needed for hypovolemic blood pressure drop or as ordered by physician after call to physician with anaphylactic reaction   Change dressing on IV access line weekly and PRN   Complete by: As directed    Flush IV access with Sodium Chloride 0.9% and Heparin 10 units/ml or 100 units/ml   Complete by: As directed    Home infusion instructions - [facility]   Complete by: As directed    Instructions: Flush IV access with Sodium Chloride 0.9% and Heparin 10units/ml or 100units/ml   Change dressing on IV access line: Weekly and PRN  Instructions Cath Flo 49m: Administer for PICC Line occlusion and as ordered by physician for other access device   [facility] pharmacist to adjust dose for: Vancomycin, Aminoglycosides and  other anti-infective therapies as requested by physician   Method of administration may be changed at the discretion of home infusion pharmacist based upon assessment of the patient and/or caregiver's ability to self-administer the medication ordered   Complete by: As directed    Outpatient Parenteral Antibiotic Therapy Information Antibiotic: Vancomycin IVPB, Meropenem (Merrem) IVPB; Indications for use: vertebral osteomyelitis; ventriculitis, meningitis; End Date: 05/20/2021   Complete by: As directed    Antibiotic:  Vancomycin IVPB Meropenem (Merrem) IVPB     Indications for use: vertebral osteomyelitis; ventriculitis, meningitis   End Date: 05/20/2021      Allergies as of 05/14/2021       Reactions   Gabapentin Other (See Comments)   hallucinates hallucinates   Penicillins Other (See Comments)   Unknown reaction Childhood allergy    Oxycodone Other (See Comments)   hallucinations Hallucinations 10/9 @1936  patient said he hs no allergies to Oxycodone   Penicillamine Other (See Comments)        Medication List     STOP taking these medications    ceFEPIme 1 GM/50ML Soln Commonly known as: MAXIPIME   gabapentin 100 MG capsule Commonly known as: NEURONTIN   nabumetone 750 MG tablet Commonly known as: RELAFEN   ondansetron 4 MG tablet Commonly known as: ZOFRAN   predniSONE 10 MG tablet Commonly known as: DELTASONE   predniSONE 20 MG tablet Commonly known as: DELTASONE   simvastatin 10 MG tablet Commonly known as: ZOCOR       TAKE these medications    Colcrys 0.6 MG tablet Generic drug: colchicine Take 0.6 mg by mouth 2 (two) times daily. As needed for  gout   folic acid 1 MG tablet Commonly known as: FOLVITE Take 1 tablet (1 mg total) by mouth daily. Start taking on: May 15, 2021   HYDROcodone-acetaminophen 10-325 MG tablet Commonly known as: NORCO Take 1 tablet by mouth every 6 (six) hours as needed. pain   meropenem  IVPB Commonly known  as: MERREM Inject 2 g into the vein every 12 (twelve) hours for 13 doses. Indication:  meningitis/ventriculitis  First Dose: Yes Last Day of Therapy:  05/20/2021 Labs - Once weekly:  CBC/D and BMP, Labs - Every other week:  ESR and CRP Method of administration: Mini-Bag Plus / Gravity Method of administration may be changed at the discretion of home infusion pharmacist based upon assessment of the patient and/or caregiver's ability to self-administer the medication ordered.   meropenem 2 g in sodium chloride 0.9 % 100 mL Inject 2 g into the vein every 12 (twelve) hours. Last dose on 05/20/21   sildenafil 20 MG tablet Commonly known as: REVATIO Take 20 mg by mouth 2 (two) times daily.   vancomycin  IVPB Inject 1,250 mg into the vein every other day for 6 days. Indication:  meningitis/ventriculitis  First Dose: Yes Last Day of Therapy:  05/20/2021 Labs - Sunday/Monday:  CBC/D, BMP, and vancomycin trough. Labs - Thursday:  BMP and vancomycin trough Labs - Every other week:  ESR and CRP Method of administration:Elastomeric Method of administration may be changed at the discretion of the patient and/or caregiver's ability to self-administer the medication ordered. Start taking on: May 15, 2021   vancomycin 1,500 mg in sodium chloride 0.9 % 250 mL Inject 1,000 mg into the vein every  other day.   vitamin B-12 500 MCG tablet Commonly known as: CYANOCOBALAMIN Take 1 tablet (500 mcg total) by mouth daily. Start taking on: May 15, 2021               Discharge Care Instructions  (From admission, onward)           Start     Ordered   05/14/21 0000  Change dressing on IV access line weekly and PRN  (Home infusion instructions - [facility] )        05/14/21 1337            Allergies  Allergen Reactions   Gabapentin Other (See Comments)    hallucinates hallucinates   Penicillins Other (See Comments)    Unknown reaction Childhood allergy     Oxycodone Other (See Comments)    hallucinations Hallucinations 10/9 @1936  patient said he hs no allergies to Oxycodone   Penicillamine Other (See Comments)    Consultations: none   Procedures/Studies: MR BRAIN W WO CONTRAST  Result Date: 05/13/2021 CLINICAL DATA:  Bacterial meningitis.  Ventriculitis.  Headaches. EXAM: MRI HEAD WITHOUT AND WITH CONTRAST TECHNIQUE: Multiplanar, multiecho pulse sequences of the brain and surrounding structures were obtained without and with intravenous contrast. CONTRAST:  7.62m GADAVIST GADOBUTROL 1 MMOL/ML IV SOLN COMPARISON:  [place] [facility] [place] [facility]: There is no evidence of an acute infarct, mass, midline shift, or extra-axial fluid collection. A chronic microhemorrhage is noted in the posterior left temporal lobe. Patchy T2 hyperintensities in the cerebral white matter and pons are nonspecific but compatible with moderate chronic small vessel ischemic disease. There is a 4 mm focus of FLAIR hyperintensity and restricted diffusion in the occipital horn of the right lateral ventricle (series 5, image 15 and series 12, image 16). The ventricles are normal in size. No abnormal brain parenchymal or meningeal enhancement is identified. Vascular: Major intracranial vascular flow voids are preserved. Skull and upper cervical spine: Unremarkable bone marrow signal. Sinuses/Orbits: Unremarkable orbits. Paranasal sinuses and mastoid air cells are clear. Other: None. IMPRESSION: 1. Trace material in the occipital horn of the right lateral ventricle which may reflect residua of the patient's recently treated meningitis and ventriculitis. No other current findings of intracranial infection. No hydrocephalus. 2. Moderate chronic small vessel ischemic disease. Electronically Signed   By: ALogan BoresM.D.   On: 05/13/2021 11:55   DG Chest Port 1 View  Result Date: 05/12/2021 CLINICAL DATA:  Questionable sepsis. EXAM: PORTABLE CHEST 1  VIEW COMPARISON:  Chest x-ray 04/13/2021. FINDINGS: Emphysematous changes are again noted. Left upper extremity PICC terminates in the mid SVC. Chronic appearing interstitial changes are stable in the lung bases. There is no lung infiltrate, pleural effusion or pneumothorax. There is stable blunting/scarring of the left costophrenic angle. Cervical spinal fusion plate is present. IMPRESSION: 1. Stable emphysematous and chronic changes. No evidence for acute infiltrate. Electronically Signed   By: ARonney AstersM.D.   On: 05/12/2021 21:46        Discharge Exam: Vitals:   05/14/21 0609 05/14/21 1238  BP: 130/67 (!) 142/77  Pulse: 85 76  Resp: 20 18  Temp: 98.3 F (36.8 C) (!) 97.4 F (36.3 C)  SpO2: 90% 94%   Vitals:   05/13/21 0712 05/13/21 1148 05/14/21 0609 05/14/21 1238  BP: (!) 124/91 132/76 130/67 (!) 142/77  Pulse: 93 75 85 76  Resp: 20 18 20 18   Temp: 98.2 F (36.8 C) 98.2  F (36.8 C) 98.3 F (36.8 C) (!) 97.4 F (36.3 C)  TempSrc:  Oral  Oral  SpO2: 93% 94% 90% 94%  Weight:      Height:        General: Pt is alert, awake, not in acute distress Cardiovascular: RRR, S1/S2 +, no rubs, no gallops Respiratory:diminished BS but CTA Abdominal: Soft, NT, ND, bowel sounds + Extremities: no edema, no cyanosis Neuro:  CN II-XII intact, strength 4/5 in RUE, RLE, strength 4/5 LUE, LLE; sensation intact bilateral; no dysmetria; babinski equivocal    The results of significant diagnostics from this hospitalization (including imaging, microbiology, ancillary and laboratory) are listed below for reference.    Significant Diagnostic Studies: MR BRAIN W WO CONTRAST  Result Date: 05/13/2021 CLINICAL DATA:  Bacterial meningitis.  Ventriculitis.  Headaches. EXAM: MRI HEAD WITHOUT AND WITH CONTRAST TECHNIQUE: Multiplanar, multiecho pulse sequences of the brain and surrounding structures were obtained without and with intravenous contrast. CONTRAST:  7.23m GADAVIST GADOBUTROL 1 MMOL/ML  IV SOLN COMPARISON:  Report [facility]/[place] [facility]: There is no evidence of an acute infarct, mass, midline shift, or extra-axial fluid collection. A chronic microhemorrhage is noted in the posterior left temporal lobe. Patchy T2 hyperintensities in the cerebral white matter and pons are nonspecific but compatible with moderate chronic small vessel ischemic disease. There is a 4 mm focus of FLAIR hyperintensity and restricted diffusion in the occipital horn of the right lateral ventricle (series 5, image 15 and series 12, image 16). The ventricles are normal in size. No abnormal brain parenchymal or meningeal enhancement is identified. Vascular: Major intracranial vascular flow voids are preserved. Skull and upper cervical spine: Unremarkable bone marrow signal. Sinuses/Orbits: Unremarkable orbits. Paranasal sinuses and mastoid air cells are clear. Other: None. IMPRESSION: 1. Trace material in the occipital horn of the right lateral ventricle which may reflect residua of the patient's recently treated meningitis and ventriculitis. No other current findings of intracranial infection. No hydrocephalus. 2. Moderate chronic small vessel ischemic disease. Electronically Signed   By: ALogan BoresM.D.   On: 05/13/2021 11:55   DG Chest Port 1 View  Result Date: 05/12/2021 CLINICAL DATA:  Questionable sepsis. EXAM: PORTABLE CHEST 1 VIEW COMPARISON:  Chest x-ray 04/13/2021. FINDINGS: Emphysematous changes are again noted. Left upper extremity PICC terminates in the mid SVC. Chronic appearing interstitial changes are stable in the lung bases. There is no lung infiltrate, pleural effusion or pneumothorax. There is stable blunting/scarring of the left costophrenic angle. Cervical spinal fusion plate is present. IMPRESSION: 1. Stable emphysematous and chronic changes. No evidence for acute infiltrate. Electronically Signed   By: ARonney AstersM.D.   On: 05/12/2021 21:46     Microbiology: Recent Results (from the past 240 hour(s))  Blood Culture (routine x 2)     Status: None (Preliminary result)   Collection Time: 05/12/21  9:53 PM   Specimen: BLOOD LEFT HAND  Result Value Ref Range Status   Specimen Description BLOOD LEFT HAND  Final   Special Requests   Final    BOTTLES DRAWN AEROBIC AND ANAEROBIC Blood Culture adequate volume   Culture   Final    NO GROWTH 2 DAYS [facility] [address], [place]  [address]   Report Status PENDING  Incomplete  Resp Panel by RT-PCR (Flu A&B, Covid) Nasopharyngeal Swab     Status: None   Collection Time: 05/13/21  2:10 AM   Specimen: Nasopharyngeal Swab; Nasopharyngeal(NP)  swabs in vial transport medium  Result Value Ref Range Status   SARS Coronavirus 2 by RT PCR NEGATIVE NEGATIVE Final    Comment: (NOTE) SARS-CoV-2 target nucleic acids are NOT DETECTED.  The SARS-CoV-2 RNA is generally detectable in upper respiratory specimens during the acute phase of infection. The lowest concentration of SARS-CoV-2 viral copies this assay can detect is 138 copies/mL. A negative result does not preclude SARS-Cov-2 infection and should not be used as the sole basis for treatment or other patient management decisions. A negative result may occur with  improper specimen collection/handling, submission of specimen other than nasopharyngeal swab, presence of viral mutation(s) within the areas targeted by this assay, and inadequate number of viral copies(<138 copies/mL). A negative result must be combined with clinical observations, patient history, and epidemiological information. The expected result is Negative.  Fact Sheet for Patients:  EntrepreneurPulse.com.au  Fact Sheet for Healthcare Providers:  IncredibleEmployment.be  This test is no t yet approved or cleared by the [place] FDA and  has been authorized for detection and/or diagnosis of SARS-CoV-2  by FDA under an Emergency Use Authorization (EUA). This EUA will remain  in effect (meaning this test can be used) for the duration of the COVID-19 declaration under Section 564(b)(1) of the Act, 21 U.S.C.section 360bbb-3(b)(1), unless the authorization is terminated  or revoked sooner.       Influenza A by PCR NEGATIVE NEGATIVE Final   Influenza B by PCR NEGATIVE NEGATIVE Final    Comment: (NOTE) The Xpert Xpress SARS-CoV-2/FLU/RSV plus assay is intended as an aid in the diagnosis of influenza from Nasopharyngeal swab specimens and should not be used as a sole basis for treatment. Nasal washings and aspirates are unacceptable for Xpert Xpress SARS-CoV-2/FLU/RSV testing.  Fact Sheet for Patients: EntrepreneurPulse.com.au  Fact Sheet for Healthcare Providers: IncredibleEmployment.be  This test is not yet approved or cleared by the [place] FDA and has been authorized for detection and/or diagnosis of SARS-CoV-2 by FDA under an Emergency Use Authorization (EUA). This EUA will remain in effect (meaning this test can be used) for the duration of the COVID-19 declaration under Section 564(b)(1) of the Act, 21 U.S.C. section 360bbb-3(b)(1), unless the authorization is terminated or revoked.  Performed at [facility], [address], [place], [place] [address]      Labs: Basic Metabolic Panel: Recent Labs  Lab 05/11/21 0905 05/12/21 2153 05/13/21 0110  NA 136 135 136  K 4.5 4.5 4.2  CL 100 98 100  CO2 26 26 27   GLUCOSE 108* 104* 88  BUN 26* 24* 23  CREATININE 1.54* 1.63* 1.53*  CALCIUM 9.3 9.0 9.1  MG  --   --  2.1   Liver Function Tests: Recent Labs  Lab 05/11/21 0905 05/12/21 2153 05/13/21 0110  AST 14* 12* 11*  ALT 15 16 13   ALKPHOS 71 70 67  BILITOT 0.6 0.6 0.6  PROT 6.1* 6.7 6.3*  ALBUMIN 2.7* 3.0* 2.9*   No results for input(s): LIPASE, AMYLASE in the last 168 hours. Recent Labs  Lab 05/13/21 0116  AMMONIA 24    CBC: Recent Labs  Lab 05/11/21 0905 05/12/21 2153 05/13/21 0110  WBC 11.5* 11.1* 12.1*  NEUTROABS 5.2  --  6.6  HGB 11.9* 11.5* 10.8*  HCT 35.8* 34.2* 33.4*  MCV 94.5 95.5 96.0  PLT 316 301 308   Cardiac Enzymes: No results for input(s): CKTOTAL, CKMB, CKMBINDEX, TROPONINI in the last 168 hours. BNP: Invalid input(s): POCBNP CBG: Recent Labs  Lab  05/12/21 2118  GLUCAP 107*    Time coordinating discharge:  36 minutes  Signed:  Orson Eva, DO [facility] Pager: [phone] 05/14/2021, 1:37 PM

## 2021-05-14 NOTE — TOC Transition Note (Signed)
Transition of Care [facility]) - CM/SW Discharge Note   Patient Details  Name: Jordan May MRN: [id number] Date of Birth: [birth year]  [facility] CM/SW Contact:  Natasha Bence, LCSW Phone Number: 05/14/2021, 2:02 PM   Clinical Narrative:    CSW notified of patient's readiness for discharge. CSW notified that patient currently receives IV antibiotics with [facility] and [facility] with [facility]. CSW faxed OPAT orders to [facility] and notified Vaughan Basta with [facility] of patient's discharge [place] signing off.   Final next level of care: [place] Barriers to Discharge: Barriers Resolved   Patient Goals and CMS Choice Patient states their goals for this hospitalization and ongoing recovery are:: Return home with [facility] CMS Medicare.gov Compare Post Acute Care list provided to:: Patient Choice offered to / list presented to : Patient  Discharge Placement                    Patient and family notified of of transfer: 05/14/21  Discharge Plan and [place]: [place] ([facility]), Other - See comment [facility] for IV antibiotics)        Social Determinants of Health (SDOH) Interventions     Readmission Risk Interventions No flowsheet data found.

## 2021-05-15 DIAGNOSIS — S32031D Stable burst fracture of third lumbar vertebra, subsequent encounter for fracture with routine healing: Secondary | ICD-10-CM | POA: Diagnosis not present

## 2021-05-15 DIAGNOSIS — S22089D Unspecified fracture of T11-T12 vertebra, subsequent encounter for fracture with routine healing: Secondary | ICD-10-CM | POA: Diagnosis not present

## 2021-05-15 DIAGNOSIS — T8463XA Infection and inflammatory reaction due to internal fixation device of spine, initial encounter: Secondary | ICD-10-CM | POA: Diagnosis not present

## 2021-05-15 DIAGNOSIS — T8142XA Infection following a procedure, deep incisional surgical site, initial encounter: Secondary | ICD-10-CM | POA: Diagnosis not present

## 2021-05-15 DIAGNOSIS — G039 Meningitis, unspecified: Secondary | ICD-10-CM | POA: Diagnosis not present

## 2021-05-15 DIAGNOSIS — M4626 Osteomyelitis of vertebra, lumbar region: Secondary | ICD-10-CM | POA: Diagnosis not present

## 2021-05-17 ENCOUNTER — Other Ambulatory Visit (HOSPITAL_COMMUNITY)
Admission: RE | Admit: 2021-05-17 | Discharge: 2021-05-17 | Disposition: A | Discharge status: Home / Self Care | Payer: Medicare Other | Source: Other Acute Inpatient Hospital | Attending: Internal Medicine | Admitting: Internal Medicine

## 2021-05-17 DIAGNOSIS — G039 Meningitis, unspecified: Secondary | ICD-10-CM | POA: Diagnosis not present

## 2021-05-17 DIAGNOSIS — M869 Osteomyelitis, unspecified: Secondary | ICD-10-CM | POA: Diagnosis not present

## 2021-05-17 DIAGNOSIS — M4626 Osteomyelitis of vertebra, lumbar region: Secondary | ICD-10-CM | POA: Diagnosis not present

## 2021-05-17 DIAGNOSIS — S22089D Unspecified fracture of T11-T12 vertebra, subsequent encounter for fracture with routine healing: Secondary | ICD-10-CM | POA: Diagnosis not present

## 2021-05-17 DIAGNOSIS — T8463XA Infection and inflammatory reaction due to internal fixation device of spine, initial encounter: Secondary | ICD-10-CM | POA: Diagnosis not present

## 2021-05-17 DIAGNOSIS — S32031D Stable burst fracture of third lumbar vertebra, subsequent encounter for fracture with routine healing: Secondary | ICD-10-CM | POA: Diagnosis not present

## 2021-05-17 DIAGNOSIS — T8142XA Infection following a procedure, deep incisional surgical site, initial encounter: Secondary | ICD-10-CM | POA: Diagnosis not present

## 2021-05-17 LAB — BASIC METABOLIC PANEL
Anion gap: 10 (ref 5–15)
BUN: 24 mg/dL — ABNORMAL HIGH (ref 8–23)
CO2: 28 mmol/L (ref 22–32)
Calcium: 8.5 mg/dL — ABNORMAL LOW (ref 8.9–10.3)
Chloride: 96 mmol/L — ABNORMAL LOW (ref 98–111)
Creatinine, Ser: 1.65 mg/dL — ABNORMAL HIGH (ref 0.61–1.24)
GFR, Estimated: 45 mL/min — ABNORMAL LOW (ref >60)
Glucose, Bld: 109 mg/dL — ABNORMAL HIGH (ref 70–99)
Potassium: 4.6 mmol/L (ref 3.5–5.1)
Sodium: 134 mmol/L — ABNORMAL LOW (ref 135–145)

## 2021-05-17 LAB — CBC WITH DIFFERENTIAL/PLATELET
Abs Immature Granulocytes: 0.07 10*3/uL (ref 0.00–0.07)
Basophils Absolute: 0 10*3/uL (ref 0.0–0.1)
Basophils Relative: 0 %
Eosinophils Absolute: 0.3 10*3/uL (ref 0.0–0.5)
Eosinophils Relative: 2 %
HCT: 31.1 % — ABNORMAL LOW (ref 39.0–52.0)
Hemoglobin: 10.3 g/dL — ABNORMAL LOW (ref 13.0–17.0)
Immature Granulocytes: 1 %
Lymphocytes Relative: 20 %
Lymphs Abs: 3 10*3/uL (ref 0.7–4.0)
MCH: 31.5 pg (ref 26.0–34.0)
MCHC: 33.1 g/dL (ref 30.0–36.0)
MCV: 95.1 fL (ref 80.0–100.0)
Monocytes Absolute: 2.3 10*3/uL — ABNORMAL HIGH (ref 0.1–1.0)
Monocytes Relative: 16 %
Neutro Abs: 9.4 10*3/uL — ABNORMAL HIGH (ref 1.7–7.7)
Neutrophils Relative %: 61 %
Platelets: 196 10*3/uL (ref 150–400)
RBC: 3.27 MIL/uL — ABNORMAL LOW (ref 4.22–5.81)
RDW: 13.2 % (ref 11.5–15.5)
WBC: 15 10*3/uL — ABNORMAL HIGH (ref 4.0–10.5)
nRBC: 0 % (ref 0.0–0.2)

## 2021-05-17 LAB — VANCOMYCIN, TROUGH: Vancomycin Tr: 16 ug/mL (ref 15–20)

## 2021-05-17 LAB — CULTURE, BLOOD (ROUTINE X 2)
Culture: NO GROWTH
Special Requests: ADEQUATE

## 2021-05-19 DIAGNOSIS — G039 Meningitis, unspecified: Secondary | ICD-10-CM | POA: Diagnosis not present

## 2021-05-19 DIAGNOSIS — M4626 Osteomyelitis of vertebra, lumbar region: Secondary | ICD-10-CM | POA: Diagnosis not present

## 2021-05-19 DIAGNOSIS — Z79899 Other long term (current) drug therapy: Secondary | ICD-10-CM | POA: Diagnosis not present

## 2021-05-19 DIAGNOSIS — Z885 Allergy status to narcotic agent status: Secondary | ICD-10-CM | POA: Diagnosis not present

## 2021-05-19 DIAGNOSIS — Z969 Presence of functional implant, unspecified: Secondary | ICD-10-CM | POA: Diagnosis not present

## 2021-05-19 DIAGNOSIS — A86 Unspecified viral encephalitis: Secondary | ICD-10-CM | POA: Diagnosis not present

## 2021-05-19 DIAGNOSIS — Z792 Long term (current) use of antibiotics: Secondary | ICD-10-CM | POA: Diagnosis not present

## 2021-05-19 DIAGNOSIS — Z880 Allergy status to penicillin: Secondary | ICD-10-CM | POA: Diagnosis not present

## 2021-05-19 DIAGNOSIS — Z888 Allergy status to other drugs, medicaments and biological substances status: Secondary | ICD-10-CM | POA: Diagnosis not present

## 2021-06-02 DIAGNOSIS — M4626 Osteomyelitis of vertebra, lumbar region: Secondary | ICD-10-CM | POA: Diagnosis not present

## 2021-06-02 DIAGNOSIS — R4182 Altered mental status, unspecified: Secondary | ICD-10-CM | POA: Diagnosis not present

## 2021-06-02 DIAGNOSIS — Z6825 Body mass index (BMI) 25.0-25.9, adult: Secondary | ICD-10-CM | POA: Diagnosis not present

## 2021-06-02 DIAGNOSIS — E663 Overweight: Secondary | ICD-10-CM | POA: Diagnosis not present

## 2021-06-02 DIAGNOSIS — T8142XA Infection following a procedure, deep incisional surgical site, initial encounter: Secondary | ICD-10-CM | POA: Diagnosis not present

## 2021-06-02 DIAGNOSIS — F419 Anxiety disorder, unspecified: Secondary | ICD-10-CM | POA: Diagnosis not present

## 2021-06-02 DIAGNOSIS — Z0000 Encounter for general adult medical examination without abnormal findings: Secondary | ICD-10-CM | POA: Diagnosis not present

## 2021-06-02 DIAGNOSIS — Z0001 Encounter for general adult medical examination with abnormal findings: Secondary | ICD-10-CM | POA: Diagnosis not present

## 2021-06-02 DIAGNOSIS — G039 Meningitis, unspecified: Secondary | ICD-10-CM | POA: Diagnosis not present

## 2021-06-02 DIAGNOSIS — T8463XA Infection and inflammatory reaction due to internal fixation device of spine, initial encounter: Secondary | ICD-10-CM | POA: Diagnosis not present

## 2021-06-02 DIAGNOSIS — Z6823 Body mass index (BMI) 23.0-23.9, adult: Secondary | ICD-10-CM | POA: Diagnosis not present

## 2021-06-12 DIAGNOSIS — G894 Chronic pain syndrome: Secondary | ICD-10-CM | POA: Diagnosis not present

## 2021-06-12 DIAGNOSIS — F4542 Pain disorder with related psychological factors: Secondary | ICD-10-CM | POA: Diagnosis not present

## 2021-06-12 DIAGNOSIS — E782 Mixed hyperlipidemia: Secondary | ICD-10-CM | POA: Diagnosis not present

## 2021-06-16 DIAGNOSIS — M1991 Primary osteoarthritis, unspecified site: Secondary | ICD-10-CM | POA: Diagnosis not present

## 2021-06-16 DIAGNOSIS — E663 Overweight: Secondary | ICD-10-CM | POA: Diagnosis not present

## 2021-06-16 DIAGNOSIS — G894 Chronic pain syndrome: Secondary | ICD-10-CM | POA: Diagnosis not present

## 2021-06-16 DIAGNOSIS — Z6823 Body mass index (BMI) 23.0-23.9, adult: Secondary | ICD-10-CM | POA: Diagnosis not present

## 2021-06-29 DIAGNOSIS — U071 COVID-19: Secondary | ICD-10-CM | POA: Diagnosis not present

## 2021-06-29 DIAGNOSIS — Z20822 Contact with and (suspected) exposure to covid-19: Secondary | ICD-10-CM | POA: Diagnosis not present

## 2021-06-29 DIAGNOSIS — G894 Chronic pain syndrome: Secondary | ICD-10-CM | POA: Diagnosis not present

## 2021-06-29 DIAGNOSIS — M5030 Other cervical disc degeneration, unspecified cervical region: Secondary | ICD-10-CM | POA: Diagnosis not present

## 2021-06-29 DIAGNOSIS — F4542 Pain disorder with related psychological factors: Secondary | ICD-10-CM | POA: Diagnosis not present

## 2021-07-16 DIAGNOSIS — G894 Chronic pain syndrome: Secondary | ICD-10-CM | POA: Diagnosis not present

## 2021-07-16 DIAGNOSIS — E663 Overweight: Secondary | ICD-10-CM | POA: Diagnosis not present

## 2021-07-16 DIAGNOSIS — Z6824 Body mass index (BMI) 24.0-24.9, adult: Secondary | ICD-10-CM | POA: Diagnosis not present

## 2021-07-16 DIAGNOSIS — M1991 Primary osteoarthritis, unspecified site: Secondary | ICD-10-CM | POA: Diagnosis not present

## 2021-07-16 DIAGNOSIS — M5030 Other cervical disc degeneration, unspecified cervical region: Secondary | ICD-10-CM | POA: Diagnosis not present

## 2021-08-13 DIAGNOSIS — Z6825 Body mass index (BMI) 25.0-25.9, adult: Secondary | ICD-10-CM | POA: Diagnosis not present

## 2021-08-13 DIAGNOSIS — J449 Chronic obstructive pulmonary disease, unspecified: Secondary | ICD-10-CM | POA: Diagnosis not present

## 2021-08-13 DIAGNOSIS — E663 Overweight: Secondary | ICD-10-CM | POA: Diagnosis not present

## 2021-08-13 DIAGNOSIS — G894 Chronic pain syndrome: Secondary | ICD-10-CM | POA: Diagnosis not present

## 2021-08-13 DIAGNOSIS — M5030 Other cervical disc degeneration, unspecified cervical region: Secondary | ICD-10-CM | POA: Diagnosis not present

## 2021-08-14 DIAGNOSIS — Z20822 Contact with and (suspected) exposure to covid-19: Secondary | ICD-10-CM | POA: Diagnosis not present

## 2021-09-14 DIAGNOSIS — E663 Overweight: Secondary | ICD-10-CM | POA: Diagnosis not present

## 2021-09-14 DIAGNOSIS — M1991 Primary osteoarthritis, unspecified site: Secondary | ICD-10-CM | POA: Diagnosis not present

## 2021-09-14 DIAGNOSIS — Z6825 Body mass index (BMI) 25.0-25.9, adult: Secondary | ICD-10-CM | POA: Diagnosis not present

## 2021-09-14 DIAGNOSIS — G894 Chronic pain syndrome: Secondary | ICD-10-CM | POA: Diagnosis not present

## 2021-09-14 DIAGNOSIS — J301 Allergic rhinitis due to pollen: Secondary | ICD-10-CM | POA: Diagnosis not present

## 2021-09-14 DIAGNOSIS — M5030 Other cervical disc degeneration, unspecified cervical region: Secondary | ICD-10-CM | POA: Diagnosis not present

## 2021-10-20 DIAGNOSIS — G894 Chronic pain syndrome: Secondary | ICD-10-CM | POA: Diagnosis not present

## 2021-10-20 DIAGNOSIS — I10 Essential (primary) hypertension: Secondary | ICD-10-CM | POA: Diagnosis not present

## 2021-10-20 DIAGNOSIS — M1991 Primary osteoarthritis, unspecified site: Secondary | ICD-10-CM | POA: Diagnosis not present

## 2021-10-20 DIAGNOSIS — J449 Chronic obstructive pulmonary disease, unspecified: Secondary | ICD-10-CM | POA: Diagnosis not present

## 2021-11-18 DIAGNOSIS — L570 Actinic keratosis: Secondary | ICD-10-CM | POA: Diagnosis not present

## 2021-11-18 DIAGNOSIS — X32XXXD Exposure to sunlight, subsequent encounter: Secondary | ICD-10-CM | POA: Diagnosis not present

## 2021-11-18 DIAGNOSIS — D044 Carcinoma in situ of skin of scalp and neck: Secondary | ICD-10-CM | POA: Diagnosis not present

## 2021-11-20 DIAGNOSIS — M1991 Primary osteoarthritis, unspecified site: Secondary | ICD-10-CM | POA: Diagnosis not present

## 2021-11-20 DIAGNOSIS — I10 Essential (primary) hypertension: Secondary | ICD-10-CM | POA: Diagnosis not present

## 2021-11-20 DIAGNOSIS — J449 Chronic obstructive pulmonary disease, unspecified: Secondary | ICD-10-CM | POA: Diagnosis not present

## 2021-11-20 DIAGNOSIS — G894 Chronic pain syndrome: Secondary | ICD-10-CM | POA: Diagnosis not present

## 2021-12-17 DIAGNOSIS — G894 Chronic pain syndrome: Secondary | ICD-10-CM | POA: Diagnosis not present

## 2021-12-17 DIAGNOSIS — M5030 Other cervical disc degeneration, unspecified cervical region: Secondary | ICD-10-CM | POA: Diagnosis not present

## 2021-12-17 DIAGNOSIS — I10 Essential (primary) hypertension: Secondary | ICD-10-CM | POA: Diagnosis not present

## 2021-12-17 DIAGNOSIS — M4626 Osteomyelitis of vertebra, lumbar region: Secondary | ICD-10-CM | POA: Diagnosis not present

## 2021-12-17 DIAGNOSIS — I700 Atherosclerosis of aorta: Secondary | ICD-10-CM | POA: Diagnosis not present

## 2021-12-17 DIAGNOSIS — E663 Overweight: Secondary | ICD-10-CM | POA: Diagnosis not present

## 2021-12-17 DIAGNOSIS — J449 Chronic obstructive pulmonary disease, unspecified: Secondary | ICD-10-CM | POA: Diagnosis not present

## 2021-12-17 DIAGNOSIS — Z6825 Body mass index (BMI) 25.0-25.9, adult: Secondary | ICD-10-CM | POA: Diagnosis not present

## 2021-12-17 DIAGNOSIS — M1991 Primary osteoarthritis, unspecified site: Secondary | ICD-10-CM | POA: Diagnosis not present

## 2022-01-15 DIAGNOSIS — Z1331 Encounter for screening for depression: Secondary | ICD-10-CM | POA: Diagnosis not present

## 2022-01-15 DIAGNOSIS — Z0001 Encounter for general adult medical examination with abnormal findings: Secondary | ICD-10-CM | POA: Diagnosis not present

## 2022-01-15 DIAGNOSIS — Z9229 Personal history of other drug therapy: Secondary | ICD-10-CM | POA: Diagnosis not present

## 2022-01-15 DIAGNOSIS — M4626 Osteomyelitis of vertebra, lumbar region: Secondary | ICD-10-CM | POA: Diagnosis not present

## 2022-01-15 DIAGNOSIS — Z6824 Body mass index (BMI) 24.0-24.9, adult: Secondary | ICD-10-CM | POA: Diagnosis not present

## 2022-01-15 DIAGNOSIS — F17290 Nicotine dependence, other tobacco product, uncomplicated: Secondary | ICD-10-CM | POA: Diagnosis not present

## 2022-01-15 DIAGNOSIS — I5032 Chronic diastolic (congestive) heart failure: Secondary | ICD-10-CM | POA: Diagnosis not present

## 2022-01-15 DIAGNOSIS — I700 Atherosclerosis of aorta: Secondary | ICD-10-CM | POA: Diagnosis not present

## 2022-01-15 DIAGNOSIS — F17200 Nicotine dependence, unspecified, uncomplicated: Secondary | ICD-10-CM | POA: Diagnosis not present

## 2022-01-15 DIAGNOSIS — M720 Palmar fascial fibromatosis [Dupuytren]: Secondary | ICD-10-CM | POA: Diagnosis not present

## 2022-01-15 DIAGNOSIS — J449 Chronic obstructive pulmonary disease, unspecified: Secondary | ICD-10-CM | POA: Diagnosis not present

## 2022-01-15 DIAGNOSIS — I10 Essential (primary) hypertension: Secondary | ICD-10-CM | POA: Diagnosis not present

## 2022-01-19 DIAGNOSIS — E039 Hypothyroidism, unspecified: Secondary | ICD-10-CM | POA: Diagnosis not present

## 2022-01-19 DIAGNOSIS — E782 Mixed hyperlipidemia: Secondary | ICD-10-CM | POA: Diagnosis not present

## 2022-01-19 DIAGNOSIS — Z125 Encounter for screening for malignant neoplasm of prostate: Secondary | ICD-10-CM | POA: Diagnosis not present

## 2022-01-19 DIAGNOSIS — Z0001 Encounter for general adult medical examination with abnormal findings: Secondary | ICD-10-CM | POA: Diagnosis not present

## 2022-01-19 DIAGNOSIS — Z9229 Personal history of other drug therapy: Secondary | ICD-10-CM | POA: Diagnosis not present

## 2022-01-19 DIAGNOSIS — E538 Deficiency of other specified B group vitamins: Secondary | ICD-10-CM | POA: Diagnosis not present

## 2022-01-19 DIAGNOSIS — I10 Essential (primary) hypertension: Secondary | ICD-10-CM | POA: Diagnosis not present

## 2022-01-19 DIAGNOSIS — E559 Vitamin D deficiency, unspecified: Secondary | ICD-10-CM | POA: Diagnosis not present

## 2022-02-12 ENCOUNTER — Telehealth: Payer: Self-pay | Admitting: *Deleted

## 2022-02-12 DIAGNOSIS — I10 Essential (primary) hypertension: Secondary | ICD-10-CM | POA: Diagnosis not present

## 2022-02-12 DIAGNOSIS — M5030 Other cervical disc degeneration, unspecified cervical region: Secondary | ICD-10-CM | POA: Diagnosis not present

## 2022-02-12 DIAGNOSIS — M62441 Contracture of muscle, right hand: Secondary | ICD-10-CM | POA: Diagnosis not present

## 2022-02-12 DIAGNOSIS — Z6825 Body mass index (BMI) 25.0-25.9, adult: Secondary | ICD-10-CM | POA: Diagnosis not present

## 2022-02-12 DIAGNOSIS — I5032 Chronic diastolic (congestive) heart failure: Secondary | ICD-10-CM | POA: Diagnosis not present

## 2022-02-12 DIAGNOSIS — M720 Palmar fascial fibromatosis [Dupuytren]: Secondary | ICD-10-CM | POA: Diagnosis not present

## 2022-02-12 DIAGNOSIS — F17290 Nicotine dependence, other tobacco product, uncomplicated: Secondary | ICD-10-CM | POA: Diagnosis not present

## 2022-02-12 DIAGNOSIS — F17200 Nicotine dependence, unspecified, uncomplicated: Secondary | ICD-10-CM | POA: Diagnosis not present

## 2022-02-12 DIAGNOSIS — E663 Overweight: Secondary | ICD-10-CM | POA: Diagnosis not present

## 2022-02-12 DIAGNOSIS — J449 Chronic obstructive pulmonary disease, unspecified: Secondary | ICD-10-CM | POA: Diagnosis not present

## 2022-02-12 NOTE — Patient Outreach (Signed)
  Care Coordination   02/12/2022 Name: Jordan May MRN: [id number] DOB: [birth year]   Care Coordination Outreach Attempts:  An unsuccessful telephone outreach was attempted today to offer the patient information about available care coordination services as a benefit of their health plan.   Follow Up Plan:  Additional outreach attempts will be made to offer the patient care coordination information and services.   Encounter Outcome:  No Answer  Care Coordination Interventions Activated:  No   Care Coordination Interventions:  No, not indicated    Chong Sicilian, BSN, RN-BC [place] / [facility], community Dial: [phone]

## 2022-03-15 DIAGNOSIS — G894 Chronic pain syndrome: Secondary | ICD-10-CM | POA: Diagnosis not present

## 2022-03-15 DIAGNOSIS — I10 Essential (primary) hypertension: Secondary | ICD-10-CM | POA: Diagnosis not present

## 2022-03-15 DIAGNOSIS — M5030 Other cervical disc degeneration, unspecified cervical region: Secondary | ICD-10-CM | POA: Diagnosis not present

## 2022-03-15 DIAGNOSIS — J449 Chronic obstructive pulmonary disease, unspecified: Secondary | ICD-10-CM | POA: Diagnosis not present

## 2022-03-25 ENCOUNTER — Ambulatory Visit: Payer: Self-pay | Admitting: *Deleted

## 2022-03-25 ENCOUNTER — Encounter: Payer: Self-pay | Admitting: *Deleted

## 2022-03-25 NOTE — Patient Instructions (Signed)
Visit Information  Thank you for taking time to visit with me today. Please don't hesitate to contact me if I can be of assistance to you.   Please call the care guide team at [phone] if you need to cancel or reschedule your appointment.   If you are experiencing a Mental Health or Behavioral Health Crisis or need someone to talk to, please call the Suicide and Crisis Lifeline: 988 call the [place] National Suicide Prevention Lifeline: 1-800-273-8255 or TTY: 1-800-799-4 TTY (1-800-799-4889) to talk to a trained counselor call 1-800-273-TALK (toll free, 24 hour hotline) go to [facility] [address], [place] ([phone]) call the [place] Crisis Line: 800-939-9988 call 911  Patient verbalizes understanding of instructions and care plan provided today and agrees to view in MyChart. Active MyChart status and patient understanding of how to access instructions and care plan via MyChart confirmed with patient.     No further follow up required.  Martiza Speth, BSW, MSW, LCSW  Licensed Clinical Social Worker  [facility]  Mailing Address-[address], [place], [place] [address] Physical Address-[address], [place], [place] [address] Toll Free Main # [phone] Fax # [phone] Cell # [phone] [email or web address]            

## 2022-03-25 NOTE — Patient Outreach (Signed)
  Care Coordination   Initial Visit Note   03/25/2022  Name: JEKHI BOLIN MRN: [id number] DOB: 08-07-1955  DACOTAH CABELLO is a 66 y.o. year old male who sees Redmond [employer], MD for primary care. I spoke with patient's wife, Stepehn Eckard by phone today.  What matters to the patients health and wellness today?  No Interventions Identified.   SDOH assessments and interventions completed:  Yes.  SDOH Interventions Today    Flowsheet Row Most Recent Value  SDOH Interventions   Food Insecurity Interventions Intervention Not Indicated, Other (Comment)  [Verified by Wife - Barry Interventions Intervention Not Indicated, Other (Comment)  [Verified by Wife Ozella Almond Hynson]  Transportation Interventions Intervention Not Indicated, Other (Comment)  [Verified by Wife Ozella Almond Bares]  Utilities Interventions Intervention Not Indicated, Other (Comment)  [Verified by Wife Ozella Almond Knauer]  Alcohol Usage Interventions Intervention Not Indicated (Score <7), Other (Comment)  [Verified by Wife Ozella Almond Credit]  Financial Strain Interventions Intervention Not Indicated, Other (Comment)  [Verified by Wife Ozella Almond Lares]  Physical Activity Interventions Patient Refused, Other (Comments)  [Verified by Wife Ozella Almond Brandl]  Stress Interventions Intervention Not Indicated, Other (Comment), Offered [employer]  [Verified by Wife - Ozella Almond Randol]  Social Connections Interventions Intervention Not Indicated, Other (Comment)  [Verified by Wife - [place] Coordination Interventions Activated:  Yes.   Care Coordination Interventions:  Yes, provided.   Follow up plan: No further intervention required.   Encounter Outcome:  Pt. Visit Completed.   Nat Christen, BSW, MSW, LCSW  Licensed Education officer, [facility]  Mailing [place] [address] [place], [place] [address] Physical Address-[address]  [place], [place] [address] Toll Free Main # [phone] Fax # [phone] Cell # [phone] [email or web address]

## 2022-03-29 DIAGNOSIS — I5032 Chronic diastolic (congestive) heart failure: Secondary | ICD-10-CM | POA: Diagnosis not present

## 2022-03-29 DIAGNOSIS — G894 Chronic pain syndrome: Secondary | ICD-10-CM | POA: Diagnosis not present

## 2022-03-29 DIAGNOSIS — M5030 Other cervical disc degeneration, unspecified cervical region: Secondary | ICD-10-CM | POA: Diagnosis not present

## 2022-03-29 DIAGNOSIS — M720 Palmar fascial fibromatosis [Dupuytren]: Secondary | ICD-10-CM | POA: Diagnosis not present

## 2022-03-29 DIAGNOSIS — E663 Overweight: Secondary | ICD-10-CM | POA: Diagnosis not present

## 2022-03-29 DIAGNOSIS — J449 Chronic obstructive pulmonary disease, unspecified: Secondary | ICD-10-CM | POA: Diagnosis not present

## 2022-03-29 DIAGNOSIS — Z6825 Body mass index (BMI) 25.0-25.9, adult: Secondary | ICD-10-CM | POA: Diagnosis not present

## 2022-03-29 DIAGNOSIS — I10 Essential (primary) hypertension: Secondary | ICD-10-CM | POA: Diagnosis not present

## 2022-04-27 DIAGNOSIS — J449 Chronic obstructive pulmonary disease, unspecified: Secondary | ICD-10-CM | POA: Diagnosis not present

## 2022-04-27 DIAGNOSIS — G894 Chronic pain syndrome: Secondary | ICD-10-CM | POA: Diagnosis not present

## 2022-04-27 DIAGNOSIS — I10 Essential (primary) hypertension: Secondary | ICD-10-CM | POA: Diagnosis not present

## 2022-04-27 DIAGNOSIS — I5032 Chronic diastolic (congestive) heart failure: Secondary | ICD-10-CM | POA: Diagnosis not present

## 2022-05-27 DIAGNOSIS — I10 Essential (primary) hypertension: Secondary | ICD-10-CM | POA: Diagnosis not present

## 2022-05-27 DIAGNOSIS — M5030 Other cervical disc degeneration, unspecified cervical region: Secondary | ICD-10-CM | POA: Diagnosis not present

## 2022-05-27 DIAGNOSIS — I5032 Chronic diastolic (congestive) heart failure: Secondary | ICD-10-CM | POA: Diagnosis not present

## 2022-05-27 DIAGNOSIS — G894 Chronic pain syndrome: Secondary | ICD-10-CM | POA: Diagnosis not present

## 2022-07-26 DIAGNOSIS — J449 Chronic obstructive pulmonary disease, unspecified: Secondary | ICD-10-CM | POA: Diagnosis not present

## 2022-07-26 DIAGNOSIS — M4626 Osteomyelitis of vertebra, lumbar region: Secondary | ICD-10-CM | POA: Diagnosis not present

## 2022-07-26 DIAGNOSIS — F419 Anxiety disorder, unspecified: Secondary | ICD-10-CM | POA: Diagnosis not present

## 2022-07-26 DIAGNOSIS — I5032 Chronic diastolic (congestive) heart failure: Secondary | ICD-10-CM | POA: Diagnosis not present

## 2022-07-26 DIAGNOSIS — I10 Essential (primary) hypertension: Secondary | ICD-10-CM | POA: Diagnosis not present

## 2022-07-26 DIAGNOSIS — M5030 Other cervical disc degeneration, unspecified cervical region: Secondary | ICD-10-CM | POA: Diagnosis not present

## 2022-07-26 DIAGNOSIS — E663 Overweight: Secondary | ICD-10-CM | POA: Diagnosis not present

## 2022-07-26 DIAGNOSIS — Z6826 Body mass index (BMI) 26.0-26.9, adult: Secondary | ICD-10-CM | POA: Diagnosis not present

## 2022-07-26 DIAGNOSIS — I700 Atherosclerosis of aorta: Secondary | ICD-10-CM | POA: Diagnosis not present

## 2022-07-26 DIAGNOSIS — H6120 Impacted cerumen, unspecified ear: Secondary | ICD-10-CM | POA: Diagnosis not present

## 2022-08-11 DIAGNOSIS — H40033 Anatomical narrow angle, bilateral: Secondary | ICD-10-CM | POA: Diagnosis not present

## 2022-08-11 DIAGNOSIS — H2513 Age-related nuclear cataract, bilateral: Secondary | ICD-10-CM | POA: Diagnosis not present

## 2022-08-24 DIAGNOSIS — I5032 Chronic diastolic (congestive) heart failure: Secondary | ICD-10-CM | POA: Diagnosis not present

## 2022-08-24 DIAGNOSIS — G894 Chronic pain syndrome: Secondary | ICD-10-CM | POA: Diagnosis not present

## 2022-08-24 DIAGNOSIS — I700 Atherosclerosis of aorta: Secondary | ICD-10-CM | POA: Diagnosis not present

## 2022-08-24 DIAGNOSIS — E663 Overweight: Secondary | ICD-10-CM | POA: Diagnosis not present

## 2022-08-24 DIAGNOSIS — Z6826 Body mass index (BMI) 26.0-26.9, adult: Secondary | ICD-10-CM | POA: Diagnosis not present

## 2022-08-24 DIAGNOSIS — M1991 Primary osteoarthritis, unspecified site: Secondary | ICD-10-CM | POA: Diagnosis not present

## 2022-08-24 DIAGNOSIS — J449 Chronic obstructive pulmonary disease, unspecified: Secondary | ICD-10-CM | POA: Diagnosis not present

## 2022-09-23 DIAGNOSIS — M5030 Other cervical disc degeneration, unspecified cervical region: Secondary | ICD-10-CM | POA: Diagnosis not present

## 2022-09-23 DIAGNOSIS — I700 Atherosclerosis of aorta: Secondary | ICD-10-CM | POA: Diagnosis not present

## 2022-09-23 DIAGNOSIS — I10 Essential (primary) hypertension: Secondary | ICD-10-CM | POA: Diagnosis not present

## 2022-09-23 DIAGNOSIS — G894 Chronic pain syndrome: Secondary | ICD-10-CM | POA: Diagnosis not present

## 2022-09-23 DIAGNOSIS — E663 Overweight: Secondary | ICD-10-CM | POA: Diagnosis not present

## 2022-09-23 DIAGNOSIS — I5032 Chronic diastolic (congestive) heart failure: Secondary | ICD-10-CM | POA: Diagnosis not present

## 2022-09-23 DIAGNOSIS — Z6826 Body mass index (BMI) 26.0-26.9, adult: Secondary | ICD-10-CM | POA: Diagnosis not present

## 2022-09-23 DIAGNOSIS — J449 Chronic obstructive pulmonary disease, unspecified: Secondary | ICD-10-CM | POA: Diagnosis not present

## 2022-09-23 DIAGNOSIS — M4626 Osteomyelitis of vertebra, lumbar region: Secondary | ICD-10-CM | POA: Diagnosis not present

## 2022-10-22 DIAGNOSIS — G894 Chronic pain syndrome: Secondary | ICD-10-CM | POA: Diagnosis not present

## 2022-10-22 DIAGNOSIS — J449 Chronic obstructive pulmonary disease, unspecified: Secondary | ICD-10-CM | POA: Diagnosis not present

## 2022-10-22 DIAGNOSIS — M5030 Other cervical disc degeneration, unspecified cervical region: Secondary | ICD-10-CM | POA: Diagnosis not present

## 2022-10-22 DIAGNOSIS — I10 Essential (primary) hypertension: Secondary | ICD-10-CM | POA: Diagnosis not present

## 2022-11-01 DIAGNOSIS — C4441 Basal cell carcinoma of skin of scalp and neck: Secondary | ICD-10-CM | POA: Diagnosis not present

## 2022-11-01 DIAGNOSIS — Z08 Encounter for follow-up examination after completed treatment for malignant neoplasm: Secondary | ICD-10-CM | POA: Diagnosis not present

## 2022-11-01 DIAGNOSIS — D044 Carcinoma in situ of skin of scalp and neck: Secondary | ICD-10-CM | POA: Diagnosis not present

## 2022-11-01 DIAGNOSIS — Z85828 Personal history of other malignant neoplasm of skin: Secondary | ICD-10-CM | POA: Diagnosis not present

## 2022-11-19 DIAGNOSIS — I10 Essential (primary) hypertension: Secondary | ICD-10-CM | POA: Diagnosis not present

## 2022-11-19 DIAGNOSIS — G894 Chronic pain syndrome: Secondary | ICD-10-CM | POA: Diagnosis not present

## 2022-11-19 DIAGNOSIS — M5030 Other cervical disc degeneration, unspecified cervical region: Secondary | ICD-10-CM | POA: Diagnosis not present

## 2022-12-23 DIAGNOSIS — I700 Atherosclerosis of aorta: Secondary | ICD-10-CM | POA: Diagnosis not present

## 2022-12-23 DIAGNOSIS — M1991 Primary osteoarthritis, unspecified site: Secondary | ICD-10-CM | POA: Diagnosis not present

## 2022-12-23 DIAGNOSIS — G894 Chronic pain syndrome: Secondary | ICD-10-CM | POA: Diagnosis not present

## 2022-12-23 DIAGNOSIS — Z6826 Body mass index (BMI) 26.0-26.9, adult: Secondary | ICD-10-CM | POA: Diagnosis not present

## 2022-12-23 DIAGNOSIS — E663 Overweight: Secondary | ICD-10-CM | POA: Diagnosis not present

## 2022-12-23 DIAGNOSIS — I10 Essential (primary) hypertension: Secondary | ICD-10-CM | POA: Diagnosis not present

## 2022-12-23 DIAGNOSIS — M5030 Other cervical disc degeneration, unspecified cervical region: Secondary | ICD-10-CM | POA: Diagnosis not present

## 2023-01-21 DIAGNOSIS — I10 Essential (primary) hypertension: Secondary | ICD-10-CM | POA: Diagnosis not present

## 2023-01-21 DIAGNOSIS — I5032 Chronic diastolic (congestive) heart failure: Secondary | ICD-10-CM | POA: Diagnosis not present

## 2023-01-21 DIAGNOSIS — J449 Chronic obstructive pulmonary disease, unspecified: Secondary | ICD-10-CM | POA: Diagnosis not present

## 2023-01-21 DIAGNOSIS — G894 Chronic pain syndrome: Secondary | ICD-10-CM | POA: Diagnosis not present

## 2023-02-16 DIAGNOSIS — I5032 Chronic diastolic (congestive) heart failure: Secondary | ICD-10-CM | POA: Diagnosis not present

## 2023-02-16 DIAGNOSIS — J449 Chronic obstructive pulmonary disease, unspecified: Secondary | ICD-10-CM | POA: Diagnosis not present

## 2023-02-16 DIAGNOSIS — G894 Chronic pain syndrome: Secondary | ICD-10-CM | POA: Diagnosis not present

## 2023-02-16 DIAGNOSIS — I10 Essential (primary) hypertension: Secondary | ICD-10-CM | POA: Diagnosis not present

## 2023-03-16 DIAGNOSIS — J449 Chronic obstructive pulmonary disease, unspecified: Secondary | ICD-10-CM | POA: Diagnosis not present

## 2023-03-16 DIAGNOSIS — M1991 Primary osteoarthritis, unspecified site: Secondary | ICD-10-CM | POA: Diagnosis not present

## 2023-03-16 DIAGNOSIS — G894 Chronic pain syndrome: Secondary | ICD-10-CM | POA: Diagnosis not present

## 2023-03-16 DIAGNOSIS — M5030 Other cervical disc degeneration, unspecified cervical region: Secondary | ICD-10-CM | POA: Diagnosis not present

## 2023-03-16 DIAGNOSIS — I5032 Chronic diastolic (congestive) heart failure: Secondary | ICD-10-CM | POA: Diagnosis not present

## 2023-03-16 DIAGNOSIS — I10 Essential (primary) hypertension: Secondary | ICD-10-CM | POA: Diagnosis not present

## 2023-03-16 DIAGNOSIS — I700 Atherosclerosis of aorta: Secondary | ICD-10-CM | POA: Diagnosis not present

## 2023-03-16 DIAGNOSIS — F419 Anxiety disorder, unspecified: Secondary | ICD-10-CM | POA: Diagnosis not present

## 2023-03-16 DIAGNOSIS — E663 Overweight: Secondary | ICD-10-CM | POA: Diagnosis not present

## 2023-03-16 DIAGNOSIS — Z6826 Body mass index (BMI) 26.0-26.9, adult: Secondary | ICD-10-CM | POA: Diagnosis not present

## 2023-04-03 ENCOUNTER — Encounter (HOSPITAL_COMMUNITY): Payer: Self-pay

## 2023-04-03 ENCOUNTER — Other Ambulatory Visit: Payer: Self-pay

## 2023-04-03 ENCOUNTER — Emergency Department (HOSPITAL_COMMUNITY): Payer: HMO

## 2023-04-03 ENCOUNTER — Emergency Department (HOSPITAL_COMMUNITY)
Admission: EM | Admit: 2023-04-03 | Discharge: 2023-04-03 | Disposition: A | Discharge status: Home / Self Care | Payer: HMO | Attending: Emergency Medicine | Admitting: Emergency Medicine

## 2023-04-03 DIAGNOSIS — K529 Noninfective gastroenteritis and colitis, unspecified: Secondary | ICD-10-CM | POA: Diagnosis not present

## 2023-04-03 DIAGNOSIS — K5900 Constipation, unspecified: Secondary | ICD-10-CM | POA: Insufficient documentation

## 2023-04-03 DIAGNOSIS — F17200 Nicotine dependence, unspecified, uncomplicated: Secondary | ICD-10-CM | POA: Diagnosis not present

## 2023-04-03 DIAGNOSIS — K4020 Bilateral inguinal hernia, without obstruction or gangrene, not specified as recurrent: Secondary | ICD-10-CM | POA: Diagnosis not present

## 2023-04-03 DIAGNOSIS — R109 Unspecified abdominal pain: Secondary | ICD-10-CM | POA: Diagnosis not present

## 2023-04-03 DIAGNOSIS — R000 Tachycardia, unspecified: Secondary | ICD-10-CM | POA: Diagnosis not present

## 2023-04-03 LAB — CBC WITH DIFFERENTIAL/PLATELET
Abs Immature Granulocytes: 0.04 10*3/uL (ref 0.00–0.07)
Basophils Absolute: 0.1 10*3/uL (ref 0.0–0.1)
Basophils Relative: 1 %
Eosinophils Absolute: 0 10*3/uL (ref 0.0–0.5)
Eosinophils Relative: 0 %
HCT: 45 % (ref 39.0–52.0)
Hemoglobin: 15.1 g/dL (ref 13.0–17.0)
Immature Granulocytes: 0 %
Lymphocytes Relative: 16 %
Lymphs Abs: 1.7 10*3/uL (ref 0.7–4.0)
MCH: 31.8 pg (ref 26.0–34.0)
MCHC: 33.6 g/dL (ref 30.0–36.0)
MCV: 94.7 fL (ref 80.0–100.0)
Monocytes Absolute: 1.1 10*3/uL — ABNORMAL HIGH (ref 0.1–1.0)
Monocytes Relative: 10 %
Neutro Abs: 7.9 10*3/uL — ABNORMAL HIGH (ref 1.7–7.7)
Neutrophils Relative %: 73 %
Platelets: 354 10*3/uL (ref 150–400)
RBC: 4.75 MIL/uL (ref 4.22–5.81)
RDW: 13.4 % (ref 11.5–15.5)
WBC: 10.8 10*3/uL — ABNORMAL HIGH (ref 4.0–10.5)
nRBC: 0 % (ref 0.0–0.2)

## 2023-04-03 LAB — COMPREHENSIVE METABOLIC PANEL
ALT: 12 U/L (ref 0–44)
AST: 16 U/L (ref 15–41)
Albumin: 3.3 g/dL — ABNORMAL LOW (ref 3.5–5.0)
Alkaline Phosphatase: 122 U/L (ref 38–126)
Anion gap: 11 (ref 5–15)
BUN: 13 mg/dL (ref 8–23)
CO2: 25 mmol/L (ref 22–32)
Calcium: 9.1 mg/dL (ref 8.9–10.3)
Chloride: 100 mmol/L (ref 98–111)
Creatinine, Ser: 1.28 mg/dL — ABNORMAL HIGH (ref 0.61–1.24)
GFR, Estimated: >60 mL/min (ref >60)
Glucose, Bld: 137 mg/dL — ABNORMAL HIGH (ref 70–99)
Potassium: 3.9 mmol/L (ref 3.5–5.1)
Sodium: 136 mmol/L (ref 135–145)
Total Bilirubin: 0.6 mg/dL (ref 0.3–1.2)
Total Protein: 7.2 g/dL (ref 6.5–8.1)

## 2023-04-03 LAB — LACTIC ACID, PLASMA: Lactic Acid, Venous: 2.2 mmol/L (ref 0.5–1.9)

## 2023-04-03 LAB — LIPASE, BLOOD: Lipase: 20 U/L (ref 11–51)

## 2023-04-03 MED ORDER — ONDANSETRON HCL 4 MG/2ML IJ SOLN
4.0000 mg | Freq: Once | INTRAMUSCULAR | Status: AC
Start: 2023-04-03 — End: 2023-04-03
  Administered 2023-04-03: 4 mg via INTRAVENOUS
  Filled 2023-04-03: qty 2

## 2023-04-03 MED ORDER — HYDROMORPHONE HCL 1 MG/ML IJ SOLN
1.0000 mg | Freq: Once | INTRAMUSCULAR | Status: AC
  Administered 2023-04-03: 1 mg via INTRAVENOUS
  Filled 2023-04-03: qty 1

## 2023-04-03 MED ORDER — SODIUM CHLORIDE 0.9 % IV BOLUS
1000.0000 mL | Freq: Once | INTRAVENOUS | Status: AC
  Administered 2023-04-03: 1000 mL via INTRAVENOUS

## 2023-04-03 MED ORDER — IOHEXOL 300 MG/ML  SOLN
100.0000 mL | Freq: Once | INTRAMUSCULAR | Status: AC | PRN
  Administered 2023-04-03: 100 mL via INTRAVENOUS

## 2023-04-03 NOTE — ED Notes (Signed)
Pt reports he had a large bowel movement. He describes this as diarrhea. Denies blood present. He reports he feels much better. Denies abd pain. B Tyrae Alcoser RN

## 2023-04-03 NOTE — Discharge Instructions (Signed)
Thankfully your testing today did not reveal any signs of surgical problems in your abdomen.  It did appear that you are very constipated.  For this reason I would like for you to take the following medications  Colace twice a day  MiraLAX 1 scoop 3 times a day, this is over-the-counter  When you are having regular soft or loose stools you can cut back and then follow-up with your family doctor.  That being said I would like for you to return to the ER for severe or worsening symptoms

## 2023-04-03 NOTE — ED Triage Notes (Signed)
C/o constipation and lower abd pain with rectal pressure and unable to pass gas.  Unk last BM.  Denies n/v

## 2023-04-03 NOTE — ED Provider Notes (Signed)
[place] EMERGENCY DEPARTMENT AT [facility] Provider Note   CSN: [id number] Arrival date & time: 04/03/23  1829     History  Chief Complaint  Patient presents with   Constipation    Jordan May is a 67 y.o. male.   Constipation  67 year old male with a history of heavy tobacco use, he reports that he has had about a week of some abdominal discomfort, he has not had a bowel movement in about 7 days but has had worsening abdominal pain and presented tachycardic.  He is nauseated but not vomiting, he has had no diarrhea no rectal bleeding, no urinary symptoms and has been able to freely pass urine today.  He does endorse a prior history of appendicitis, no other abdominal surgical history in the past and has never had a bowel obstruction.  This patient has not had fevers or chills, the pain does not radiate to his back, it is a poorly described and poorly located pain.    Home Medications Prior to Admission medications   Medication Sig Start Date End Date Taking? Authorizing Provider  COLCRYS 0.6 MG tablet Take 0.6 mg by mouth 2 (two) times daily. As needed for  gout 02/27/19   [provider]  folic acid (FOLVITE) 1 MG tablet Take 1 tablet (1 mg total) by mouth daily. 05/15/21   Catarina Hartshorn, MD  HYDROcodone-acetaminophen (NORCO) 10-325 MG per tablet Take 1 tablet by mouth every 6 (six) hours as needed. pain 01/31/14   [provider]  meropenem 2 g in sodium chloride 0.9 % 100 mL Inject 2 g into the vein every 12 (twelve) hours. Last dose on 05/20/21 05/14/21   Catarina Hartshorn, MD  sildenafil (REVATIO) 20 MG tablet Take 20 mg by mouth 2 (two) times daily. 06/06/19   [provider]  vancomycin 1,500 mg in sodium chloride 0.9 % 250 mL Inject 1,000 mg into the vein every other day.    [provider]  vitamin B-12 (CYANOCOBALAMIN) 500 MCG tablet Take 1 tablet (500 mcg total) by mouth daily. 05/15/21   Catarina Hartshorn, MD      Allergies    Gabapentin,  Penicillins, Oxycodone, and Penicillamine    Review of Systems   Review of Systems  Gastrointestinal:  Positive for constipation.  All other systems reviewed and are negative.   Physical Exam Updated Vital Signs BP 109/83   Pulse 98   Temp 97.9 F (36.6 C) (Oral)   Resp (!) 23   Wt 78 kg   SpO2 93%   BMI 26.15 kg/m  Physical Exam Vitals and nursing note reviewed.  Constitutional:      General: He is not in acute distress.    Appearance: He is well-developed.  HENT:     Head: Normocephalic and atraumatic.     Mouth/Throat:     Pharynx: No oropharyngeal exudate.  Eyes:     General: No scleral icterus.       Right eye: No discharge.        Left eye: No discharge.     Conjunctiva/sclera: Conjunctivae normal.     Pupils: Pupils are equal, round, and reactive to light.  Neck:     Thyroid: No thyromegaly.     Vascular: No JVD.  Cardiovascular:     Rate and Rhythm: Normal rate and regular rhythm.     Heart sounds: Normal heart sounds. No murmur heard.    No friction rub. No gallop.  Pulmonary:  Effort: Pulmonary effort is normal. No respiratory distress.     Breath sounds: Normal breath sounds. No wheezing or rales.  Abdominal:     General: Bowel sounds are normal. There is no distension.     Palpations: Abdomen is soft. There is no mass.     Tenderness: There is abdominal tenderness.     Comments: Mild diffuse abdominal pain, mild guarding, mild peritoneal signs, no tympanitic sounds to percussion  Musculoskeletal:        General: No tenderness. Normal range of motion.     Cervical back: Normal range of motion and neck supple.     Right lower leg: No edema.     Left lower leg: No edema.  Lymphadenopathy:     Cervical: No cervical adenopathy.  Skin:    General: Skin is warm and dry.     Findings: No erythema or rash.  Neurological:     General: No focal deficit present.     Mental Status: He is alert.     Coordination: Coordination normal.  Psychiatric:         Behavior: Behavior normal.     ED Results / Procedures / Treatments   Labs (all labs ordered are listed, but only abnormal results are displayed) Labs Reviewed  CBC WITH DIFFERENTIAL/PLATELET - Abnormal; Notable for the following components:      Result Value   WBC 10.8 (*)    Neutro Abs 7.9 (*)    Monocytes Absolute 1.1 (*)    All other components within normal limits  COMPREHENSIVE METABOLIC PANEL - Abnormal; Notable for the following components:   Glucose, Bld 137 (*)    Creatinine, Ser 1.28 (*)    Albumin 3.3 (*)    All other components within normal limits  LACTIC ACID, PLASMA - Abnormal; Notable for the following components:   Lactic Acid, Venous 2.2 (*)    All other components within normal limits  LIPASE, BLOOD  LACTIC ACID, PLASMA    EKG EKG Interpretation Date/Time:  Sunday April 03 2023 19:11:42 EDT Ventricular Rate:  110 PR Interval:  125 QRS Duration:  87 QT Interval:  318 QTC Calculation: 431 R Axis:   93  Text Interpretation: Sinus tachycardia Right axis deviation Confirmed by Eber Hong ([id number]) on 04/03/2023 7:27:37 PM  Radiology CT ABDOMEN PELVIS W CONTRAST  Result Date: 04/03/2023 CLINICAL DATA:  Acute nonlocalized abdominal pain, constipation, obstipation EXAM: CT ABDOMEN AND PELVIS WITH CONTRAST TECHNIQUE: Multidetector CT imaging of the abdomen and pelvis was performed using the standard protocol following bolus administration of intravenous contrast. RADIATION DOSE REDUCTION: This exam was performed according to the departmental dose-optimization program which includes automated exposure control, adjustment of the mA and/or kV according to patient size and/or use of iterative reconstruction technique. CONTRAST:  OMNIPAQUE IOHEXOL 300 MG/ML  SOLN COMPARISON:  03/22/2019 FINDINGS: Lower chest: No acute abnormality. Bibasilar pulmonary fibrotic change. Small left pleural effusion Hepatobiliary: No focal liver abnormality is seen. No  gallstones, gallbladder wall thickening, or biliary dilatation. Pancreas: Unremarkable Spleen: Unremarkable Adrenals/Urinary Tract: Adrenal glands are unremarkable. Kidneys are normal, without renal calculi, focal lesion, or hydronephrosis. Bladder is unremarkable. Stomach/Bowel: Moderate formed stool within the distal colon and rectal vault noted, possibly reflecting changes of fecal impaction. The colon proximal to this appears diffusely fluid-filled, a finding that can be seen in the setting of enterocolitis and may present clinically as diarrhea. No evidence of obstruction. Stomach, small bowel, and large bowel are otherwise unremarkable. Appendix absent. No free  intraperitoneal gas or fluid. Vascular/Lymphatic: Aortic atherosclerosis. No enlarged abdominal or pelvic lymph nodes. Reproductive: Prostate is unremarkable. Other: Small fat containing umbilical and bilateral inguinal hernias are present. Musculoskeletal: No acute bone abnormality. No lytic or blastic bone lesion. L2-L4 posterior lumbar fusion with instrumentation and L3 posterior decompression has been performed. IMPRESSION: 1. Moderate formed stool within the distal colon and rectal vault, possibly reflecting changes of fecal impaction. The colon proximal to this appears diffusely fluid-filled, a finding that can be seen in the setting of enterocolitis and may present clinically as diarrhea. No evidence of obstruction. Aortic Atherosclerosis (ICD10-I70.0). Electronically Signed   By: Helyn Numbers M.D.   On: 04/03/2023 21:48   DG Chest Port 1 View  Result Date: 04/03/2023 CLINICAL DATA:  Abdominal pain EXAM: PORTABLE CHEST 1 VIEW COMPARISON:  05/12/2021 FINDINGS: Cardiac shadow is within normal limits. Lungs are hyperinflated with chronic fibrotic changes stable in appearance from the prior study. No acute infiltrate is seen. No free air is noted. Chronic blunting of left costophrenic angle is noted. Old rib fractures are noted on the left.  IMPRESSION: Chronic changes without acute abnormality. Electronically Signed   By: Alcide Clever M.D.   On: 04/03/2023 20:44    Procedures Procedures    Medications Ordered in ED Medications  sodium chloride 0.9 % bolus 1,000 mL (0 mLs Intravenous Stopped 04/03/23 2114)  ondansetron (ZOFRAN) injection 4 mg (4 mg Intravenous Given 04/03/23 2008)  HYDROmorphone (DILAUDID) injection 1 mg (1 mg Intravenous Given 04/03/23 2009)  iohexol (OMNIPAQUE) 300 MG/ML solution 100 mL (100 mLs Intravenous Contrast Given 04/03/23 2048)    ED Course/ Medical Decision Making/ A&P                                 Medical Decision Making Amount and/or Complexity of Data Reviewed Labs: ordered. Radiology: ordered.  Risk Prescription drug management.    This patient presents to the ED for concern of abdominal pain with lack of bowel movements, this involves an extensive number of treatment options, and is a complaint that carries with it a high risk of complications and morbidity.  The differential diagnosis includes bowel obstruction, perforation, diverticulitis, could be any number of other problems such as aneurysm or severe constipation   Co morbidities that complicate the patient evaluation  Heavy smoker, prior appendicitis   Additional history obtained:  Additional history obtained from medical record External records from outside source obtained and reviewed including has chronic pain, prior history of altered mental status and meningitis as well as osteomyelitis of the lumbar spine Medication was reviewed, the patient is on opiate medications, has had colchicine, he is currently on both Percocet and Xanax and gets monthly refills   Lab Tests:  I Ordered, and personally interpreted labs.  The pertinent results include: No leukocytosis, no anemia, metabolic panel is reassuring, creatinine is better than baseline, lactate is 2.2, lipase is 20   Imaging Studies ordered:  I ordered  imaging studies including CT scan of the abdomen and pelvis I independently visualized and interpreted imaging which showed no acute findings to suggest a surgical cause I agree with the radiologist interpretation   Cardiac Monitoring: / EKG:  The patient was maintained on a cardiac monitor.  I personally viewed and interpreted the cardiac monitored which showed an underlying rhythm of: Sinus rhythm   Problem List / ED Course / Critical interventions / Medication management  The patient  has a CT scan that is reassuring, he had a large bowel movement and states he feels much better and wants to go home, I think this is reasonable  I have reviewed the patients home medicines and have made adjustments as needed   Social Determinants of Health:  None   Test / Admission - Considered:  Considered admission but the patient improved significantly and is stable for discharge         Final Clinical Impression(s) / ED Diagnoses Final diagnoses:  Constipation, unspecified constipation type    Rx / DC Orders ED Discharge Orders     None         Eber Hong, MD 04/03/23 2153

## 2023-04-29 DIAGNOSIS — M5030 Other cervical disc degeneration, unspecified cervical region: Secondary | ICD-10-CM | POA: Diagnosis not present

## 2023-04-29 DIAGNOSIS — J449 Chronic obstructive pulmonary disease, unspecified: Secondary | ICD-10-CM | POA: Diagnosis not present

## 2023-04-29 DIAGNOSIS — M62441 Contracture of muscle, right hand: Secondary | ICD-10-CM | POA: Diagnosis not present

## 2023-04-29 DIAGNOSIS — G894 Chronic pain syndrome: Secondary | ICD-10-CM | POA: Diagnosis not present

## 2023-04-29 DIAGNOSIS — I10 Essential (primary) hypertension: Secondary | ICD-10-CM | POA: Diagnosis not present

## 2023-04-29 DIAGNOSIS — I700 Atherosclerosis of aorta: Secondary | ICD-10-CM | POA: Diagnosis not present

## 2023-04-29 DIAGNOSIS — Z1331 Encounter for screening for depression: Secondary | ICD-10-CM | POA: Diagnosis not present

## 2023-04-29 DIAGNOSIS — F17290 Nicotine dependence, other tobacco product, uncomplicated: Secondary | ICD-10-CM | POA: Diagnosis not present

## 2023-04-29 DIAGNOSIS — Z6824 Body mass index (BMI) 24.0-24.9, adult: Secondary | ICD-10-CM | POA: Diagnosis not present

## 2023-04-29 DIAGNOSIS — Z0001 Encounter for general adult medical examination with abnormal findings: Secondary | ICD-10-CM | POA: Diagnosis not present

## 2023-04-29 DIAGNOSIS — I5032 Chronic diastolic (congestive) heart failure: Secondary | ICD-10-CM | POA: Diagnosis not present

## 2023-05-09 DIAGNOSIS — Z0001 Encounter for general adult medical examination with abnormal findings: Secondary | ICD-10-CM | POA: Diagnosis not present

## 2023-05-09 DIAGNOSIS — I10 Essential (primary) hypertension: Secondary | ICD-10-CM | POA: Diagnosis not present

## 2023-05-25 DIAGNOSIS — J449 Chronic obstructive pulmonary disease, unspecified: Secondary | ICD-10-CM | POA: Diagnosis not present

## 2023-05-25 DIAGNOSIS — G894 Chronic pain syndrome: Secondary | ICD-10-CM | POA: Diagnosis not present

## 2023-05-25 DIAGNOSIS — M5030 Other cervical disc degeneration, unspecified cervical region: Secondary | ICD-10-CM | POA: Diagnosis not present

## 2023-05-25 DIAGNOSIS — I10 Essential (primary) hypertension: Secondary | ICD-10-CM | POA: Diagnosis not present

## 2023-06-28 DIAGNOSIS — M5030 Other cervical disc degeneration, unspecified cervical region: Secondary | ICD-10-CM | POA: Diagnosis not present

## 2023-06-28 DIAGNOSIS — G894 Chronic pain syndrome: Secondary | ICD-10-CM | POA: Diagnosis not present

## 2023-06-28 DIAGNOSIS — I10 Essential (primary) hypertension: Secondary | ICD-10-CM | POA: Diagnosis not present

## 2023-06-28 DIAGNOSIS — J449 Chronic obstructive pulmonary disease, unspecified: Secondary | ICD-10-CM | POA: Diagnosis not present

## 2023-07-22 DIAGNOSIS — D72829 Elevated white blood cell count, unspecified: Secondary | ICD-10-CM | POA: Diagnosis not present

## 2023-07-25 DIAGNOSIS — Z6824 Body mass index (BMI) 24.0-24.9, adult: Secondary | ICD-10-CM | POA: Diagnosis not present

## 2023-07-25 DIAGNOSIS — G894 Chronic pain syndrome: Secondary | ICD-10-CM | POA: Diagnosis not present

## 2023-07-25 DIAGNOSIS — I700 Atherosclerosis of aorta: Secondary | ICD-10-CM | POA: Diagnosis not present

## 2023-07-25 DIAGNOSIS — M5030 Other cervical disc degeneration, unspecified cervical region: Secondary | ICD-10-CM | POA: Diagnosis not present

## 2023-07-25 DIAGNOSIS — I10 Essential (primary) hypertension: Secondary | ICD-10-CM | POA: Diagnosis not present

## 2023-07-25 DIAGNOSIS — I5032 Chronic diastolic (congestive) heart failure: Secondary | ICD-10-CM | POA: Diagnosis not present

## 2023-07-25 DIAGNOSIS — J449 Chronic obstructive pulmonary disease, unspecified: Secondary | ICD-10-CM | POA: Diagnosis not present

## 2023-07-27 ENCOUNTER — Ambulatory Visit: Payer: Self-pay | Admitting: Urology

## 2023-08-23 DIAGNOSIS — I10 Essential (primary) hypertension: Secondary | ICD-10-CM | POA: Diagnosis not present

## 2023-08-23 DIAGNOSIS — I5032 Chronic diastolic (congestive) heart failure: Secondary | ICD-10-CM | POA: Diagnosis not present

## 2023-08-23 DIAGNOSIS — J449 Chronic obstructive pulmonary disease, unspecified: Secondary | ICD-10-CM | POA: Diagnosis not present

## 2023-08-23 DIAGNOSIS — M5030 Other cervical disc degeneration, unspecified cervical region: Secondary | ICD-10-CM | POA: Diagnosis not present

## 2023-08-23 DIAGNOSIS — Z6823 Body mass index (BMI) 23.0-23.9, adult: Secondary | ICD-10-CM | POA: Diagnosis not present

## 2023-08-23 DIAGNOSIS — G894 Chronic pain syndrome: Secondary | ICD-10-CM | POA: Diagnosis not present

## 2023-09-23 DIAGNOSIS — I5032 Chronic diastolic (congestive) heart failure: Secondary | ICD-10-CM | POA: Diagnosis not present

## 2023-09-23 DIAGNOSIS — M4626 Osteomyelitis of vertebra, lumbar region: Secondary | ICD-10-CM | POA: Diagnosis not present

## 2023-09-23 DIAGNOSIS — L8991 Pressure ulcer of unspecified site, stage 1: Secondary | ICD-10-CM | POA: Diagnosis not present

## 2023-09-23 DIAGNOSIS — G894 Chronic pain syndrome: Secondary | ICD-10-CM | POA: Diagnosis not present

## 2023-09-23 DIAGNOSIS — I10 Essential (primary) hypertension: Secondary | ICD-10-CM | POA: Diagnosis not present

## 2023-09-23 DIAGNOSIS — Z6822 Body mass index (BMI) 22.0-22.9, adult: Secondary | ICD-10-CM | POA: Diagnosis not present

## 2023-09-23 DIAGNOSIS — M5030 Other cervical disc degeneration, unspecified cervical region: Secondary | ICD-10-CM | POA: Diagnosis not present

## 2023-09-23 DIAGNOSIS — I700 Atherosclerosis of aorta: Secondary | ICD-10-CM | POA: Diagnosis not present

## 2023-09-23 DIAGNOSIS — J449 Chronic obstructive pulmonary disease, unspecified: Secondary | ICD-10-CM | POA: Diagnosis not present

## 2023-09-23 DIAGNOSIS — M720 Palmar fascial fibromatosis [Dupuytren]: Secondary | ICD-10-CM | POA: Diagnosis not present

## 2023-09-23 DIAGNOSIS — F17290 Nicotine dependence, other tobacco product, uncomplicated: Secondary | ICD-10-CM | POA: Diagnosis not present

## 2023-09-23 DIAGNOSIS — F17200 Nicotine dependence, unspecified, uncomplicated: Secondary | ICD-10-CM | POA: Diagnosis not present

## 2023-10-16 ENCOUNTER — Other Ambulatory Visit: Payer: Self-pay

## 2023-10-16 ENCOUNTER — Emergency Department (HOSPITAL_COMMUNITY)
Admission: EM | Admit: 2023-10-16 | Discharge: 2023-10-16 | Disposition: A | Discharge status: Home / Self Care | Attending: Emergency Medicine | Admitting: Emergency Medicine

## 2023-10-16 ENCOUNTER — Encounter (HOSPITAL_COMMUNITY): Payer: Self-pay

## 2023-10-16 DIAGNOSIS — M545 Low back pain, unspecified: Secondary | ICD-10-CM | POA: Diagnosis not present

## 2023-10-16 DIAGNOSIS — M5459 Other low back pain: Secondary | ICD-10-CM | POA: Diagnosis not present

## 2023-10-16 DIAGNOSIS — G8929 Other chronic pain: Secondary | ICD-10-CM

## 2023-10-16 MED ORDER — OXYCODONE-ACETAMINOPHEN 5-325 MG PO TABS
2.0000 | ORAL_TABLET | Freq: Once | ORAL | Status: AC
  Administered 2023-10-16: 2 via ORAL
  Filled 2023-10-16: qty 2

## 2023-10-16 MED ORDER — OXYCODONE-ACETAMINOPHEN 5-325 MG PO TABS: 1.0000 | ORAL_TABLET | Freq: Three times a day (TID) | ORAL | 0 refills | Status: DC | PRN

## 2023-10-16 NOTE — ED Provider Notes (Signed)
 [place] EMERGENCY DEPARTMENT AT [facility] Provider Note   CSN: [id number] Arrival date & time: 10/16/23  0006     History  Chief Complaint  Patient presents with   Back Pain    Jordan May is a 69 y.o. male.  68 yo M w/ chronic back pain here with same but out of his pain medication. On review of the PDMP he should still have about a week or more left of his pain medication. He states he doesn't know where the extra week went, may have taken them early. No changes to his back pain. No new associated symptoms. Planning to follow up with pcp this week.    Back Pain      Home Medications Prior to Admission medications   Medication Sig Start Date End Date Taking? Authorizing Provider  oxyCODONE-acetaminophen  (PERCOCET/ROXICET) 5-325 MG tablet Take 1-2 tablets by mouth every 8 (eight) hours as needed for severe pain (pain score 7-10). 10/16/23  Yes Dwyane Dupree, Reymundo Caulk, MD  COLCRYS  0.6 MG tablet Take 0.6 mg by mouth 2 (two) times daily. As needed for  gout 02/27/19   [provider]  folic acid  (FOLVITE ) 1 MG tablet Take 1 tablet (1 mg total) by mouth daily. 05/15/21   Demaris Fillers, MD  HYDROcodone -acetaminophen  (NORCO) 10-325 MG per tablet Take 1 tablet by mouth every 6 (six) hours as needed. pain 01/31/14   [provider]  meropenem  2 g in sodium chloride  0.9 % 100 mL Inject 2 g into the vein every 12 (twelve) hours. Last dose on 05/20/21 05/14/21   Demaris Fillers, MD  sildenafil (REVATIO) 20 MG tablet Take 20 mg by mouth 2 (two) times daily. 06/06/19   [provider]  vancomycin  1,500 mg in sodium chloride  0.9 % 250 mL Inject 1,000 mg into the vein every other day.    [provider]  vitamin B-12 (CYANOCOBALAMIN ) 500 MCG tablet Take 1 tablet (500 mcg total) by mouth daily. 05/15/21   Demaris Fillers, MD      Allergies    Gabapentin , Penicillins, Oxycodone, and Penicillamine    Review of Systems   Review of Systems  Musculoskeletal:  Positive for  back pain.    Physical Exam Updated Vital Signs BP 126/76   Pulse 100   Temp 97.9 F (36.6 C) (Oral)   Resp 18   Ht 5\' 8"  (1.727 m)   Wt 78 kg   SpO2 90%   BMI 26.15 kg/m  Physical Exam Vitals and nursing note reviewed.  Constitutional:      Appearance: He is well-developed.  HENT:     Head: Normocephalic and atraumatic.  Cardiovascular:     Rate and Rhythm: Normal rate.  Pulmonary:     Effort: Pulmonary effort is normal. No respiratory distress.  Abdominal:     General: There is no distension.  Musculoskeletal:        General: Tenderness (mild lower t spine/upper l spine) present. Normal range of motion.     Cervical back: Normal range of motion.  Skin:    General: Skin is warm and dry.  Neurological:     Mental Status: He is alert.     ED Results / Procedures / Treatments   Labs (all labs ordered are listed, but only abnormal results are displayed) Labs Reviewed - No data to display  EKG None  Radiology No results found.  Procedures Procedures    Medications Ordered in ED Medications  oxyCODONE-acetaminophen  (PERCOCET/ROXICET) 5-325 MG per  tablet 2 tablet (2 tablets Oral Given 10/16/23 0250)    ED Course/ Medical Decision Making/ A&P                                 Medical Decision Making Risk Prescription drug management.   Discussed he needed to follow up with his prescribing doctor for a weeks worth of his chronic pain medications. Gave him a few doses to get to Monday. No changes to symptoms to suggest recurrent workup for his chronic issue.   Final Clinical Impression(s) / ED Diagnoses Final diagnoses:  Chronic low back pain, unspecified back pain laterality, unspecified whether sciatica present    Rx / DC Orders ED Discharge Orders          Ordered    oxyCODONE-acetaminophen  (PERCOCET/ROXICET) 5-325 MG tablet  Every 8 hours PRN        10/16/23 0243              Raeanna Soberanes, MD 10/16/23 [phone]

## 2023-10-16 NOTE — ED Triage Notes (Signed)
 Pt states he has chronic back pain in which he takes OxyContin tid and he ran out this morning. States he can't take the pain and needs relief.

## 2023-10-17 MED FILL — Oxycodone w/ Acetaminophen Tab 5-325 MG: ORAL | Qty: 6 | Status: AC

## 2023-10-21 DIAGNOSIS — J449 Chronic obstructive pulmonary disease, unspecified: Secondary | ICD-10-CM | POA: Diagnosis not present

## 2023-10-21 DIAGNOSIS — I5032 Chronic diastolic (congestive) heart failure: Secondary | ICD-10-CM | POA: Diagnosis not present

## 2023-10-21 DIAGNOSIS — Z6821 Body mass index (BMI) 21.0-21.9, adult: Secondary | ICD-10-CM | POA: Diagnosis not present

## 2023-10-21 DIAGNOSIS — G894 Chronic pain syndrome: Secondary | ICD-10-CM | POA: Diagnosis not present

## 2023-10-21 DIAGNOSIS — I10 Essential (primary) hypertension: Secondary | ICD-10-CM | POA: Diagnosis not present

## 2023-10-21 DIAGNOSIS — M5030 Other cervical disc degeneration, unspecified cervical region: Secondary | ICD-10-CM | POA: Diagnosis not present

## 2023-11-21 DIAGNOSIS — Z6821 Body mass index (BMI) 21.0-21.9, adult: Secondary | ICD-10-CM | POA: Diagnosis not present

## 2023-11-21 DIAGNOSIS — G894 Chronic pain syndrome: Secondary | ICD-10-CM | POA: Diagnosis not present

## 2023-11-21 DIAGNOSIS — M5030 Other cervical disc degeneration, unspecified cervical region: Secondary | ICD-10-CM | POA: Diagnosis not present

## 2023-11-21 DIAGNOSIS — I5032 Chronic diastolic (congestive) heart failure: Secondary | ICD-10-CM | POA: Diagnosis not present

## 2023-11-21 DIAGNOSIS — J449 Chronic obstructive pulmonary disease, unspecified: Secondary | ICD-10-CM | POA: Diagnosis not present

## 2023-11-21 DIAGNOSIS — I10 Essential (primary) hypertension: Secondary | ICD-10-CM | POA: Diagnosis not present

## 2023-12-15 ENCOUNTER — Emergency Department (HOSPITAL_COMMUNITY)

## 2023-12-15 ENCOUNTER — Encounter (HOSPITAL_COMMUNITY): Payer: Self-pay

## 2023-12-15 ENCOUNTER — Emergency Department (HOSPITAL_COMMUNITY)
Admission: EM | Admit: 2023-12-15 | Discharge: 2023-12-15 | Disposition: A | Discharge status: Home / Self Care | Attending: Emergency Medicine | Admitting: Emergency Medicine

## 2023-12-15 DIAGNOSIS — R531 Weakness: Secondary | ICD-10-CM | POA: Insufficient documentation

## 2023-12-15 DIAGNOSIS — R110 Nausea: Secondary | ICD-10-CM | POA: Insufficient documentation

## 2023-12-15 DIAGNOSIS — F1123 Opioid dependence with withdrawal: Secondary | ICD-10-CM | POA: Diagnosis not present

## 2023-12-15 DIAGNOSIS — F1193 Opioid use, unspecified with withdrawal: Secondary | ICD-10-CM

## 2023-12-15 DIAGNOSIS — F1113 Opioid abuse with withdrawal: Secondary | ICD-10-CM | POA: Insufficient documentation

## 2023-12-15 DIAGNOSIS — I6782 Cerebral ischemia: Secondary | ICD-10-CM | POA: Diagnosis not present

## 2023-12-15 DIAGNOSIS — R410 Disorientation, unspecified: Secondary | ICD-10-CM | POA: Diagnosis not present

## 2023-12-15 DIAGNOSIS — M549 Dorsalgia, unspecified: Secondary | ICD-10-CM | POA: Diagnosis not present

## 2023-12-15 DIAGNOSIS — R4182 Altered mental status, unspecified: Secondary | ICD-10-CM | POA: Diagnosis not present

## 2023-12-15 LAB — CBC WITH DIFFERENTIAL/PLATELET
Abs Immature Granulocytes: 0.02 10*3/uL (ref 0.00–0.07)
Basophils Absolute: 0.1 10*3/uL (ref 0.0–0.1)
Basophils Relative: 1 %
Eosinophils Absolute: 0.2 10*3/uL (ref 0.0–0.5)
Eosinophils Relative: 3 %
HCT: 41.6 % (ref 39.0–52.0)
Hemoglobin: 14 g/dL (ref 13.0–17.0)
Immature Granulocytes: 0 %
Lymphocytes Relative: 34 %
Lymphs Abs: 3 10*3/uL (ref 0.7–4.0)
MCH: 30.6 pg (ref 26.0–34.0)
MCHC: 33.7 g/dL (ref 30.0–36.0)
MCV: 91 fL (ref 80.0–100.0)
Monocytes Absolute: 1.2 10*3/uL — ABNORMAL HIGH (ref 0.1–1.0)
Monocytes Relative: 13 %
Neutro Abs: 4.5 10*3/uL (ref 1.7–7.7)
Neutrophils Relative %: 49 %
Platelets: 298 10*3/uL (ref 150–400)
RBC: 4.57 MIL/uL (ref 4.22–5.81)
RDW: 14.3 % (ref 11.5–15.5)
WBC: 9.1 10*3/uL (ref 4.0–10.5)
nRBC: 0 % (ref 0.0–0.2)

## 2023-12-15 LAB — BASIC METABOLIC PANEL WITH GFR
Anion gap: 11 (ref 5–15)
BUN: 10 mg/dL (ref 8–23)
CO2: 25 mmol/L (ref 22–32)
Calcium: 8.9 mg/dL (ref 8.9–10.3)
Chloride: 99 mmol/L (ref 98–111)
Creatinine, Ser: 1.2 mg/dL (ref 0.61–1.24)
GFR, Estimated: >60 mL/min (ref >60)
Glucose, Bld: 97 mg/dL (ref 70–99)
Potassium: 4 mmol/L (ref 3.5–5.1)
Sodium: 135 mmol/L (ref 135–145)

## 2023-12-15 LAB — AMMONIA: Ammonia: 13 umol/L (ref 9–35)

## 2023-12-15 MED ORDER — MORPHINE SULFATE (PF) 4 MG/ML IV SOLN: 4.0000 mg | Freq: Once | INTRAVENOUS | Status: DC

## 2023-12-15 MED ORDER — SODIUM CHLORIDE 0.9 % IV BOLUS
500.0000 mL | Freq: Once | INTRAVENOUS | Status: AC
  Administered 2023-12-15: 500 mL via INTRAVENOUS

## 2023-12-15 MED ORDER — OXYCODONE HCL 5 MG PO TABS
10.0000 mg | ORAL_TABLET | Freq: Once | ORAL | Status: AC
  Administered 2023-12-15: 10 mg via ORAL
  Filled 2023-12-15: qty 2

## 2023-12-15 MED ORDER — ONDANSETRON 4 MG PO TBDP: 4.0000 mg | ORAL_TABLET | Freq: Three times a day (TID) | ORAL | 0 refills | Status: AC | PRN

## 2023-12-15 MED ORDER — ONDANSETRON HCL 4 MG/2ML IJ SOLN: 4.0000 mg | Freq: Once | INTRAMUSCULAR | Status: DC

## 2023-12-15 NOTE — ED Triage Notes (Signed)
 Pt reports he has been taking oxycodone  for the last 3 years due to chronic back pain and he runs out of them and gets some off the streets and he has been out of them for about a week and his family feels like he is having withdrawal.

## 2023-12-15 NOTE — ED Provider Notes (Signed)
 [place] EMERGENCY DEPARTMENT AT [facility] Provider Note   CSN: [id number] Arrival date & time: 12/15/23  1522     Patient presents with: Withdrawal   Jordan May is a 68 y.o. male.  He has a history of chronic back pain and is on chronic oxycodone  that is prescribed by his PCP.  He said he ran out about a week ago.  Not due for another prescription for another 3 to 7 days.  Complaining of feeling sick, nauseous.  Worsening pain.  He denies any fevers or chills no vomiting diarrhea constipation or urinary symptoms.   The history is provided by the patient.  Weakness Severity:  Moderate Onset quality:  Gradual Timing:  Constant Progression:  Worsening Relieved by:  Nothing Worsened by:  Activity Ineffective treatments:  None tried Associated symptoms: nausea   Associated symptoms: no abdominal pain, no chest pain, no fever, no shortness of breath and no vomiting        Prior to Admission medications   Medication Sig Start Date End Date Taking? Authorizing Provider  COLCRYS  0.6 MG tablet Take 0.6 mg by mouth 2 (two) times daily. As needed for  gout 02/27/19   [provider]  folic acid  (FOLVITE ) 1 MG tablet Take 1 tablet (1 mg total) by mouth daily. 05/15/21   Evonnie Alm, MD  HYDROcodone -acetaminophen  (NORCO) 10-325 MG per tablet Take 1 tablet by mouth every 6 (six) hours as needed. pain 01/31/14   [provider]  meropenem  2 g in sodium chloride  0.9 % 100 mL Inject 2 g into the vein every 12 (twelve) hours. Last dose on 05/20/21 05/14/21   Evonnie Alm, MD  oxyCODONE -acetaminophen  (PERCOCET/ROXICET) 5-325 MG tablet Take 1-2 tablets by mouth every 8 (eight) hours as needed for severe pain (pain score 7-10). 10/16/23   Mesner, Jason, MD  sildenafil (REVATIO) 20 MG tablet Take 20 mg by mouth 2 (two) times daily. 06/06/19   [provider]  vancomycin  1,500 mg in sodium chloride  0.9 % 250 mL Inject 1,000 mg into the vein every other day.    [provider]  vitamin B-12 (CYANOCOBALAMIN ) 500 MCG tablet Take 1 tablet (500 mcg total) by mouth daily. 05/15/21   Evonnie Alm, MD    Allergies: Gabapentin , Penicillins, Oxycodone , and Penicillamine    Review of Systems  Constitutional:  Negative for fever.  Respiratory:  Negative for shortness of breath.   Cardiovascular:  Negative for chest pain.  Gastrointestinal:  Positive for nausea. Negative for abdominal pain and vomiting.  Neurological:  Positive for weakness.    Updated Vital Signs BP 112/66   Pulse 83   Temp 97.7 F (36.5 C) (Oral)   Resp 20   SpO2 90%   Physical Exam Vitals and nursing note reviewed.  Constitutional:      General: He is not in acute distress.    Appearance: Normal appearance. He is well-developed.  HENT:     Head: Normocephalic and atraumatic.  Eyes:     Conjunctiva/sclera: Conjunctivae normal.  Cardiovascular:     Rate and Rhythm: Normal rate and regular rhythm.     Heart sounds: No murmur heard. Pulmonary:     Effort: Pulmonary effort is normal. No respiratory distress.     Breath sounds: Normal breath sounds.  Abdominal:     Palpations: Abdomen is soft.     Tenderness: There is no abdominal tenderness. There is no guarding or rebound.  Musculoskeletal:  General: No deformity.     Cervical back: Neck supple.  Skin:    General: Skin is warm and dry.     Capillary Refill: Capillary refill takes less than 2 seconds.  Neurological:     General: No focal deficit present.     Mental Status: He is alert.     Motor: No weakness.     (all labs ordered are listed, but only abnormal results are displayed) Labs Reviewed  CBC WITH DIFFERENTIAL/PLATELET - Abnormal; Notable for the following components:      Result Value   Monocytes Absolute 1.2 (*)    All other components within normal limits  BASIC METABOLIC PANEL WITH GFR  AMMONIA    EKG: None  Radiology: CT Head Wo Contrast Result Date: 12/15/2023 CLINICAL DATA:  Mental  status change, possible withdrawal. EXAM: CT HEAD WITHOUT CONTRAST TECHNIQUE: Contiguous axial images were obtained from the base of the skull through the vertex without intravenous contrast. RADIATION DOSE REDUCTION: This exam was performed according to the departmental dose-optimization program which includes automated exposure control, adjustment of the mA and/or kV according to patient size and/or use of iterative reconstruction technique. COMPARISON:  MRI head 05/13/2021. FINDINGS: Brain: No acute intracranial hemorrhage. No CT evidence of acute infarct. Nonspecific hypoattenuation in the periventricular and subcortical white matter favored to reflect chronic microvascular ischemic changes. No edema, mass effect, or midline shift. The basilar cisterns are patent. Ventricles: The ventricles are normal. Vascular: No hyperdense vessel or unexpected calcification. Skull: No acute or aggressive finding. Orbits: Orbits are symmetric. Sinuses: The visualized paranasal sinuses are clear. Other: Mastoid air cells are clear.  Nasal septal deviation. IMPRESSION: No CT evidence of acute intracranial abnormality. Chronic microvascular ischemic changes. Electronically Signed   By: Donnice Mania M.D.   On: 12/15/2023 17:09     Procedures   Medications Ordered in the ED  morphine  (PF) 4 MG/ML injection 4 mg (has no administration in time range)  ondansetron  (ZOFRAN ) injection 4 mg (has no administration in time range)  sodium chloride  0.9 % bolus 500 mL (has no administration in time range)    Clinical Course as of 12/16/23 1006  Thu Dec 15, 2023  1828 Patient feeling better after medication.  He is asking to be discharged.  I recommended that he contact his primary care doctor tomorrow to discuss his narcotic use. [MB]    Clinical Course User Index [MB] Towana Ozell BROCKS, MD                                 Medical Decision Making Amount and/or Complexity of Data Reviewed Labs: ordered. Radiology:  ordered.  Risk Prescription drug management.   This patient complains of nausea and feeling generally bad after running out of his narcotics; this involves an extensive number of treatment Options and is a complaint that carries with it a high risk of complications and morbidity. The differential includes withdrawal, infection, metabolic derangement  I ordered, reviewed and interpreted labs, which included CBC and chemistries normal, urinalysis ordered but not obtained I ordered medication fluids and pain medicine and reviewed PMP when indicated. I ordered imaging studies which included head CT and I independently    visualized and interpreted imaging which showed no acute findings Additional history obtained from patient's family members Previous records obtained and reviewed in epic including recent PCP and ED visits  Cardiac monitoring reviewed, sinus rhythm Social determinants considered, tobacco  use, physically inactive Critical Interventions: None  After the interventions stated above, I reevaluated the patient and found patient to be feeling better and tolerating p.o. Admission and further testing considered, no indications for admission or further workup at this time.  Patient asking to be discharged.  He understands he needs to reach out to his primary care doctor to discuss further narcotic treatment versus tapering.  Return instructions discussed      Final diagnoses:  Acute narcotic withdrawal [facility])    ED Discharge Orders     None          Towana Ozell BROCKS, MD 12/16/23 1008

## 2023-12-15 NOTE — ED Notes (Signed)
 Pt and family state pt is allergic to Morphine . Nurse voiced pt also has an allergy noted to oxy. Family states  [name] says it causes hallucinations but he has been taking it over a year daily.

## 2023-12-15 NOTE — Discharge Instructions (Addendum)
 Call Dr. Thomasine office tomorrow to see if he has any recommendations on how to taper your narcotics.

## 2023-12-21 DIAGNOSIS — F4542 Pain disorder with related psychological factors: Secondary | ICD-10-CM | POA: Diagnosis not present

## 2023-12-21 DIAGNOSIS — G894 Chronic pain syndrome: Secondary | ICD-10-CM | POA: Diagnosis not present

## 2023-12-21 DIAGNOSIS — I5032 Chronic diastolic (congestive) heart failure: Secondary | ICD-10-CM | POA: Diagnosis not present

## 2023-12-21 DIAGNOSIS — M109 Gout, unspecified: Secondary | ICD-10-CM | POA: Diagnosis not present

## 2023-12-21 DIAGNOSIS — M720 Palmar fascial fibromatosis [Dupuytren]: Secondary | ICD-10-CM | POA: Diagnosis not present

## 2023-12-21 DIAGNOSIS — I10 Essential (primary) hypertension: Secondary | ICD-10-CM | POA: Diagnosis not present

## 2023-12-21 DIAGNOSIS — M5030 Other cervical disc degeneration, unspecified cervical region: Secondary | ICD-10-CM | POA: Diagnosis not present

## 2023-12-21 DIAGNOSIS — Z6820 Body mass index (BMI) 20.0-20.9, adult: Secondary | ICD-10-CM | POA: Diagnosis not present

## 2023-12-21 DIAGNOSIS — J449 Chronic obstructive pulmonary disease, unspecified: Secondary | ICD-10-CM | POA: Diagnosis not present

## 2024-01-12 DIAGNOSIS — M109 Gout, unspecified: Secondary | ICD-10-CM | POA: Diagnosis not present

## 2024-01-12 DIAGNOSIS — J449 Chronic obstructive pulmonary disease, unspecified: Secondary | ICD-10-CM | POA: Diagnosis not present

## 2024-01-26 DIAGNOSIS — H6120 Impacted cerumen, unspecified ear: Secondary | ICD-10-CM | POA: Diagnosis not present

## 2024-01-26 DIAGNOSIS — J029 Acute pharyngitis, unspecified: Secondary | ICD-10-CM | POA: Diagnosis not present

## 2024-01-26 DIAGNOSIS — Z6820 Body mass index (BMI) 20.0-20.9, adult: Secondary | ICD-10-CM | POA: Diagnosis not present

## 2024-02-16 DIAGNOSIS — H6121 Impacted cerumen, right ear: Secondary | ICD-10-CM | POA: Diagnosis not present

## 2024-02-16 DIAGNOSIS — M79605 Pain in left leg: Secondary | ICD-10-CM | POA: Diagnosis not present

## 2024-02-16 DIAGNOSIS — M79604 Pain in right leg: Secondary | ICD-10-CM | POA: Diagnosis not present

## 2024-02-16 DIAGNOSIS — J449 Chronic obstructive pulmonary disease, unspecified: Secondary | ICD-10-CM | POA: Diagnosis not present

## 2024-02-16 DIAGNOSIS — D649 Anemia, unspecified: Secondary | ICD-10-CM | POA: Diagnosis not present

## 2024-02-16 DIAGNOSIS — E785 Hyperlipidemia, unspecified: Secondary | ICD-10-CM | POA: Diagnosis not present

## 2024-02-16 DIAGNOSIS — F17210 Nicotine dependence, cigarettes, uncomplicated: Secondary | ICD-10-CM | POA: Diagnosis not present

## 2024-03-07 DIAGNOSIS — C4441 Basal cell carcinoma of skin of scalp and neck: Secondary | ICD-10-CM | POA: Diagnosis not present

## 2024-03-07 DIAGNOSIS — Z08 Encounter for follow-up examination after completed treatment for malignant neoplasm: Secondary | ICD-10-CM | POA: Diagnosis not present

## 2024-03-07 DIAGNOSIS — C44622 Squamous cell carcinoma of skin of right upper limb, including shoulder: Secondary | ICD-10-CM | POA: Diagnosis not present

## 2024-03-07 DIAGNOSIS — Z85828 Personal history of other malignant neoplasm of skin: Secondary | ICD-10-CM | POA: Diagnosis not present

## 2024-03-29 ENCOUNTER — Emergency Department (HOSPITAL_COMMUNITY)
Admission: EM | Admit: 2024-03-29 | Discharge: 2024-03-29 | Discharge status: Against Medical Advice | Source: Home / Self Care

## 2024-04-23 DIAGNOSIS — E538 Deficiency of other specified B group vitamins: Secondary | ICD-10-CM | POA: Diagnosis not present

## 2024-04-23 DIAGNOSIS — E782 Mixed hyperlipidemia: Secondary | ICD-10-CM | POA: Diagnosis not present
